# Patient Record
Sex: Female | Born: 1990 | Race: Black or African American | Hispanic: No | Marital: Married | Smoking: Never smoker
Health system: Southern US, Community
[De-identification: ages and names within clinical notes are randomized; demographics above are authoritative.]

## PROBLEM LIST (undated history)

## (undated) DIAGNOSIS — R51 Headache: Secondary | ICD-10-CM

## (undated) DIAGNOSIS — R519 Headache, unspecified: Secondary | ICD-10-CM

## (undated) DIAGNOSIS — M6208 Separation of muscle (nontraumatic), other site: Secondary | ICD-10-CM

## (undated) DIAGNOSIS — A64 Unspecified sexually transmitted disease: Secondary | ICD-10-CM

## (undated) HISTORY — DX: Separation of muscle (nontraumatic), other site: M62.08

## (undated) HISTORY — PX: NO PAST SURGERIES: SHX2092

## (undated) HISTORY — DX: Unspecified sexually transmitted disease: A64

---

## 2017-05-16 DIAGNOSIS — A64 Unspecified sexually transmitted disease: Secondary | ICD-10-CM | POA: Insufficient documentation

## 2017-05-16 HISTORY — DX: Unspecified sexually transmitted disease: A64

## 2017-05-16 LAB — OB RESULTS CONSOLE HEPATITIS B SURFACE ANTIGEN: Hepatitis B Surface Ag: NEGATIVE

## 2017-05-16 LAB — OB RESULTS CONSOLE ABO/RH: RH TYPE: POSITIVE

## 2017-05-16 LAB — OB RESULTS CONSOLE GC/CHLAMYDIA
Chlamydia: POSITIVE
Gonorrhea: NEGATIVE

## 2017-05-16 LAB — OB RESULTS CONSOLE RPR: RPR: NONREACTIVE

## 2017-05-16 LAB — OB RESULTS CONSOLE HIV ANTIBODY (ROUTINE TESTING): HIV: NONREACTIVE

## 2017-05-16 LAB — OB RESULTS CONSOLE ANTIBODY SCREEN: Antibody Screen: NEGATIVE

## 2017-05-16 LAB — OB RESULTS CONSOLE RUBELLA ANTIBODY, IGM: RUBELLA: IMMUNE

## 2017-07-14 ENCOUNTER — Ambulatory Visit (INDEPENDENT_AMBULATORY_CARE_PROVIDER_SITE_OTHER): Payer: BLUE CROSS/BLUE SHIELD | Admitting: Certified Nurse Midwife

## 2017-07-14 ENCOUNTER — Encounter: Payer: Self-pay | Admitting: Certified Nurse Midwife

## 2017-07-14 VITALS — BP 108/72 | HR 61 | Ht 66.0 in | Wt 157.7 lb

## 2017-07-14 DIAGNOSIS — Z8619 Personal history of other infectious and parasitic diseases: Secondary | ICD-10-CM

## 2017-07-14 DIAGNOSIS — Z3402 Encounter for supervision of normal first pregnancy, second trimester: Secondary | ICD-10-CM

## 2017-07-14 LAB — POCT URINALYSIS DIPSTICK
BILIRUBIN UA: NEGATIVE
Blood, UA: NEGATIVE
GLUCOSE UA: NEGATIVE
KETONES UA: NEGATIVE
Leukocytes, UA: NEGATIVE
Nitrite, UA: NEGATIVE
Protein, UA: NEGATIVE
Urobilinogen, UA: 0.2 E.U./dL
pH, UA: 7.5 (ref 5.0–8.0)

## 2017-07-14 NOTE — Progress Notes (Signed)
TRANSFER IN OB HISTORY AND PHYSICAL  SUBJECTIVE:       Tara Baldwin is a 26 y.o. No obstetric history on file. female, Patient's last menstrual period was 02/06/2017., Estimated Date of Delivery: None noted., Unknown, presents today for Transition of Prenatal Care. EPIC data migration from outside records is not accomplished today. Pt informed to sign release of medical records.  Complaints today include: none      Gynecologic History Patient's last menstrual period was 02/06/2017. Normal , pt has history of irregular cycles q 6 wks.  Contraception: none Last Pap: 2017. Results were: normal, per pt. No records on file   Obstetric History OB History  Gravida Para Term Preterm AB Living  1         0  SAB TAB Ectopic Multiple Live Births          0    # Outcome Date GA Lbr Len/2nd Weight Sex Delivery Anes PTL Lv  1 Current               Past Medical History:  Diagnosis Date  . STD (female) 05/16/2017   chlmydia   She denies any significant medical history   History reviewed. No pertinent surgical history.  No current outpatient prescriptions on file prior to visit.   No current facility-administered medications on file prior to visit.     No Known Allergies  Social History   Social History  . Marital status: Single    Spouse name: N/A  . Number of children: N/A  . Years of education: N/A   Occupational History  . Not on file.   Social History Main Topics  . Smoking status: Never Smoker  . Smokeless tobacco: Never Used  . Alcohol use No  . Drug use: No  . Sexual activity: Yes   Other Topics Concern  . Not on file   Social History Narrative  . No narrative on file    Family History  Problem Relation Age of Onset  . Cancer Paternal Grandmother        Breast  . Diabetes Neg Hx     The following portions of the patient's history were reviewed and updated as appropriate: allergies, current medications, past OB history, past medical history, past  surgical history, past family history, past social history, and problem list.    OBJECTIVE: Initial Physical Exam (New OB)  GENERAL APPEARANCE: alert, well appearing, in no apparent distress, oriented to person, place and time HEAD: normocephalic, atraumatic MOUTH: mucous membranes moist, pharynx normal without lesions THYROID: no thyromegaly or masses present BREASTS: no masses noted, no significant tenderness, no palpable axillary nodes, no skin changes LUNGS: clear to auscultation, no wheezes, rales or rhonchi, symmetric air entry HEART: regular rate and rhythm, no murmurs ABDOMEN: soft, nontender, nondistended, no abnormal masses, no epigastric pain and fundus soft, nontender just below umbilicus EXTREMITIES: no redness or tenderness in the calves or thighs, no edema, no limitation in range of motion, intact peripheral pulses SKIN: normal coloration and turgor, no rashes LYMPH NODES: no adenopathy palpable NEUROLOGIC: alert, oriented, normal speech, no focal findings or movement disorder noted  PELVIC EXAM EXTERNAL GENITALIA: normal appearing vulva with no masses, tenderness or lesions VAGINA: No speculum exam  CERVIX: cervical motion tenderness UTERUS: gravid ADNEXA: no masses palpable and nontender OB EXAM PELVIMETRY: appears adequate RECTUM: exam not indicated  ASSESSMENT: Normal pregnancy  PLAN: Pt states at one ultrasound done in September she was told that she was further along and  that by that estimate she was 19 wks. Ultrasound for dating ordered. Requesting medical records. She is feeling fetal movement. Fundus just below umbilicus. She will follow up for U/s this week . ROB in 4 wks.   Doreene Burke, CNM

## 2017-07-14 NOTE — Progress Notes (Signed)
New OB transfer from [place], declines flu vaccine, last pap 2017 wnl, positive for chlamydia in August of 2018 and treated in August 2018, [facility] Negative Sept 2018

## 2017-07-14 NOTE — Patient Instructions (Addendum)
Prenatal Care WHAT IS PRENATAL CARE? Prenatal care is the process of caring for a pregnant woman before she gives birth. Prenatal care makes sure that she and her baby remain as healthy as possible throughout pregnancy. Prenatal care may be provided by a midwife, family practice health care provider, or a childbirth and pregnancy specialist (obstetrician). Prenatal care may include physical examinations, testing, treatments, and education on nutrition, lifestyle, and social support services. WHY IS PRENATAL CARE SO IMPORTANT? Early and consistent prenatal care increases the chance that you and your baby will remain healthy throughout your pregnancy. This type of care also decreases a baby's risk of being born too early (prematurely), or being born smaller than expected (small for gestational age). Any underlying medical conditions you may have that could pose a risk during your pregnancy are discussed during prenatal care visits. You will also be monitored regularly for any new conditions that may arise during your pregnancy so they can be treated quickly and effectively. WHAT HAPPENS DURING PRENATAL CARE VISITS? Prenatal care visits may include the following: Discussion Tell your health care provider about any new signs or symptoms you have experienced since your last visit. These might include:  Nausea or vomiting.  Increased or decreased level of energy.  Difficulty sleeping.  Back or leg pain.  Weight changes.  Frequent urination.  Shortness of breath with physical activity.  Changes in your skin, such as the development of a rash or itchiness.  Vaginal discharge or bleeding.  Feelings of excitement or nervousness.  Changes in your baby's movements.  You may want to write down any questions or topics you want to discuss with your health care provider and bring them with you to your appointment. Examination During your first prenatal care visit, you will likely have a complete  physical exam. Your health care provider will often examine your vagina, cervix, and the position of your uterus, as well as check your heart, lungs, and other body systems. As your pregnancy progresses, your health care provider will measure the size of your uterus and your baby's position inside your uterus. He or she may also examine you for early signs of labor. Your prenatal visits may also include checking your blood pressure and, after about 10-12 weeks of pregnancy, listening to your baby's heartbeat. Testing Regular testing often includes:  Urinalysis. This checks your urine for glucose, protein, or signs of infection.  Blood count. This checks the levels of white and red blood cells in your body.  Tests for sexually transmitted infections (STIs). Testing for STIs at the beginning of pregnancy is routinely done and is required in many states.  Antibody testing. You will be checked to see if you are immune to certain illnesses, such as rubella, that can affect a developing fetus.  Glucose screen. Around 24-28 weeks of pregnancy, your blood glucose level will be checked for signs of gestational diabetes. Follow-up tests may be recommended.  Group B strep. This is a bacteria that is commonly found inside a woman's vagina. This test will inform your health care provider if you need an antibiotic to reduce the amount of this bacteria in your body prior to labor and childbirth.  Ultrasound. Many pregnant women undergo an ultrasound screening around 18-20 weeks of pregnancy to evaluate the health of the fetus and check for any developmental abnormalities.  HIV (human immunodeficiency virus) testing. Early in your pregnancy, you will be screened for HIV. If you are at high risk for HIV, this test may   be repeated during your third trimester of pregnancy.  You may be offered other testing based on your age, personal or family medical history, or other factors. HOW OFTEN SHOULD I PLAN TO SEE MY  HEALTH CARE PROVIDER FOR PRENATAL CARE? Your prenatal care check-up schedule depends on any medical conditions you have before, or develop during, your pregnancy. If you do not have any underlying medical conditions, you will likely be seen for checkups:  Monthly, during the first 6 months of pregnancy.  Twice a month during months 7 and 8 of pregnancy.  Weekly starting in the 9th month of pregnancy and until delivery.  If you develop signs of early labor or other concerning signs or symptoms, you may need to see your health care provider more often. Ask your health care provider what prenatal care schedule is best for you. WHAT CAN I DO TO KEEP MYSELF AND MY BABY AS HEALTHY AS POSSIBLE DURING MY PREGNANCY?  Take a prenatal vitamin containing 400 micrograms (0.4 mg) of folic acid every day. Your health care provider may also ask you to take additional vitamins such as iodine, vitamin D, iron, copper, and zinc.  Take 1500-2000 mg of calcium daily starting at your 20th week of pregnancy until you deliver your baby.  Make sure you are up to date on your vaccinations. Unless directed otherwise by your health care provider: ? You should receive a tetanus, diphtheria, and pertussis (Tdap) vaccination between the 27th and 36th week of your pregnancy, regardless of when your last Tdap immunization occurred. This helps protect your baby from whooping cough (pertussis) after he or she is born. ? You should receive an annual inactivated influenza vaccine (IIV) to help protect you and your baby from influenza. This can be done at any point during your pregnancy.  Eat a well-rounded diet that includes: ? Fresh fruits and vegetables. ? Lean proteins. ? Calcium-rich foods such as milk, yogurt, hard cheeses, and dark, leafy greens. ? Whole grain breads.  Do noteat seafood high in mercury, including: ? Swordfish. ? Tilefish. ? Shark. ? King mackerel. ? More than 6 oz tuna per week.  Do not  eat: ? Raw or undercooked meats or eggs. ? Unpasteurized foods, such as soft cheeses (brie, blue, or feta), juices, and milks. ? Lunch meats. ? Hot dogs that have not been heated until they are steaming.  Drink enough water to keep your urine clear or pale yellow. For many women, this may be 10 or more 8 oz glasses of water each day. Keeping yourself hydrated helps deliver nutrients to your baby and may prevent the start of pre-term uterine contractions.  Do not use any tobacco products including cigarettes, chewing tobacco, or electronic cigarettes. If you need help quitting, ask your health care provider.  Do not drink beverages containing alcohol. No safe level of alcohol consumption during pregnancy has been determined.  Do not use any illegal drugs. These can harm your developing baby or cause a miscarriage.  Ask your health care provider or pharmacist before taking any prescription or over-the-counter medicines, herbs, or supplements.  Limit your caffeine intake to no more than 200 mg per day.  Exercise. Unless told otherwise by your health care provider, try to get 30 minutes of moderate exercise most days of the week. Do not  do high-impact activities, contact sports, or activities with a high risk of falling, such as horseback riding or downhill skiing.  Get plenty of rest.  Avoid anything that raises your  body temperature, such as hot tubs and saunas.  If you own a cat, do not empty its litter box. Bacteria contained in cat feces can cause an infection called toxoplasmosis. This can result in serious harm to the fetus.  Stay away from chemicals such as insecticides, lead, mercury, and cleaning or paint products that contain solvents.  Do not have any X-rays taken unless medically necessary.  Take a childbirth and breastfeeding preparation class. Ask your health care provider if you need a referral or recommendation.  This information is not intended to replace advice given  to you by your health care provider. Make sure you discuss any questions you have with your health care provider. Document Released: 09/26/2003 Document Revised: 02/26/2016 Document Reviewed: 12/08/2013 Elsevier Interactive Patient Education  2017 [employer] Common Medications Safe in Pregnancy  Acne:      Constipation:  Benzoyl Peroxide     Colace  Clindamycin      Dulcolax Suppository  Topica Erythromycin     Fibercon  Salicylic Acid      Metamucil         Miralax AVOID:        Senakot   Accutane    Cough:  Retin-A       Cough Drops  Tetracycline      Phenergan w/ Codeine if Rx  Minocycline      Robitussin (Plain & DM)  Antibiotics:     Crabs/Lice:  Ceclor       RID  Cephalosporins    AVOID:  E-Mycins      Kwell  Keflex  Macrobid/Macrodantin   Diarrhea:  Penicillin      Kao-Pectate  Zithromax      Imodium AD         PUSH FLUIDS AVOID:       Cipro     Fever:  Tetracycline      Tylenol (Regular or Extra  Minocycline       Strength)  Levaquin      Extra Strength-Do not          Exceed 8 tabs/24 hrs Caffeine:        <200mg/day (equiv. To 1 cup of coffee or  approx. 3 12 oz sodas)         Gas: Cold/Hayfever:       Gas-X  Benadryl      Mylicon  Claritin       Phazyme  **Claritin-D        Chlor-Trimeton    Headaches:  Dimetapp      ASA-Free Excedrin  Drixoral-Non-Drowsy     Cold Compress  Mucinex (Guaifenasin)     Tylenol (Regular or Extra  Sudafed/Sudafed-12 Hour     Strength)  **Sudafed PE Pseudoephedrine   Tylenol Cold & Sinus     Vicks Vapor Rub  Zyrtec  **AVOID if Problems With Blood Pressure         Heartburn: Avoid lying down for at least 1 hour after meals  Aciphex      Maalox     Rash:  Milk of Magnesia     Benadryl    Mylanta       1% Hydrocortisone Cream  Pepcid  Pepcid Complete   Sleep Aids:  Prevacid      Ambien   Prilosec       Benadryl  Rolaids       Chamomile Tea  Tums (Limit 4/day)     Unisom  Zantac         Tylenol PM         Warm  milk-add vanilla or  Hemorrhoids:       Sugar for taste  Anusol/Anusol H.C.  (RX: Analapram 2.5%)  Sugar Substitutes:  Hydrocortisone OTC     Ok in moderation  Preparation H      Tucks        Vaseline lotion applied to tissue with wiping    Herpes:     Throat:  Acyclovir      Oragel  Famvir  Valtrex     Vaccines:         Flu Shot Leg Cramps:       *Gardasil  Benadryl      Hepatitis A         Hepatitis B Nasal Spray:       Pneumovax  Saline Nasal Spray     Polio Booster         Tetanus Nausea:       Tuberculosis test or PPD  Vitamin B6 25 mg TID   AVOID:    Dramamine      *Gardasil  Emetrol       Live Poliovirus  Ginger Root 250 mg QID    MMR (measles, mumps &  High Complex Carbs @ Bedtime    rebella)  Sea Bands-Accupressure    Varicella (Chickenpox)  Unisom 1/2 tab TID     *No known complications           If received before Pain:         Known pregnancy;   Darvocet       Resume series after  Lortab        Delivery  Percocet    Yeast:   Tramadol      Femstat  Tylenol 3      Gyne-lotrimin  Ultram       Monistat  Vicodin           MISC:         All Sunscreens           Hair Coloring/highlights          Insect Repellant's          (Including DEET)         [facility] wWaterbirth Class  March 19, 2017  Wednesday 7:00p - 9:00p  [place], [place]  April 23, 2017  Wednesday 7:00p - 9:00p [place], [place]    May 28, 2017   Wednesday [place], [place]  June 25, 2017  Wednesday  7:00p - 9:00p [place], [place]  July 23, 2017 Wednesday [place], [place]  Interested in a waterbirth?  This informational class will help you discover whether waterbirth is the right fit for you.  Education about waterbirth itself, supplies you would need and how to assemble your support team is what  you can expect from this class.  Some obstetrical practices require this class in order to pursue a waterbirth.  (Not all obstetrical practices offer waterbirth check with your healthcare provider)  Register only the expectant mom, but you are encouraged to bring your partner to class!  Fees & Payment No fee  Register Online [email or web address] Search Doren Custard

## 2017-07-15 ENCOUNTER — Ambulatory Visit (INDEPENDENT_AMBULATORY_CARE_PROVIDER_SITE_OTHER): Payer: BLUE CROSS/BLUE SHIELD

## 2017-07-15 DIAGNOSIS — Z3402 Encounter for supervision of normal first pregnancy, second trimester: Secondary | ICD-10-CM | POA: Diagnosis not present

## 2017-07-16 LAB — GC/CHLAMYDIA PROBE AMP
Chlamydia trachomatis, NAA: NEGATIVE
NEISSERIA GONORRHOEAE BY PCR: NEGATIVE

## 2017-07-30 ENCOUNTER — Encounter: Payer: Self-pay | Admitting: Certified Nurse Midwife

## 2017-07-30 ENCOUNTER — Ambulatory Visit (INDEPENDENT_AMBULATORY_CARE_PROVIDER_SITE_OTHER): Payer: BLUE CROSS/BLUE SHIELD | Admitting: Certified Nurse Midwife

## 2017-07-30 VITALS — BP 106/63 | HR 69 | Wt 163.4 lb

## 2017-07-30 DIAGNOSIS — Z3402 Encounter for supervision of normal first pregnancy, second trimester: Secondary | ICD-10-CM

## 2017-07-30 LAB — POCT URINALYSIS DIPSTICK
Bilirubin, UA: NEGATIVE
GLUCOSE UA: NEGATIVE
Ketones, UA: NEGATIVE
LEUKOCYTES UA: NEGATIVE
Nitrite, UA: NEGATIVE
PROTEIN UA: NEGATIVE
SPEC GRAV UA: 1.02 (ref 1.010–1.025)
UROBILINOGEN UA: 0.2 U/dL
pH, UA: 6 (ref 5.0–8.0)

## 2017-07-30 NOTE — Progress Notes (Signed)
Pt presents today for problem visit. She states that she started to experience abdominal pain yesterday afternoon around 2 pm. Initially the pain was at the top of her stomach, she states that it felt like she was having a had time food settling and slow to go down but this morning she was having sharp right lower quadrant pain that radiated to her back. She denies Nausea, vomiting, fever, epigastric pain. She denies having pain after eating, her abdomen palpates soft, negative for CVA tenderness and she denies contractions and endorses good fetal movement.  We reviewed indigestion and round ligament pain. Discussed self help measures and tylenol. She verbalizes understanding and agrees to plan. Red flag symptoms reviewed. Follow up as scheduled Nov. 6rh.   Doreene BurkeAnnie Kagan Hietpas, CNM

## 2017-07-30 NOTE — Progress Notes (Signed)
ROB- Pt here today c/o sharp pain on her upper abdominal area, states it would come and go, sx started yesterday, she states it subsided last night but she woke up this am abdominal pain is now on her lower right side extending to back, Denies vaginal bleeding, pelvic pain or pressure

## 2017-07-30 NOTE — Patient Instructions (Signed)

## 2017-08-12 ENCOUNTER — Ambulatory Visit (INDEPENDENT_AMBULATORY_CARE_PROVIDER_SITE_OTHER): Payer: BLUE CROSS/BLUE SHIELD | Admitting: Obstetrics and Gynecology

## 2017-08-12 VITALS — BP 110/56 | HR 61 | Wt 167.5 lb

## 2017-08-12 DIAGNOSIS — Z3492 Encounter for supervision of normal pregnancy, unspecified, second trimester: Secondary | ICD-10-CM

## 2017-08-12 NOTE — Progress Notes (Signed)
ROB- doing well. Flu vaccine given.

## 2017-08-12 NOTE — Progress Notes (Signed)
ROB- pt is doing well 

## 2017-08-19 NOTE — Progress Notes (Signed)
Medical records received from [facility] , [place] . 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   To be scanned into chart.  Doreene BurkeAnnie Suzetta Baldwin, CNM

## 2017-09-11 ENCOUNTER — Ambulatory Visit (INDEPENDENT_AMBULATORY_CARE_PROVIDER_SITE_OTHER): Payer: BLUE CROSS/BLUE SHIELD | Admitting: Certified Nurse Midwife

## 2017-09-11 ENCOUNTER — Encounter: Payer: Self-pay | Admitting: Certified Nurse Midwife

## 2017-09-11 ENCOUNTER — Other Ambulatory Visit: Payer: Self-pay

## 2017-09-11 VITALS — BP 106/59 | HR 55 | Wt 175.3 lb

## 2017-09-11 DIAGNOSIS — Z130 Encounter for screening for diseases of the blood and blood-forming organs and certain disorders involving the immune mechanism: Secondary | ICD-10-CM

## 2017-09-11 DIAGNOSIS — Z113 Encounter for screening for infections with a predominantly sexual mode of transmission: Secondary | ICD-10-CM

## 2017-09-11 DIAGNOSIS — Z131 Encounter for screening for diabetes mellitus: Secondary | ICD-10-CM

## 2017-09-11 DIAGNOSIS — Z3492 Encounter for supervision of normal pregnancy, unspecified, second trimester: Secondary | ICD-10-CM

## 2017-09-11 NOTE — Patient Instructions (Signed)
Cord Blood Banking Information Cord blood banking is the process of collecting and storing the blood that is in the umbilical cord and placenta at the time of delivery. This blood contains stem cells, which can be used to treat many blood diseases, immune system disorders, and childhood cancers. Stem cells can also be used to research certain diseases and treatments. Many people who choose cord blood banking donate the blood. Donated blood can be used in lifesaving treatments or for research. Other people choose to store the blood privately. Blood that is stored privately can only be used with the person's permission. This option is often chosen if:  A family member needs a stem cell transplant.  The child is part of an ethnic minority.  The child was conceived through in vitro fertilization.  What should I look for in a blood bank? A blood bank is the organization that coordinates cord blood banking. Make sure the cord blood bank that you use:  Is accredited.  Is financially stable.  Handles a large volume of cord blood samples.  Has a procedure in place for transport and storage.  Allows you the option of transferring your cord blood sample.  Has a procedure in place if the bank goes out of business.  Clearly states all costs and limits to future costs.  People who choose to donate cord blood should not need to pay for blood banking. People who keep the blood for private use will need to pay for the first (initial) storage and pay a fee each year (annual fee). Other fees may also apply. What are the risks of cord blood banking? There are no health risks associated with cord blood banking. It is considered safe. How should I prepare? You must schedule this process at least 4-6 weeks before you will be giving birth. How is the blood collected? The blood is collected as soon as the baby has been delivered. Within 15 minutes of delivery, a health care provider will take these actions  to collect the blood:  Clamp the umbilical cord at the top and bottom. This traps the blood in the umbilical cord.  Use a syringe or bag to collect the blood.  Insert needles into the placenta to collect (draw out) more blood.  What happens after the blood is collected? After the blood has been collected:  The blood will be sent to a blood bank.  The blood will be tested for genetic problems and infectious diseases. If the blood tests positive for a genetic problem or a disease, someone will contact you and let you know.  The blood will be frozen.  If your child develops a genetic condition, immune system disorder, or cancer, you will be responsible for contacting the blood bank and letting them know. This information is not intended to replace advice given to you by your health care provider. Make sure you discuss any questions you have with your health care provider. Document Released: 03/13/2010 Document Revised: 02/29/2016 Document Reviewed: 03/13/2015 Elsevier Interactive Patient Education  2018 [employer]. Pain Relief During Labor and Delivery Many things can cause pain during labor and delivery, including:  Pressure on bones and ligaments due to the baby moving through the pelvis.  Stretching of tissues due to the baby moving through the birth canal.  Muscle tension due to anxiety or nervousness.  The uterus tightening (contracting) and relaxing to help move the baby.  There are many ways to deal with the pain of labor and delivery. They  include:  Taking prenatal classes. Taking these classes helps you know what to expect during your baby's birth. What you learn will increase your confidence and decrease your anxiety.  Practicing relaxation techniques or doing relaxing activities, such as: ? Focused breathing. ? Meditation. ? Visualization. ? Aroma therapy. ? Listening to your favorite music. ? Hypnosis.  Taking a warm shower or bath (hydrotherapy). This  may: ? Provide comfort and relaxation. ? Lessen your perception of pain. ? Decrease the amount of pain medicine needed. ? Decrease the length of labor.  Getting a massage or counterpressure on your back.  Applying warm packs or ice packs.  Changing positions often, moving around, or using a birthing ball.  Getting: ? Pain medicine through an IV or injection into a muscle. ? Pain medicine inserted into your spinal column. ? Injections of sterile water just under the skin on your lower back (intradermal injections). ? Laughing gas (nitrous oxide).  Discuss your pain control options with your health care provider during your prenatal visits. Explore the options offered by your hospital or birth center. What kinds of medicine are available? There are two kinds of medicines that can be used to relieve pain during labor and delivery:  Analgesics. These medicines decrease pain without causing you to lose feeling or the ability to move your muscles.  Anesthetics. These medicines block feeling in the body and can decrease your ability to move freely.  Both of these kinds of medicine can cause minor side effects, such as nausea, trouble concentrating, and sleepiness. They can also decrease the baby's heart rate before birth and affect the baby's breathing rate after birth. For this reason, health care providers are careful about when and how much medicine is given. What are specific medicines and procedures that provide pain relief? Local Anesthetics Local anesthetics are used to numb a small area of the body. They may be used along with another kind of anesthetic or used to numb the nerves of the vagina, cervix, and perineum during the second stage of labor. General Anesthetics General anesthetics cause you to lose consciousness so you do not feel pain. They are usually only used for an emergency cesarean delivery. General anesthetics are given through an IV tube and a mask. Pudendal Block A  pudendal block is a form of local anesthetic. It may be used to relieve the pain associated with pushing or stretching of the perineum at the time of delivery or to further numb the perineum. A pudendal block is done by injecting numbing medicine through the vaginal wall into a nerve in the pelvis. Epidural Analgesia Epidural analgesia is given through a flexible IV catheter that is inserted into the lower back. Numbing medicine is delivered continuously to the area near your spinal column nerves (epidural space). After having this type of analgesia, you may be able to move your legs but you most likely will not be able to walk. Depending on the amount of medicine given, you may lose all feeling in the lower half of your body, or you may retain some level of sensation, including the urge to push. Epidural analgesia can be used to provide pain relief for a vaginal birth. Spinal Block A spinal block is similar to epidural analgesia, but the medicine is injected into the spinal fluid instead of the epidural space. A spinal block is only given once. It starts to relieve pain quickly, but the pain relief lasts only 1-6 hours. Spinal blocks can be used for cesarean deliveries. Combined  Spinal-Epidural (CSE) Block A CSE block combines the effects of a spinal block and epidural analgesia. The spinal block works quickly to block all pain. The epidural analgesia provides continuous pain relief, even after the effects of the spinal block have worn off. This information is not intended to replace advice given to you by your health care provider. Make sure you discuss any questions you have with your health care provider. Document Released: 01/09/2009 Document Revised: 03/01/2016 Document Reviewed: 02/14/2016 Elsevier Interactive Patient Education  2018 [employer]. Breastfeeding Deciding to breastfeed is one of the best choices you can make for you and your baby. A change in hormones during pregnancy causes your  breast tissue to grow and increases the number and size of your milk ducts. These hormones also allow proteins, sugars, and fats from your blood supply to make breast milk in your milk-producing glands. Hormones prevent breast milk from being released before your baby is born as well as prompt milk flow after birth. Once breastfeeding has begun, thoughts of your baby, as well as his or her sucking or crying, can stimulate the release of milk from your milk-producing glands. Benefits of breastfeeding For Your Baby  Your first milk (colostrum) helps your baby's digestive system function better.  There are antibodies in your milk that help your baby fight off infections.  Your baby has a lower incidence of asthma, allergies, and sudden infant death syndrome.  The nutrients in breast milk are better for your baby than infant formulas and are designed uniquely for your baby's needs.  Breast milk improves your baby's brain development.  Your baby is less likely to develop other conditions, such as childhood obesity, asthma, or type 2 diabetes mellitus.  For You  Breastfeeding helps to create a very special bond between you and your baby.  Breastfeeding is convenient. Breast milk is always available at the correct temperature and costs nothing.  Breastfeeding helps to burn calories and helps you lose the weight gained during pregnancy.  Breastfeeding makes your uterus contract to its prepregnancy size faster and slows bleeding (lochia) after you give birth.  Breastfeeding helps to lower your risk of developing type 2 diabetes mellitus, osteoporosis, and breast or ovarian cancer later in life.  Signs that your baby is hungry Early Signs of Hunger  Increased alertness or activity.  Stretching.  Movement of the head from side to side.  Movement of the head and opening of the mouth when the corner of the mouth or cheek is stroked (rooting).  Increased sucking sounds, smacking lips,  cooing, sighing, or squeaking.  Hand-to-mouth movements.  Increased sucking of fingers or hands.  Late Signs of Hunger  Fussing.  Intermittent crying.  Extreme Signs of Hunger Signs of extreme hunger will require calming and consoling before your baby will be able to breastfeed successfully. Do not wait for the following signs of extreme hunger to occur before you initiate breastfeeding:  Restlessness.  A loud, strong cry.  Screaming.  Breastfeeding basics Breastfeeding Initiation  Find a comfortable place to sit or lie down, with your neck and back well supported.  Place a pillow or rolled up blanket under your baby to bring him or her to the level of your breast (if you are seated). Nursing pillows are specially designed to help support your arms and your baby while you breastfeed.  Make sure that your baby's abdomen is facing your abdomen.  Gently massage your breast. With your fingertips, massage from your chest wall toward your  nipple in a circular motion. This encourages milk flow. You may need to continue this action during the feeding if your milk flows slowly.  Support your breast with 4 fingers underneath and your thumb above your nipple. Make sure your fingers are well away from your nipple and your baby's mouth.  Stroke your baby's lips gently with your finger or nipple.  When your baby's mouth is open wide enough, quickly bring your baby to your breast, placing your entire nipple and as much of the colored area around your nipple (areola) as possible into your baby's mouth. ? More areola should be visible above your baby's upper lip than below the lower lip. ? Your baby's tongue should be between his or her lower gum and your breast.  Ensure that your baby's mouth is correctly positioned around your nipple (latched). Your baby's lips should create a seal on your breast and be turned out (everted).  It is common for your baby to suck about 2-3 minutes in order to  start the flow of breast milk.  Latching Teaching your baby how to latch on to your breast properly is very important. An improper latch can cause nipple pain and decreased milk supply for you and poor weight gain in your baby. Also, if your baby is not latched onto your nipple properly, he or she may swallow some air during feeding. This can make your baby fussy. Burping your baby when you switch breasts during the feeding can help to get rid of the air. However, teaching your baby to latch on properly is still the best way to prevent fussiness from swallowing air while breastfeeding. Signs that your baby has successfully latched on to your nipple:  Silent tugging or silent sucking, without causing you pain.  Swallowing heard between every 3-4 sucks.  Muscle movement above and in front of his or her ears while sucking.  Signs that your baby has not successfully latched on to nipple:  Sucking sounds or smacking sounds from your baby while breastfeeding.  Nipple pain.  If you think your baby has not latched on correctly, slip your finger into the corner of your baby's mouth to break the suction and place it between your baby's gums. Attempt breastfeeding initiation again. Signs of Successful Breastfeeding Signs from your baby:  A gradual decrease in the number of sucks or complete cessation of sucking.  Falling asleep.  Relaxation of his or her body.  Retention of a small amount of milk in his or her mouth.  Letting go of your breast by himself or herself.  Signs from you:  Breasts that have increased in firmness, weight, and size 1-3 hours after feeding.  Breasts that are softer immediately after breastfeeding.  Increased milk volume, as well as a change in milk consistency and color by the fifth day of breastfeeding.  Nipples that are not sore, cracked, or bleeding.  Signs That Your [employer] is Getting Enough Milk  Wetting at least 1-2 diapers during the first 24 hours after  birth.  Wetting at least 5-6 diapers every 24 hours for the first week after birth. The urine should be clear or pale yellow by 5 days after birth.  Wetting 6-8 diapers every 24 hours as your baby continues to grow and develop.  At least 3 stools in a 24-hour period by age 50 days. The stool should be soft and yellow.  At least 3 stools in a 24-hour period by age 17 days. The stool should be seedy  and yellow.  No loss of weight greater than 10% of birth weight during the first 20 days of age.  Average weight gain of 4-7 ounces (113-198 g) per week after age 72 days.  Consistent daily weight gain by age 65 days, without weight loss after the age of 2 weeks.  After a feeding, your baby may spit up a small amount. This is common. Breastfeeding frequency and duration Frequent feeding will help you make more milk and can prevent sore nipples and breast engorgement. Breastfeed when you feel the need to reduce the fullness of your breasts or when your baby shows signs of hunger. This is called "breastfeeding on demand." Avoid introducing a pacifier to your baby while you are working to establish breastfeeding (the first 4-6 weeks after your baby is born). After this time you may choose to use a pacifier. Research has shown that pacifier use during the first year of a baby's life decreases the risk of sudden infant death syndrome (SIDS). Allow your baby to feed on each breast as long as he or she wants. Breastfeed until your baby is finished feeding. When your baby unlatches or falls asleep while feeding from the first breast, offer the second breast. Because newborns are often sleepy in the first few weeks of life, you may need to awaken your baby to get him or her to feed. Breastfeeding times will vary from baby to baby. However, the following rules can serve as a guide to help you ensure that your baby is properly fed:  Newborns (babies 50 weeks of age or younger) may breastfeed every 1-3  hours.  Newborns should not go longer than 3 hours during the day or 5 hours during the night without breastfeeding.  You should breastfeed your baby a minimum of 8 times in a 24-hour period until you begin to introduce solid foods to your baby at around 75 months of age.  Breast milk pumping Pumping and storing breast milk allows you to ensure that your baby is exclusively fed your breast milk, even at times when you are unable to breastfeed. This is especially important if you are going back to work while you are still breastfeeding or when you are not able to be present during feedings. Your lactation consultant can give you guidelines on how long it is safe to store breast milk. A breast pump is a machine that allows you to pump milk from your breast into a sterile bottle. The pumped breast milk can then be stored in a refrigerator or freezer. Some breast pumps are operated by hand, while others use electricity. Ask your lactation consultant which type will work best for you. Breast pumps can be purchased, but some hospitals and breastfeeding support groups lease breast pumps on a monthly basis. A lactation consultant can teach you how to hand express breast milk, if you prefer not to use a pump. Caring for your breasts while you breastfeed Nipples can become dry, cracked, and sore while breastfeeding. The following recommendations can help keep your breasts moisturized and healthy:  Avoid using soap on your nipples.  Wear a supportive bra. Although not required, special nursing bras and tank tops are designed to allow access to your breasts for breastfeeding without taking off your entire bra or top. Avoid wearing underwire-style bras or extremely tight bras.  Air dry your nipples for 3-16mnutes after each feeding.  Use only cotton bra pads to absorb leaked breast milk. Leaking of breast milk between feedings is normal.  Use lanolin on your nipples after breastfeeding. Lanolin helps to  maintain your skin's normal moisture barrier. If you use pure lanolin, you do not need to wash it off before feeding your baby again. Pure lanolin is not toxic to your baby. You may also hand express a few drops of breast milk and gently massage that milk into your nipples and allow the milk to air dry.  In the first few weeks after giving birth, some women experience extremely full breasts (engorgement). Engorgement can make your breasts feel heavy, warm, and tender to the touch. Engorgement peaks within 3-5 days after you give birth. The following recommendations can help ease engorgement:  Completely empty your breasts while breastfeeding or pumping. You may want to start by applying warm, moist heat (in the shower or with warm water-soaked hand towels) just before feeding or pumping. This increases circulation and helps the milk flow. If your baby does not completely empty your breasts while breastfeeding, pump any extra milk after he or she is finished.  Wear a snug bra (nursing or regular) or tank top for 1-2 days to signal your body to slightly decrease milk production.  Apply ice packs to your breasts, unless this is too uncomfortable for you.  Make sure that your baby is latched on and positioned properly while breastfeeding.  If engorgement persists after 48 hours of following these recommendations, contact your health care provider or a Science writer. Overall health care recommendations while breastfeeding  Eat healthy foods. Alternate between meals and snacks, eating 3 of each per day. Because what you eat affects your breast milk, some of the foods may make your baby more irritable than usual. Avoid eating these foods if you are sure that they are negatively affecting your baby.  Drink milk, fruit juice, and water to satisfy your thirst (about 10 glasses a day).  Rest often, relax, and continue to take your prenatal vitamins to prevent fatigue, stress, and anemia.  Continue  breast self-awareness checks.  Avoid chewing and smoking tobacco. Chemicals from cigarettes that pass into breast milk and exposure to secondhand smoke may harm your baby.  Avoid alcohol and drug use, including marijuana. Some medicines that may be harmful to your baby can pass through breast milk. It is important to ask your health care provider before taking any medicine, including all over-the-counter and prescription medicine as well as vitamin and herbal supplements. It is possible to become pregnant while breastfeeding. If birth control is desired, ask your health care provider about options that will be safe for your baby. Contact a health care provider if:  You feel like you want to stop breastfeeding or have become frustrated with breastfeeding.  You have painful breasts or nipples.  Your nipples are cracked or bleeding.  Your breasts are red, tender, or warm.  You have a swollen area on either breast.  You have a fever or chills.  You have nausea or vomiting.  You have drainage other than breast milk from your nipples.  Your breasts do not become full before feedings by the fifth day after you give birth.  You feel sad and depressed.  Your baby is too sleepy to eat well.  Your baby is having trouble sleeping.  Your baby is wetting less than 3 diapers in a 24-hour period.  Your baby has less than 3 stools in a 24-hour period.  Your baby's skin or the white part of his or her eyes becomes yellow.  Your baby is not  gaining weight by 33 days of age. Get help right away if:  Your baby is overly tired (lethargic) and does not want to wake up and feed.  Your baby develops an unexplained fever. This information is not intended to replace advice given to you by your health care provider. Make sure you discuss any questions you have with your health care provider. Document Released: 09/23/2005 Document Revised: 03/06/2016 Document Reviewed: 03/17/2013 Elsevier  Interactive Patient Education  2017 [place]. Common Medications Safe in Pregnancy  Acne:      Constipation:  Benzoyl Peroxide     Colace  Clindamycin      Dulcolax Suppository  Topica Erythromycin     Fibercon  Salicylic Acid      Metamucil         Miralax AVOID:        Senakot   Accutane    Cough:  Retin-A       Cough Drops  Tetracycline      Phenergan w/ Codeine if Rx  Minocycline      Robitussin (Plain & DM)  Antibiotics:     Crabs/Lice:  Ceclor       RID  Cephalosporins    AVOID:  E-Mycins      Kwell  Keflex  Macrobid/Macrodantin   Diarrhea:  Penicillin      Kao-Pectate  Zithromax      Imodium AD         PUSH FLUIDS AVOID:       Cipro     Fever:  Tetracycline      Tylenol (Regular or Extra  Minocycline       Strength)  Levaquin      Extra Strength-Do not          Exceed 8 tabs/24 hrs Caffeine:        <287m/day (equiv. To 1 cup of coffee or  approx. 3 12 oz sodas)         Gas: Cold/Hayfever:       Gas-X  Benadryl      Mylicon  Claritin       Phazyme  **Claritin-D        Chlor-Trimeton    Headaches:  Dimetapp      ASA-Free Excedrin  Drixoral-Non-Drowsy     Cold Compress  Mucinex (Guaifenasin)     Tylenol (Regular or Extra  Sudafed/Sudafed-12 Hour     Strength)  **Sudafed PE Pseudoephedrine   Tylenol Cold & Sinus     Vicks Vapor Rub  Zyrtec  **AVOID if Problems With Blood Pressure         Heartburn: Avoid lying down for at least 1 hour after meals  Aciphex      Maalox     Rash:  Milk of Magnesia     Benadryl    Mylanta       1% Hydrocortisone Cream  Pepcid  Pepcid Complete   Sleep Aids:  Prevacid      Ambien   Prilosec       Benadryl  Rolaids       Chamomile Tea  Tums (Limit 4/day)     Unisom  Zantac       Tylenol PM         Warm milk-add vanilla or  Hemorrhoids:       Sugar for taste  Anusol/Anusol H.C.  (RX: Analapram 2.5%)  Sugar Substitutes:  Hydrocortisone OTC     Ok in moderation  Preparation H  Tucks        Vaseline lotion  applied to tissue with wiping    Herpes:     Throat:  Acyclovir      Oragel  Famvir  Valtrex     Vaccines:         Flu Shot Leg Cramps:       *Gardasil  Benadryl      Hepatitis A         Hepatitis B Nasal Spray:       Pneumovax  Saline Nasal Spray     Polio Booster         Tetanus Nausea:       Tuberculosis test or PPD  Vitamin B6 25 mg TID   AVOID:    Dramamine      *Gardasil  Emetrol       Live Poliovirus  Ginger Root 250 mg QID    MMR (measles, mumps &  High Complex Carbs @ Bedtime    rebella)  Sea Bands-Accupressure    Varicella (Chickenpox)  Unisom 1/2 tab TID     *No known complications           If received before Pain:         Known pregnancy;   Darvocet       Resume series after  Lortab        Delivery  Percocet    Yeast:   Tramadol      Femstat  Tylenol 3      Gyne-lotrimin  Ultram       Monistat  Vicodin           MISC:         All Sunscreens           Hair Coloring/highlights          Insect Repellant's          (Including DEET)         Mystic Tans Third Trimester of Pregnancy The third trimester is from week 29 through week 42, months 7 through 9. This trimester is when your unborn baby (fetus) is growing very fast. At the end of the ninth month, the unborn baby is about 20 inches in length. It weighs about 6-10 pounds. Follow these instructions at home:  Avoid all smoking, herbs, and alcohol. Avoid drugs not approved by your doctor.  Do not use any tobacco products, including cigarettes, chewing tobacco, and electronic cigarettes. If you need help quitting, ask your doctor. You may get counseling or other support to help you quit.  Only take medicine as told by your doctor. Some medicines are safe and some are not during pregnancy.  Exercise only as told by your doctor. Stop exercising if you start having cramps.  Eat regular, healthy meals.  Wear a good support bra if your breasts are tender.  Do not use hot tubs, steam rooms, or saunas.  Wear your  seat belt when driving.  Avoid raw meat, uncooked cheese, and liter boxes and soil used by cats.  Take your prenatal vitamins.  Take 1500-2000 milligrams of calcium daily starting at the 20th week of pregnancy until you deliver your baby.  Try taking medicine that helps you poop (stool softener) as needed, and if your doctor approves. Eat more fiber by eating fresh fruit, vegetables, and whole grains. Drink enough fluids to keep your pee (urine) clear or pale yellow.  Take warm water baths (sitz baths) to soothe pain or discomfort caused by  hemorrhoids. Use hemorrhoid cream if your doctor approves.  If you have puffy, bulging veins (varicose veins), wear support hose. Raise (elevate) your feet for 15 minutes, 3-4 times a day. Limit salt in your diet.  Avoid heavy lifting, wear low heels, and sit up straight.  Rest with your legs raised if you have leg cramps or low back pain.  Visit your dentist if you have not gone during your pregnancy. Use a soft toothbrush to brush your teeth. Be gentle when you floss.  You can have sex (intercourse) unless your doctor tells you not to.  Do not travel far distances unless you must. Only do so with your doctor's approval.  Take prenatal classes.  Practice driving to the hospital.  Pack your hospital bag.  Prepare the baby's room.  Go to your doctor visits. Get help if:  You are not sure if you are in labor or if your water has broken.  You are dizzy.  You have mild cramps or pressure in your lower belly (abdominal).  You have a nagging pain in your belly area.  You continue to feel sick to your stomach (nauseous), throw up (vomit), or have watery poop (diarrhea).  You have bad smelling fluid coming from your vagina.  You have pain with peeing (urination). Get help right away if:  You have a fever.  You are leaking fluid from your vagina.  You are spotting or bleeding from your vagina.  You have severe belly cramping or  pain.  You lose or gain weight rapidly.  You have trouble catching your breath and have chest pain.  You notice sudden or extreme puffiness (swelling) of your face, hands, ankles, feet, or legs.  You have not felt the baby move in over an hour.  You have severe headaches that do not go away with medicine.  You have vision changes. This information is not intended to replace advice given to you by your health care provider. Make sure you discuss any questions you have with your health care provider. Document Released: 12/18/2009 Document Revised: 02/29/2016 Document Reviewed: 11/24/2012 Elsevier Interactive Patient Education  2017 [employer].

## 2017-09-11 NOTE — Progress Notes (Signed)
ROB-Reports lower back pain and intermittent pelvic pressure. Discussed home treatment measures including use of abdominal support, handout given. Blood transfusion consent reviewed and signed. Education: pediatrician selection, intrapartum pain management options, and postpartum contraception techniques; pamphlets and booklets given. Anticipatory guidance regarding course of prenatal care including testing, vaccines, and visit schedule. Reviewed red flag symptoms and when to call. RTC x 2 weeks for 28 week labs, TDaP, and ROB with Pattricia Bossnnie or sooner if needed.

## 2017-09-24 ENCOUNTER — Encounter: Payer: Self-pay | Admitting: Certified Nurse Midwife

## 2017-09-24 ENCOUNTER — Ambulatory Visit (INDEPENDENT_AMBULATORY_CARE_PROVIDER_SITE_OTHER): Payer: BLUE CROSS/BLUE SHIELD | Admitting: Certified Nurse Midwife

## 2017-09-24 ENCOUNTER — Other Ambulatory Visit: Payer: BLUE CROSS/BLUE SHIELD

## 2017-09-24 VITALS — BP 112/71 | HR 72 | Wt 178.9 lb

## 2017-09-24 DIAGNOSIS — Z23 Encounter for immunization: Secondary | ICD-10-CM

## 2017-09-24 DIAGNOSIS — Z113 Encounter for screening for infections with a predominantly sexual mode of transmission: Secondary | ICD-10-CM

## 2017-09-24 DIAGNOSIS — Z3403 Encounter for supervision of normal first pregnancy, third trimester: Secondary | ICD-10-CM

## 2017-09-24 LAB — POCT URINALYSIS DIPSTICK
Bilirubin, UA: NEGATIVE
Blood, UA: NEGATIVE
Glucose, UA: NEGATIVE
Ketones, UA: NEGATIVE
LEUKOCYTES UA: NEGATIVE
NITRITE UA: NEGATIVE
PH UA: 6.5 (ref 5.0–8.0)
PROTEIN UA: NEGATIVE
Spec Grav, UA: 1.025 (ref 1.010–1.025)
UROBILINOGEN UA: 0.2 U/dL

## 2017-09-24 NOTE — Progress Notes (Signed)
ROB, doing well. TDAP/CBC/RPR/1 hr GTT today. Discussed cord blood donation ( public & private)  And birth control after baby discussed. Pamphlet given. Will follow up with result. ROB in 2 wks.   Doreene BurkeAnnie Demitrious Mccannon, CNM

## 2017-09-24 NOTE — Progress Notes (Signed)
ROB- Pt states she is doing well, discuss position of baby

## 2017-09-24 NOTE — Patient Instructions (Signed)
Cord Blood Banking Information Cord blood banking is the process of collecting and storing the blood that is in the umbilical cord and placenta at the time of delivery. This blood contains stem cells, which can be used to treat many blood diseases, immune system disorders, and childhood cancers. Stem cells can also be used to research certain diseases and treatments. Many people who choose cord blood banking donate the blood. Donated blood can be used in lifesaving treatments or for research. Other people choose to store the blood privately. Blood that is stored privately can only be used with the person's permission. This option is often chosen if:  A family member needs a stem cell transplant.  The child is part of an ethnic minority.  The child was conceived through in vitro fertilization.  What should I look for in a blood bank? A blood bank is the organization that coordinates cord blood banking. Make sure the cord blood bank that you use:  Is accredited.  Is financially stable.  Handles a large volume of cord blood samples.  Has a procedure in place for transport and storage.  Allows you the option of transferring your cord blood sample.  Has a procedure in place if the bank goes out of business.  Clearly states all costs and limits to future costs.  People who choose to donate cord blood should not need to pay for blood banking. People who keep the blood for private use will need to pay for the first (initial) storage and pay a fee each year (annual fee). Other fees may also apply. What are the risks of cord blood banking? There are no health risks associated with cord blood banking. It is considered safe. How should I prepare? You must schedule this process at least 4-6 weeks before you will be giving birth. How is the blood collected? The blood is collected as soon as the baby has been delivered. Within 15 minutes of delivery, a health care provider will take these actions  to collect the blood:  Clamp the umbilical cord at the top and bottom. This traps the blood in the umbilical cord.  Use a syringe or bag to collect the blood.  Insert needles into the placenta to collect (draw out) more blood.  What happens after the blood is collected? After the blood has been collected:  The blood will be sent to a blood bank.  The blood will be tested for genetic problems and infectious diseases. If the blood tests positive for a genetic problem or a disease, someone will contact you and let you know.  The blood will be frozen.  If your child develops a genetic condition, immune system disorder, or cancer, you will be responsible for contacting the blood bank and letting them know. This information is not intended to replace advice given to you by your health care provider. Make sure you discuss any questions you have with your health care provider. Document Released: 03/13/2010 Document Revised: 02/29/2016 Document Reviewed: 03/13/2015 Elsevier Interactive Patient Education  2018 [employer] Glucose Tolerance Test During Pregnancy The glucose tolerance test (GTT) is a blood test used to determine if you have developed a type of diabetes during pregnancy (gestational diabetes). This is when your body does not properly process sugar (glucose) in the food you eat, resulting in high blood glucose levels. Typically, a GTT is done after you have had a 1-hour glucose test with results that indicate you possibly have gestational diabetes. It may also be done   if:  You have a history of giving birth to very large babies or have experienced repeated fetal loss (stillbirth).  You have signs and symptoms of diabetes, such as: ? Changes in your vision. ? Tingling or numbness in your hands or feet. ? Changes in hunger, thirst, and urination not otherwise explained by your pregnancy.  The GTT lasts about 3 hours. You will be given a sugar-water solution to drink at the  beginning of the test. You will have blood drawn before you drink the solution and then again 1, 2, and 3 hours after you drink it. You will not be allowed to eat or drink anything else during the test. You must remain at the testing location to make sure that your blood is drawn on time. You should also avoid exercising during the test, because exercise can alter test results. How do I prepare for this test? Eat normally for 3 days prior to the GTT test, including having plenty of carbohydrate-rich foods. Do not eat or drink anything except water during the final 12 hours before the test. In addition, your health care provider may ask you to stop taking certain medicines before the test. What do the results mean? It is your responsibility to obtain your test results. Ask the lab or department performing the test when and how you will get your results. Contact your health care provider to discuss any questions you have about your results. Range of Normal Values Ranges for normal values may vary among different labs and hospitals. You should always check with your health care provider after having lab work or other tests done to discuss whether your values are considered within normal limits. Normal levels of blood glucose are as follows:  Fasting: less than 105 mg/dL.  1 hour after drinking the solution: less than 190 mg/dL.  2 hours after drinking the solution: less than 165 mg/dL.  3 hours after drinking the solution: less than 145 mg/dL.  Some substances can interfere with GTT results. These may include:  Blood pressure and heart failure medicines, including beta blockers, furosemide, and thiazides.  Anti-inflammatory medicines, including aspirin.  Nicotine.  Some psychiatric medicines.  Meaning of Results Outside Normal Value Ranges GTT test results that are above normal values may indicate a number of health problems, such as:  Gestational diabetes.  Acute stress  response.  Cushing syndrome.  Tumors such as pheochromocytoma or glucagonoma.  Long-term kidney problems.  Pancreatitis.  Hyperthyroidism.  Current infection.  Discuss your test results with your health care provider. He or she will use the results to make a diagnosis and determine a treatment plan that is right for you. This information is not intended to replace advice given to you by your health care provider. Make sure you discuss any questions you have with your health care provider. Document Released: 03/24/2012 Document Revised: 02/29/2016 Document Reviewed: 01/28/2014 Elsevier Interactive Patient Education  2018 Elsevier Inc.  

## 2017-09-25 LAB — CBC
HEMATOCRIT: 35 % (ref 34.0–46.6)
Hemoglobin: 12.1 g/dL (ref 11.1–15.9)
MCH: 30.6 pg (ref 26.6–33.0)
MCHC: 34.6 g/dL (ref 31.5–35.7)
MCV: 89 fL (ref 79–97)
Platelets: 212 10*3/uL (ref 150–379)
RBC: 3.95 x10E6/uL (ref 3.77–5.28)
RDW: 13.3 % (ref 12.3–15.4)
WBC: 8.2 10*3/uL (ref 3.4–10.8)

## 2017-09-25 LAB — RPR: RPR: NONREACTIVE

## 2017-09-25 LAB — GLUCOSE TOLERANCE, 1 HOUR: Glucose, 1Hr PP: 84 mg/dL (ref 65–199)

## 2017-09-26 ENCOUNTER — Telehealth: Payer: Self-pay

## 2017-09-26 NOTE — Telephone Encounter (Signed)
Pt informed of negative/normal results.

## 2017-10-09 ENCOUNTER — Ambulatory Visit (INDEPENDENT_AMBULATORY_CARE_PROVIDER_SITE_OTHER): Payer: BLUE CROSS/BLUE SHIELD | Admitting: Certified Nurse Midwife

## 2017-10-09 VITALS — BP 99/63 | HR 61 | Wt 182.5 lb

## 2017-10-09 DIAGNOSIS — M549 Dorsalgia, unspecified: Secondary | ICD-10-CM

## 2017-10-09 DIAGNOSIS — O99891 Other specified diseases and conditions complicating pregnancy: Secondary | ICD-10-CM

## 2017-10-09 DIAGNOSIS — Z3403 Encounter for supervision of normal first pregnancy, third trimester: Secondary | ICD-10-CM

## 2017-10-09 DIAGNOSIS — N6311 Unspecified lump in the right breast, upper outer quadrant: Secondary | ICD-10-CM | POA: Insufficient documentation

## 2017-10-09 DIAGNOSIS — O9989 Other specified diseases and conditions complicating pregnancy, childbirth and the puerperium: Secondary | ICD-10-CM

## 2017-10-09 NOTE — Progress Notes (Signed)
ROB- Patient reports that she is having more vaginal discharge (clear) than usual. She also has a knot on the right side of her chest that just appeared about 1 week ago. Denies any injuries. It is mildly tender and has not changed in size.

## 2017-10-09 NOTE — Patient Instructions (Addendum)
Back Pain in Pregnancy Back pain during pregnancy is common. Back pain may be caused by several factors that are related to changes during your pregnancy. Follow these instructions at home: Managing pain, stiffness, and swelling  If directed, apply ice for sudden (acute) back pain. ? Put ice in a plastic bag. ? Place a towel between your skin and the bag. ? Leave the ice on for 20 minutes, 2-3 times per day.  If directed, apply heat to the affected area before you exercise: ? Place a towel between your skin and the heat pack or heating pad. ? Leave the heat on for 20-30 minutes. ? Remove the heat if your skin turns bright red. This is especially important if you are unable to feel pain, heat, or cold. You may have a greater risk of getting burned. Activity  Exercise as told by your health care provider. Exercising is the best way to prevent or manage back pain.  Listen to your body when lifting. If lifting hurts, ask for help or bend your knees. This uses your leg muscles instead of your back muscles.  Squat down when picking up something from the floor. Do not bend over.  Only use bed rest as told by your health care provider. Bed rest should only be used for the most severe episodes of back pain. Standing, Sitting, and Lying Down  Do not stand in one place for long periods of time.  Use good posture when sitting. Make sure your head rests over your shoulders and is not hanging forward. Use a pillow on your lower back if necessary.  Try sleeping on your side, preferably the left side, with a pillow or two between your legs. If you are sore after a night's rest, your bed may be too soft. A firm mattress may provide more support for your back during pregnancy. General instructions  Do not wear high heels.  Eat a healthy diet. Try to gain weight within your health care provider's recommendations.  Use a maternity girdle, elastic sling, or back brace as told by your health care  provider.  Take over-the-counter and prescription medicines only as told by your health care provider.  Keep all follow-up visits as told by your health care provider. This is important. This includes any visits with any specialists, such as a physical therapist. Contact a health care provider if:  Your back pain interferes with your daily activities.  You have increasing pain in other parts of your body. Get help right away if:  You develop numbness, tingling, weakness, or problems with the use of your arms or legs.  You develop severe back pain that is not controlled with medicine.  You have a sudden change in bowel or bladder control.  You develop shortness of breath, dizziness, or you faint.  You develop nausea, vomiting, or sweating.  You have back pain that is a rhythmic, cramping pain similar to labor pains. Labor pain is usually 1-2 minutes apart, lasts for about 1 minute, and involves a bearing down feeling or pressure in your pelvis.  You have back pain and your water breaks or you have vaginal bleeding.  You have back pain or numbness that travels down your leg.  Your back pain developed after you fell.  You develop pain on one side of your back.  You see blood in your urine.  You develop skin blisters in the area of your back pain. This information is not intended to replace advice given to you   by your health care provider. Make sure you discuss any questions you have with your health care provider. Document Released: 01/01/2006 Document Revised: 02/29/2016 Document Reviewed: 06/07/2015 Elsevier Interactive Patient Education  2018 [employer] Inc[employer]  Common Medications Safe in Pregnancy  Acne:      Constipation:  Benzoyl Peroxide     Colace  Clindamycin      Dulcolax Suppository  Topica Erythromycin     Fibercon  Salicylic Acid      Metamucil         Miralax AVOID:        Senakot   Accutane    Cough:  Retin-A       Cough Drops  Tetracycline      Phenergan w/  Codeine if Rx  Minocycline      Robitussin (Plain & DM)  Antibiotics:     Crabs/Lice:  Ceclor       RID  Cephalosporins    AVOID:  E-Mycins      Kwell  Keflex  Macrobid/Macrodantin   Diarrhea:  Penicillin      Kao-Pectate  Zithromax      Imodium AD         PUSH FLUIDS AVOID:       Cipro     Fever:  Tetracycline      Tylenol (Regular or Extra  Minocycline       Strength)  Levaquin      Extra Strength-Do not          Exceed 8 tabs/24 hrs Caffeine:        <200mg/day (equiv. To 1 cup of coffee or  approx. 3 12 oz sodas)         Gas: Cold/Hayfever:       Gas-X  Benadryl      Mylicon  Claritin       Phazyme  **Claritin-D        Chlor-Trimeton    Headaches:  Dimetapp      ASA-Free Excedrin  Drixoral-Non-Drowsy     Cold Compress  Mucinex (Guaifenasin)     Tylenol (Regular or Extra  Sudafed/Sudafed-12 Hour     Strength)  **Sudafed PE Pseudoephedrine   Tylenol Cold & Sinus     Vicks Vapor Rub  Zyrtec  **AVOID if Problems With Blood Pressure         Heartburn: Avoid lying down for at least 1 hour after meals  Aciphex      Maalox     Rash:  Milk of Magnesia     Benadryl    Mylanta       1% Hydrocortisone Cream  Pepcid  Pepcid Complete   Sleep Aids:  Prevacid      Ambien   Prilosec       Benadryl  Rolaids       Chamomile Tea  Tums (Limit 4/day)     Unisom  Zantac       Tylenol PM         Warm milk-add vanilla or  Hemorrhoids:       Sugar for taste  Anusol/Anusol H.C.  (RX: Analapram 2.5%)  Sugar Substitutes:  Hydrocortisone OTC     Ok in moderation  Preparation H      Tucks        Vaseline lotion applied to tissue with wiping    Herpes:     Throat:  Acyclovir      Oragel  Famvir  Valtrex     Vaccines:           Flu Shot Leg Cramps:       *Gardasil  Benadryl      Hepatitis A         Hepatitis B Nasal Spray:       Pneumovax  Saline Nasal Spray     Polio Booster         Tetanus Nausea:       Tuberculosis test or PPD  Vitamin B6 25 mg  TID   AVOID:    Dramamine      *Gardasil  Emetrol       Live Poliovirus  Ginger Root 250 mg QID    MMR (measles, mumps &  High Complex Carbs @ Bedtime    rebella)  Sea Bands-Accupressure    Varicella (Chickenpox)  Unisom 1/2 tab TID     *No known complications           If received before Pain:         Known pregnancy;   Darvocet       Resume series after  Lortab        Delivery  Percocet    Yeast:   Tramadol      Femstat  Tylenol 3      Gyne-lotrimin  Ultram       Monistat  Vicodin           MISC:         All Sunscreens           Hair Coloring/highlights          Insect Repellant's          (Including DEET)         Mystic Tans  

## 2017-10-10 NOTE — Progress Notes (Signed)
ROB-Doing well. Reports increased vaginal discharge, no itching or odor. Discussed leukorrhea in pregnancy and home treatment measures. Round, soft, mobile grape sized lump with symmetrical edges at 11 o'clock on right breast. Education regarding home treatment measures including increased water as well as decreased salt and caffeine. Will follow up next month and refer for US persists. Reviewed red flag symptoms and when to call. RTC x 2 weeks for ROB with Pattricia BossAnnie or sooner if needed.

## 2017-10-22 ENCOUNTER — Ambulatory Visit (INDEPENDENT_AMBULATORY_CARE_PROVIDER_SITE_OTHER): Payer: BLUE CROSS/BLUE SHIELD | Admitting: Certified Nurse Midwife

## 2017-10-22 ENCOUNTER — Encounter: Payer: Self-pay | Admitting: Certified Nurse Midwife

## 2017-10-22 VITALS — BP 108/58 | HR 59 | Wt 181.9 lb

## 2017-10-22 DIAGNOSIS — Z3493 Encounter for supervision of normal pregnancy, unspecified, third trimester: Secondary | ICD-10-CM

## 2017-10-22 LAB — POCT URINALYSIS DIPSTICK
Bilirubin, UA: NEGATIVE
Blood, UA: NEGATIVE
Glucose, UA: NEGATIVE
KETONES UA: NEGATIVE
Leukocytes, UA: NEGATIVE
NITRITE UA: NEGATIVE
PROTEIN UA: NEGATIVE
SPEC GRAV UA: 1.01 (ref 1.010–1.025)
UROBILINOGEN UA: 0.2 U/dL
pH, UA: 6 (ref 5.0–8.0)

## 2017-10-22 NOTE — Progress Notes (Signed)
ROB- pt is doing well 

## 2017-10-22 NOTE — Patient Instructions (Signed)

## 2017-10-22 NOTE — Progress Notes (Signed)
ROB, doing well. Feeling fetal movement denies contractions. anticipatory guidance for GBS culture an 36 wks. She verbalizes understanding. Follow up 2 wks.   Pattricia BossAnnie Rodderick Holtzer,CNM

## 2017-11-07 ENCOUNTER — Ambulatory Visit (INDEPENDENT_AMBULATORY_CARE_PROVIDER_SITE_OTHER): Payer: Managed Care, Other (non HMO) | Admitting: Certified Nurse Midwife

## 2017-11-07 VITALS — BP 118/65 | HR 60 | Wt 183.7 lb

## 2017-11-07 DIAGNOSIS — Z3493 Encounter for supervision of normal pregnancy, unspecified, third trimester: Secondary | ICD-10-CM

## 2017-11-07 NOTE — Patient Instructions (Signed)
Natural Childbirth Natural childbirth is going through labor and delivery without any drugs to relieve pain. Additionally, fetal monitors are not used, a delivery is not done, and a surgical cut to enlarge the vaginal opening (episiotomy) is not made. With the help of a birthing professional (midwife or health care provider), you direct your own labor and delivery. Many women choose natural childbirth because it makes them feel more in control and in touch with their labor and delivery. Some woman also choose natural childbirth because they are concerned about medicines affecting them and their babies. Pregnant women with a high-risk pregnancy should not attempt natural childbirth. It is better to deliver the infant in a hospital if an emergency situation arises. Sometimes, a health care provider has to get involved for the health and safety of the mother and infant. Techniques for natural childbirth  The Lamaze method-This method teaches parents that having a baby is normal, healthy, and natural. It also teaches the mother to take a neutral position regarding pain medicine and numbing medicines and to make an informed decision about using these medicines when the time comes.  The Bradley method (also called husband-coached birth)-This method teaches the father or partner to be the birth coach. It encourages the mother to exercise and eat a balanced, nutritious diet. It also involves relaxation and deep breathing exercises and preparing the parents for emergency situations. Methods of dealing with labor pain and delivery  Meditation.  Yoga.  Hypnosis.  Acupuncture.  Massage.  Changing positions (walking, rocking, showering, leaning on birth balls).  Lying in warm water or a whirlpool bath.  Finding an activity that keeps your mind off of the labor pain.  Listening to soft music.  Focusing on a particular object (visual imagery). Before going into labor  Be sure you and your spouse or  partner are in agreement about having a natural childbirth.  Decide if your health care provider or a midwife will deliver your baby.  Decide if you will have your baby in the hospital, at a birthing center, or at home.  If you have children, make plans to have someone take care of them when you go to the hospital or birthing center.  Know the distance and the time it takes to go to the hospital or birthing center. Practice going there and time it to be sure.  Have a bag packed with a nightgown, bathrobe, and toiletries. Be ready to take it with you when you go into labor.  Keep phone numbers of your family and friends handy if you need to call someone when you go into labor.  Your spouse or partner should go to all the natural childbirth technique classes.  Talk with your health care provider about the possibility of a medical emergency and what will happen if that occurs. Advantages of natural childbirth  You are in control of your labor and delivery.  You will not take medicines that could affect you and the baby.  There are no invasive procedures, such as an episiotomy.  You and your spouse or partner will work together, which can increase your bond with each other.  In most delivery centers, your family and friends can be involved in the labor and delivery process. Disadvantages of natural childbirth  You will experience pain during your labor and delivery.  The methods of helping relieve your labor pains may not work for you.  You may feel disappointed if you decide to change your mind during labor and not   have a natural childbirth. After the delivery  You will be very tired.  You will be uncomfortable because of your uterus contracting. You will feel soreness around the vagina.  You may feel cold and shaky. This is a natural reaction. This information is not intended to replace advice given to you by your health care provider. Make sure you discuss any questions you  have with your health care provider. Document Released: 09/05/2008 Document Revised: 02/29/2016 Document Reviewed: 05/31/2013 Elsevier Interactive Patient Education  2017 [employer] Inc[employer]  

## 2017-11-07 NOTE — Progress Notes (Signed)
ROB- pt is doing well 

## 2017-11-08 NOTE — Progress Notes (Signed)
ROB-Doing well, reports intermittent lower extremity swelling while working. Encouraged adequate hydration and use of compression stockings. Sample birth plan and doula information given. Reviewed red flag symptoms and when to call. RTC x 1 weeks for 36 week cultures and ROB or sooner if needed.

## 2017-11-13 ENCOUNTER — Ambulatory Visit (INDEPENDENT_AMBULATORY_CARE_PROVIDER_SITE_OTHER): Payer: Managed Care, Other (non HMO) | Admitting: Certified Nurse Midwife

## 2017-11-13 VITALS — BP 112/68 | HR 64 | Wt 188.2 lb

## 2017-11-13 DIAGNOSIS — Z3403 Encounter for supervision of normal first pregnancy, third trimester: Secondary | ICD-10-CM

## 2017-11-13 LAB — POCT URINALYSIS DIPSTICK
Bilirubin, UA: NEGATIVE
Glucose, UA: NEGATIVE
KETONES UA: NEGATIVE
LEUKOCYTES UA: NEGATIVE
Nitrite, UA: NEGATIVE
Protein, UA: NEGATIVE
RBC UA: NEGATIVE
SPEC GRAV UA: 1.01 (ref 1.010–1.025)
Urobilinogen, UA: 0.2 E.U./dL
pH, UA: 6.5 (ref 5.0–8.0)

## 2017-11-13 MED ORDER — VALACYCLOVIR HCL 500 MG PO TABS: 500.0000 mg | ORAL_TABLET | Freq: Two times a day (BID) | ORAL | 1 refills | Status: DC

## 2017-11-13 NOTE — Patient Instructions (Signed)
Natural Childbirth Natural childbirth is going through labor and delivery without any drugs to relieve pain. Additionally, fetal monitors are not used, a delivery is not done, and a surgical cut to enlarge the vaginal opening (episiotomy) is not made. With the help of a birthing professional (midwife or health care provider), you direct your own labor and delivery. Many women choose natural childbirth because it makes them feel more in control and in touch with their labor and delivery. Some woman also choose natural childbirth because they are concerned about medicines affecting them and their babies. Pregnant women with a high-risk pregnancy should not attempt natural childbirth. It is better to deliver the infant in a hospital if an emergency situation arises. Sometimes, a health care provider has to get involved for the health and safety of the mother and infant. Techniques for natural childbirth  The Lamaze method-This method teaches parents that having a baby is normal, healthy, and natural. It also teaches the mother to take a neutral position regarding pain medicine and numbing medicines and to make an informed decision about using these medicines when the time comes.  The Bradley method (also called husband-coached birth)-This method teaches the father or partner to be the birth coach. It encourages the mother to exercise and eat a balanced, nutritious diet. It also involves relaxation and deep breathing exercises and preparing the parents for emergency situations. Methods of dealing with labor pain and delivery  Meditation.  Yoga.  Hypnosis.  Acupuncture.  Massage.  Changing positions (walking, rocking, showering, leaning on birth balls).  Lying in warm water or a whirlpool bath.  Finding an activity that keeps your mind off of the labor pain.  Listening to soft music.  Focusing on a particular object (visual imagery). Before going into labor  Be sure you and your spouse or  partner are in agreement about having a natural childbirth.  Decide if your health care provider or a midwife will deliver your baby.  Decide if you will have your baby in the hospital, at a birthing center, or at home.  If you have children, make plans to have someone take care of them when you go to the hospital or birthing center.  Know the distance and the time it takes to go to the hospital or birthing center. Practice going there and time it to be sure.  Have a bag packed with a nightgown, bathrobe, and toiletries. Be ready to take it with you when you go into labor.  Keep phone numbers of your family and friends handy if you need to call someone when you go into labor.  Your spouse or partner should go to all the natural childbirth technique classes.  Talk with your health care provider about the possibility of a medical emergency and what will happen if that occurs. Advantages of natural childbirth  You are in control of your labor and delivery.  You will not take medicines that could affect you and the baby.  There are no invasive procedures, such as an episiotomy.  You and your spouse or partner will work together, which can increase your bond with each other.  In most delivery centers, your family and friends can be involved in the labor and delivery process. Disadvantages of natural childbirth  You will experience pain during your labor and delivery.  The methods of helping relieve your labor pains may not work for you.  You may feel disappointed if you decide to change your mind during labor and not   have a natural childbirth. After the delivery  You will be very tired.  You will be uncomfortable because of your uterus contracting. You will feel soreness around the vagina.  You may feel cold and shaky. This is a natural reaction. This information is not intended to replace advice given to you by your health care provider. Make sure you discuss any questions you  have with your health care provider. Document Released: 09/05/2008 Document Revised: 02/29/2016 Document Reviewed: 05/31/2013 Elsevier Interactive Patient Education  2017 [employer] Inc[employer]  

## 2017-11-13 NOTE — Progress Notes (Signed)
Stomach became tight and sharp  pain for about 30 minutes happen yesterday (11/12/17)

## 2017-11-15 LAB — GC/CHLAMYDIA PROBE AMP
Chlamydia trachomatis, NAA: NEGATIVE
Neisseria gonorrhoeae by PCR: NEGATIVE

## 2017-11-15 LAB — STREP GP B NAA: Strep Gp B NAA: NEGATIVE

## 2017-11-18 NOTE — Progress Notes (Signed)
ROB-Reports uterine irritability. Discussed home treatment measures. Scheduled for waterbirth class on 11/26/2017; consent given. Doula: Kara MeadEmma. Rx: acyclovir, see orders. 36 week cultures collected. Request SVE. Reviewed red flag symptoms and when to call.

## 2017-11-19 ENCOUNTER — Encounter: Payer: Self-pay | Admitting: Certified Nurse Midwife

## 2017-11-19 ENCOUNTER — Ambulatory Visit (INDEPENDENT_AMBULATORY_CARE_PROVIDER_SITE_OTHER): Payer: Managed Care, Other (non HMO) | Admitting: Certified Nurse Midwife

## 2017-11-19 VITALS — BP 117/68 | HR 70 | Wt 188.4 lb

## 2017-11-19 DIAGNOSIS — Z3403 Encounter for supervision of normal first pregnancy, third trimester: Secondary | ICD-10-CM | POA: Diagnosis not present

## 2017-11-19 LAB — POCT URINALYSIS DIPSTICK
BILIRUBIN UA: NEGATIVE
Glucose, UA: NEGATIVE
Ketones, UA: NEGATIVE
LEUKOCYTES UA: NEGATIVE
Nitrite, UA: NEGATIVE
PH UA: 6 (ref 5.0–8.0)
Protein, UA: NEGATIVE
RBC UA: NEGATIVE
Spec Grav, UA: 1.02 (ref 1.010–1.025)
UROBILINOGEN UA: 0.2 U/dL

## 2017-11-19 NOTE — Progress Notes (Signed)
Pt is here for an ROB visit. 

## 2017-11-19 NOTE — Patient Instructions (Signed)
Braxton Hicks Contractions °Contractions of the uterus can occur throughout pregnancy, but they are not always a sign that you are in labor. You may have practice contractions called Braxton Hicks contractions. These false labor contractions are sometimes confused with true labor. °What are Braxton Hicks contractions? °Braxton Hicks contractions are tightening movements that occur in the muscles of the uterus before labor. Unlike true labor contractions, these contractions do not result in opening (dilation) and thinning of the cervix. Toward the end of pregnancy (32-34 weeks), Braxton Hicks contractions can happen more often and may become stronger. These contractions are sometimes difficult to tell apart from true labor because they can be very uncomfortable. You should not feel embarrassed if you go to the hospital with false labor. °Sometimes, the only way to tell if you are in true labor is for your health care provider to look for changes in the cervix. The health care provider will do a physical exam and may monitor your contractions. If you are not in true labor, the exam should show that your cervix is not dilating and your water has not broken. °If there are other health problems associated with your pregnancy, it is completely safe for you to be sent home with false labor. You may continue to have Braxton Hicks contractions until you go into true labor. °How to tell the difference between true labor and false labor °True labor °· Contractions last 30-70 seconds. °· Contractions become very regular. °· Discomfort is usually felt in the top of the uterus, and it spreads to the lower abdomen and low back. °· Contractions do not go away with walking. °· Contractions usually become more intense and increase in frequency. °· The cervix dilates and gets thinner. °False labor °· Contractions are usually shorter and not as strong as true labor contractions. °· Contractions are usually irregular. °· Contractions  are often felt in the front of the lower abdomen and in the groin. °· Contractions may go away when you walk around or change positions while lying down. °· Contractions get weaker and are shorter-lasting as time goes on. °· The cervix usually does not dilate or become thin. °Follow these instructions at home: °· Take over-the-counter and prescription medicines only as told by your health care provider. °· Keep up with your usual exercises and follow other instructions from your health care provider. °· Eat and drink lightly if you think you are going into labor. °· If Braxton Hicks contractions are making you uncomfortable: °? Change your position from lying down or resting to walking, or change from walking to resting. °? Sit and rest in a tub of warm water. °? Drink enough fluid to keep your urine pale yellow. Dehydration may cause these contractions. °? Do slow and deep breathing several times an hour. °· Keep all follow-up prenatal visits as told by your health care provider. This is important. °Contact a health care provider if: °· You have a fever. °· You have continuous pain in your abdomen. °Get help right away if: °· Your contractions become stronger, more regular, and closer together. °· You have fluid leaking or gushing from your vagina. °· You pass blood-tinged mucus (bloody show). °· You have bleeding from your vagina. °· You have low back pain that you never had before. °· You feel your baby’s head pushing down and causing pelvic pressure. °· Your baby is not moving inside you as much as it used to. °Summary °· Contractions that occur before labor are called Braxton   Hicks contractions, false labor, or practice contractions. °· Braxton Hicks contractions are usually shorter, weaker, farther apart, and less regular than true labor contractions. True labor contractions usually become progressively stronger and regular and they become more frequent. °· Manage discomfort from Braxton Hicks contractions by  changing position, resting in a warm bath, drinking plenty of water, or practicing deep breathing. °This information is not intended to replace advice given to you by your health care provider. Make sure you discuss any questions you have with your health care provider. °Document Released: 02/06/2017 Document Revised: 02/06/2017 Document Reviewed: 02/06/2017 °Elsevier Interactive Patient Education © 2018 Elsevier Inc. ° °

## 2017-11-19 NOTE — Progress Notes (Signed)
ROB, doing well . No complaints. Feels good fetal movement. Has occasional contractions. SVE per pt request.Posterior - not able to get to cervix. Follow up 1 wk.   Doreene BurkeAnnie Rosena Bartle, CNM

## 2017-11-27 ENCOUNTER — Encounter: Payer: Self-pay | Admitting: Certified Nurse Midwife

## 2017-11-27 ENCOUNTER — Ambulatory Visit (INDEPENDENT_AMBULATORY_CARE_PROVIDER_SITE_OTHER): Payer: Managed Care, Other (non HMO) | Admitting: Certified Nurse Midwife

## 2017-11-27 VITALS — BP 103/56 | HR 61 | Wt 189.9 lb

## 2017-11-27 DIAGNOSIS — Z3403 Encounter for supervision of normal first pregnancy, third trimester: Secondary | ICD-10-CM

## 2017-11-27 LAB — POCT URINALYSIS DIPSTICK
Blood, UA: NEGATIVE
Glucose, UA: NEGATIVE
KETONES UA: NEGATIVE
Leukocytes, UA: NEGATIVE
Nitrite, UA: NEGATIVE
ODOR: NEGATIVE
Spec Grav, UA: 1.015 (ref 1.010–1.025)
Urobilinogen, UA: 0.2 E.U./dL
pH, UA: 6.5 (ref 5.0–8.0)

## 2017-11-27 NOTE — Patient Instructions (Signed)
Natural Childbirth Natural childbirth is going through labor and delivery without any drugs to relieve pain. Additionally, fetal monitors are not used, a delivery is not done, and a surgical cut to enlarge the vaginal opening (episiotomy) is not made. With the help of a birthing professional (midwife or health care provider), you direct your own labor and delivery. Many women choose natural childbirth because it makes them feel more in control and in touch with their labor and delivery. Some woman also choose natural childbirth because they are concerned about medicines affecting them and their babies. Pregnant women with a high-risk pregnancy should not attempt natural childbirth. It is better to deliver the infant in a hospital if an emergency situation arises. Sometimes, a health care provider has to get involved for the health and safety of the mother and infant. Techniques for natural childbirth  The Lamaze method-This method teaches parents that having a baby is normal, healthy, and natural. It also teaches the mother to take a neutral position regarding pain medicine and numbing medicines and to make an informed decision about using these medicines when the time comes.  The Bradley method (also called husband-coached birth)-This method teaches the father or partner to be the birth coach. It encourages the mother to exercise and eat a balanced, nutritious diet. It also involves relaxation and deep breathing exercises and preparing the parents for emergency situations. Methods of dealing with labor pain and delivery  Meditation.  Yoga.  Hypnosis.  Acupuncture.  Massage.  Changing positions (walking, rocking, showering, leaning on birth balls).  Lying in warm water or a whirlpool bath.  Finding an activity that keeps your mind off of the labor pain.  Listening to soft music.  Focusing on a particular object (visual imagery). Before going into labor  Be sure you and your spouse or  partner are in agreement about having a natural childbirth.  Decide if your health care provider or a midwife will deliver your baby.  Decide if you will have your baby in the hospital, at a birthing center, or at home.  If you have children, make plans to have someone take care of them when you go to the hospital or birthing center.  Know the distance and the time it takes to go to the hospital or birthing center. Practice going there and time it to be sure.  Have a bag packed with a nightgown, bathrobe, and toiletries. Be ready to take it with you when you go into labor.  Keep phone numbers of your family and friends handy if you need to call someone when you go into labor.  Your spouse or partner should go to all the natural childbirth technique classes.  Talk with your health care provider about the possibility of a medical emergency and what will happen if that occurs. Advantages of natural childbirth  You are in control of your labor and delivery.  You will not take medicines that could affect you and the baby.  There are no invasive procedures, such as an episiotomy.  You and your spouse or partner will work together, which can increase your bond with each other.  In most delivery centers, your family and friends can be involved in the labor and delivery process. Disadvantages of natural childbirth  You will experience pain during your labor and delivery.  The methods of helping relieve your labor pains may not work for you.  You may feel disappointed if you decide to change your mind during labor and not   have a natural childbirth. After the delivery  You will be very tired.  You will be uncomfortable because of your uterus contracting. You will feel soreness around the vagina.  You may feel cold and shaky. This is a natural reaction. This information is not intended to replace advice given to you by your health care provider. Make sure you discuss any questions you  have with your health care provider. Document Released: 09/05/2008 Document Revised: 02/29/2016 Document Reviewed: 05/31/2013 Elsevier Interactive Patient Education  2017 [employer] Inc[employer]  

## 2017-11-27 NOTE — Progress Notes (Addendum)
ROB- no complaints.  

## 2017-11-27 NOTE — Progress Notes (Signed)
ROB-Doing well. Didn't attend waterbirth class, still desires natural childbirth with low interventions. Discussed home labor preparation techniques. Reviewed red flag symptoms and when to call. RTC x 1 week for ROB or sooner if needed.

## 2017-12-01 ENCOUNTER — Telehealth: Payer: Self-pay | Admitting: Certified Nurse Midwife

## 2017-12-01 ENCOUNTER — Telehealth: Payer: Self-pay

## 2017-12-01 NOTE — Telephone Encounter (Signed)
mychart message sent

## 2017-12-01 NOTE — Telephone Encounter (Signed)
Patient called requesting a script for a breast pump. Thanks °

## 2017-12-02 ENCOUNTER — Ambulatory Visit (INDEPENDENT_AMBULATORY_CARE_PROVIDER_SITE_OTHER): Payer: Managed Care, Other (non HMO) | Admitting: Certified Nurse Midwife

## 2017-12-02 VITALS — BP 127/66 | HR 67 | Wt 191.6 lb

## 2017-12-02 DIAGNOSIS — Z3403 Encounter for supervision of normal first pregnancy, third trimester: Secondary | ICD-10-CM

## 2017-12-02 LAB — POCT URINALYSIS DIPSTICK
BILIRUBIN UA: NEGATIVE
Blood, UA: NEGATIVE
Glucose, UA: NEGATIVE
Ketones, UA: NEGATIVE
LEUKOCYTES UA: NEGATIVE
Nitrite, UA: NEGATIVE
PH UA: 5 (ref 5.0–8.0)
PROTEIN UA: NEGATIVE
Spec Grav, UA: 1.02 (ref 1.010–1.025)
UROBILINOGEN UA: 0.2 U/dL

## 2017-12-02 NOTE — Progress Notes (Signed)
ROB doing well. Complains of a gush of fluid this morning and again later in the morning that she had to put a pantie liner on. She feels fetal movement and denies regular or uncomfortable contractions. She states the fluid was clear in color with no odor. She admits to intercourse last night. Sterile speculum exam : no pooling , negative Nitrazine. Fern negative. Discussed return visit in one week. NST at 40 wks and scheduling induction around 41 wks. She verbalizes understanding and agrees to plan.   Tara BurkeAnnie Seniya Baldwin, CNM

## 2017-12-02 NOTE — Patient Instructions (Signed)
Braxton Hicks Contractions °Contractions of the uterus can occur throughout pregnancy, but they are not always a sign that you are in labor. You may have practice contractions called Braxton Hicks contractions. These false labor contractions are sometimes confused with true labor. °What are Braxton Hicks contractions? °Braxton Hicks contractions are tightening movements that occur in the muscles of the uterus before labor. Unlike true labor contractions, these contractions do not result in opening (dilation) and thinning of the cervix. Toward the end of pregnancy (32-34 weeks), Braxton Hicks contractions can happen more often and may become stronger. These contractions are sometimes difficult to tell apart from true labor because they can be very uncomfortable. You should not feel embarrassed if you go to the hospital with false labor. °Sometimes, the only way to tell if you are in true labor is for your health care provider to look for changes in the cervix. The health care provider will do a physical exam and may monitor your contractions. If you are not in true labor, the exam should show that your cervix is not dilating and your water has not broken. °If there are other health problems associated with your pregnancy, it is completely safe for you to be sent home with false labor. You may continue to have Braxton Hicks contractions until you go into true labor. °How to tell the difference between true labor and false labor °True labor °· Contractions last 30-70 seconds. °· Contractions become very regular. °· Discomfort is usually felt in the top of the uterus, and it spreads to the lower abdomen and low back. °· Contractions do not go away with walking. °· Contractions usually become more intense and increase in frequency. °· The cervix dilates and gets thinner. °False labor °· Contractions are usually shorter and not as strong as true labor contractions. °· Contractions are usually irregular. °· Contractions  are often felt in the front of the lower abdomen and in the groin. °· Contractions may go away when you walk around or change positions while lying down. °· Contractions get weaker and are shorter-lasting as time goes on. °· The cervix usually does not dilate or become thin. °Follow these instructions at home: °· Take over-the-counter and prescription medicines only as told by your health care provider. °· Keep up with your usual exercises and follow other instructions from your health care provider. °· Eat and drink lightly if you think you are going into labor. °· If Braxton Hicks contractions are making you uncomfortable: °? Change your position from lying down or resting to walking, or change from walking to resting. °? Sit and rest in a tub of warm water. °? Drink enough fluid to keep your urine pale yellow. Dehydration may cause these contractions. °? Do slow and deep breathing several times an hour. °· Keep all follow-up prenatal visits as told by your health care provider. This is important. °Contact a health care provider if: °· You have a fever. °· You have continuous pain in your abdomen. °Get help right away if: °· Your contractions become stronger, more regular, and closer together. °· You have fluid leaking or gushing from your vagina. °· You pass blood-tinged mucus (bloody show). °· You have bleeding from your vagina. °· You have low back pain that you never had before. °· You feel your baby’s head pushing down and causing pelvic pressure. °· Your baby is not moving inside you as much as it used to. °Summary °· Contractions that occur before labor are called Braxton   Hicks contractions, false labor, or practice contractions. °· Braxton Hicks contractions are usually shorter, weaker, farther apart, and less regular than true labor contractions. True labor contractions usually become progressively stronger and regular and they become more frequent. °· Manage discomfort from Braxton Hicks contractions by  changing position, resting in a warm bath, drinking plenty of water, or practicing deep breathing. °This information is not intended to replace advice given to you by your health care provider. Make sure you discuss any questions you have with your health care provider. °Document Released: 02/06/2017 Document Revised: 02/06/2017 Document Reviewed: 02/06/2017 °Elsevier Interactive Patient Education © 2018 Elsevier Inc. ° °

## 2017-12-02 NOTE — Progress Notes (Signed)
Pt has had several instances of gush of clear fluid. Had some back pain.

## 2017-12-04 ENCOUNTER — Encounter: Payer: Managed Care, Other (non HMO) | Admitting: Certified Nurse Midwife

## 2017-12-10 ENCOUNTER — Encounter: Payer: Managed Care, Other (non HMO) | Admitting: Certified Nurse Midwife

## 2017-12-11 ENCOUNTER — Other Ambulatory Visit: Payer: Self-pay | Admitting: Certified Nurse Midwife

## 2017-12-11 ENCOUNTER — Ambulatory Visit (INDEPENDENT_AMBULATORY_CARE_PROVIDER_SITE_OTHER): Payer: Managed Care, Other (non HMO) | Admitting: Certified Nurse Midwife

## 2017-12-11 ENCOUNTER — Other Ambulatory Visit (INDEPENDENT_AMBULATORY_CARE_PROVIDER_SITE_OTHER): Payer: Managed Care, Other (non HMO)

## 2017-12-11 VITALS — BP 115/68 | HR 69 | Wt 196.8 lb

## 2017-12-11 DIAGNOSIS — O480 Post-term pregnancy: Secondary | ICD-10-CM | POA: Diagnosis not present

## 2017-12-11 DIAGNOSIS — Z3493 Encounter for supervision of normal pregnancy, unspecified, third trimester: Secondary | ICD-10-CM

## 2017-12-11 LAB — POCT URINALYSIS DIPSTICK
Bilirubin, UA: NEGATIVE
GLUCOSE UA: NEGATIVE
KETONES UA: NEGATIVE
LEUKOCYTES UA: NEGATIVE
NITRITE UA: NEGATIVE
PROTEIN UA: NEGATIVE
Spec Grav, UA: 1.015 (ref 1.010–1.025)
Urobilinogen, UA: 0.2 E.U./dL
pH, UA: 7 (ref 5.0–8.0)

## 2017-12-11 NOTE — Progress Notes (Signed)
ROB-NST performed today was reviewed and was found to be reactive. Baseline 130with Moderate variability; No decels noted. BPP: 8/10, findings reviewed with Dr. Greggory KeeneFrancesco. Continue twice daily kick counts. Reviewed red flag symptoms and when to call. RTC x Monday for NST and ROB. IOL scheduled 12/18/2017 @ 0500.  ULTRASOUND REPORT  Location: [facility] Date of Service:  12/11/2017  Indications: BPP & Growth for Post-Dates Findings:   **Limited study due to fetal position and gestational age.**  Singleton intrauterine pregnancy is visualized with FHR at 135 BPM. Biometrics give an (U/S) Gestational age of 26 4/7 weeks and an (U/S) EDD of 12/28/17; this correlates with the clinically established EDD of 12/10/17.  Fetal presentation is vertex.  Spine posterior.  EFW: 3249 grams (7lb 3oz).  34th percentile.  BPD measures 39th, HC measures 6th, AC measures 14th, and FL measures 3rd percentile. Placenta: Fundal and grade 3. AFI: 10.6 cm.  BPP: 6/8 with good visualization of fetal movement, tone, and fluid.  Points were unable to be awarded for fetal breathing.  Fetal stomach, kidneys, and bladder appear WNL.  Impression: 1. 37 4/7 week Viable Singleton Intrauterine pregnancy by U/S. 2. (U/S) EDD is consistent with Clinically established (LMP) EDD of 12/10/17. 3. EFW: 3249 grams (7lb 3oz).  34th percentile.  BPD measures 39th, HC measures 6th, AC measures 14th, and FL measures 3rd percentile. 4. BPP: 6/8 - Fetal breathing was not visualized during course of exam. 5. Limited study due to fetal position and gestational age. 6. Notified Michelle.  Recommendations: 1.Clinical correlation with the patient's History and Physical Exam.

## 2017-12-11 NOTE — Progress Notes (Signed)
ROB- pt is having some pelvic pressure, otherwise she is doing well 

## 2017-12-11 NOTE — Patient Instructions (Addendum)
Fetal Movement Counts Patient Name: ________________________________________________ Patient Due Date: ____________________ What is a fetal movement count? A fetal movement count is the number of times that you feel your baby move during a certain amount of time. This may also be called a fetal kick count. A fetal movement count is recommended for every pregnant woman. You may be asked to start counting fetal movements as early as week 28 of your pregnancy. Pay attention to when your baby is most active. You may notice your baby's sleep and wake cycles. You may also notice things that make your baby move more. You should do a fetal movement count:  When your baby is normally most active.  At the same time each day.  A good time to count movements is while you are resting, after having something to eat and drink. How do I count fetal movements? 1. Find a quiet, comfortable area. Sit, or lie down on your side. 2. Write down the date, the start time and stop time, and the number of movements that you felt between those two times. Take this information with you to your health care visits. 3. For 2 hours, count kicks, flutters, swishes, rolls, and jabs. You should feel at least 10 movements during 2 hours. 4. You may stop counting after you have felt 10 movements. 5. If you do not feel 10 movements in 2 hours, have something to eat and drink. Then, keep resting and counting for 1 hour. If you feel at least 4 movements during that hour, you may stop counting. Contact a health care provider if:  You feel fewer than 4 movements in 2 hours.  Your baby is not moving like he or she usually does. Date: ____________ Start time: ____________ Stop time: ____________ Movements: ____________ Date: ____________ Start time: ____________ Stop time: ____________ Movements: ____________ Date: ____________ Start time: ____________ Stop time: ____________ Movements: ____________ Date: ____________ Start time:  ____________ Stop time: ____________ Movements: ____________ Date: ____________ Start time: ____________ Stop time: ____________ Movements: ____________ Date: ____________ Start time: ____________ Stop time: ____________ Movements: ____________ Date: ____________ Start time: ____________ Stop time: ____________ Movements: ____________ Date: ____________ Start time: ____________ Stop time: ____________ Movements: ____________ Date: ____________ Start time: ____________ Stop time: ____________ Movements: ____________ This information is not intended to replace advice given to you by your health care provider. Make sure you discuss any questions you have with your health care provider. Document Released: 10/23/2006 Document Revised: 05/22/2016 Document Reviewed: 11/02/2015 Elsevier Interactive Patient Education  2018 Elsevier Inc. Nonstress Test The nonstress test is a procedure that monitors the fetus's heartbeat. The test will monitor the heartbeat when the fetus is at rest and while the fetus is moving. In a healthy fetus, there will be an increase in fetal heart rate when the fetus moves or kicks. The heart rate will decrease at rest. This test helps determine if the fetus is healthy. Your health care provider will look at a number of patterns in the heart rate tracing to make sure your baby is thriving. If there is concern, your health care provider may order additional tests or may suggest another course of action. This test is often done in the third trimester and can help determine if an early delivery is needed and safe. Common reasons to have this test are:  You are past your due date.  You have a high-risk pregnancy.  You are feeling less movement than normal.  You have lost a pregnancy in the past.  Your health   care provider suspects fetal growth problems.  You have too much or too little amniotic fluid.  What happens before the procedure?  Eat a meal right before the test or as  directed by your health care provider. Food may help stimulate fetal movements.  Use the restroom right before the test. What happens during the procedure?  Two belts will be placed around your abdomen. These belts have monitors attached to them. One records the fetal heart rate and the other records uterine contractions.  You may be asked to lie down on your side or to stay sitting upright.  You may be given a button to press when you feel movement.  The fetal heartbeat is listened to and watched on a screen. The heartbeat is recorded on a sheet of paper.  If the fetus seems to be sleeping, you may be asked to drink some juice or soda, gently press your abdomen, or make some noise to wake the fetus. What happens after the procedure? Your health care provider will discuss the test results with you and make recommendations for the near future.  This information is not intended to replace advice given to you by your health care provider. Make sure you discuss any questions you have with your health care provider. This information is not intended to replace advice given to you by your health care provider. Make sure you discuss any questions you have with your health care provider. Document Released: 09/13/2002 Document Revised: 08/23/2016 Document Reviewed: 10/27/2012 Elsevier Interactive Patient Education  2018 [employer]  Labor Induction Labor induction is when steps are taken to cause a pregnant woman to begin the labor process. Most women go into labor on their own between 37 weeks and 42 weeks of the pregnancy. When this does not happen or when there is a medical need, methods may be used to induce labor. Labor induction causes a pregnant woman's uterus to contract. It also causes the cervix to soften (ripen), open (dilate), and thin out (efface). Usually, labor is not induced before 39 weeks of the pregnancy unless there is a problem with the baby or mother. Before inducing labor, your  health care provider will consider a number of factors, including the following:  The medical condition of you and the baby.  How many weeks along you are.  The status of the baby's lung maturity.  The condition of the cervix.  The position of the baby.  What are the reasons for labor induction? Labor may be induced for the following reasons:  The health of the baby or mother is at risk.  The pregnancy is overdue by 1 week or more.  The water breaks but labor does not start on its own.  The mother has a health condition or serious illness, such as high blood pressure, infection, placental abruption, or diabetes.  The amniotic fluid amounts are low around the baby.  The baby is distressed.  Convenience or wanting the baby to be born on a certain date is not a reason for inducing labor. What methods are used for labor induction? Several methods of labor induction may be used, such as:  Prostaglandin medicine. This medicine causes the cervix to dilate and ripen. The medicine will also start contractions. It can be taken by mouth or by inserting a suppository into the vagina.  Inserting a thin tube (catheter) with a balloon on the end into the vagina to dilate the cervix. Once inserted, the balloon is expanded with water, which causes  the cervix to open.  Stripping the membranes. Your health care provider separates amniotic sac tissue from the cervix, causing the cervix to be stretched and causing the release of a hormone called progesterone. This may cause the uterus to contract. It is often done during an office visit. You will be sent home to wait for the contractions to begin. You will then come in for an induction.  Breaking the water. Your health care provider makes a hole in the amniotic sac using a small instrument. Once the amniotic sac breaks, contractions should begin. This may still take hours to see an effect.  Medicine to trigger or strengthen contractions. This  medicine is given through an IV access tube inserted into a vein in your arm.  All of the methods of induction, besides stripping the membranes, will be done in the hospital. Induction is done in the hospital so that you and the baby can be carefully monitored. How long does it take for labor to be induced? Some inductions can take up to 2-3 days. Depending on the cervix, it usually takes less time. It takes longer when you are induced early in the pregnancy or if this is your first pregnancy. If a mother is still pregnant and the induction has been going on for 2-3 days, either the mother will be sent home or a cesarean delivery will be needed. What are the risks associated with labor induction? Some of the risks of induction include:  Changes in fetal heart rate, such as too high, too low, or erratic.  Fetal distress.  Chance of infection for the mother and baby.  Increased chance of having a cesarean delivery.  Breaking off (abruption) of the placenta from the uterus (rare).  Uterine rupture (very rare).  When induction is needed for medical reasons, the benefits of induction may outweigh the risks. What are some reasons for not inducing labor? Labor induction should not be done if:  It is shown that your baby does not tolerate labor.  You have had previous surgeries on your uterus, such as a myomectomy or the removal of fibroids.  Your placenta lies very low in the uterus and blocks the opening of the cervix (placenta previa).  Your baby is not in a head-down position.  The umbilical cord drops down into the birth canal in front of the baby. This could cut off the baby's blood and oxygen supply.  You have had a previous cesarean delivery.  There are unusual circumstances, such as the baby being extremely premature.  This information is not intended to replace advice given to you by your health care provider. Make sure you discuss any questions you have with your health care  provider. Document Released: 02/12/2007 Document Revised: 02/29/2016 Document Reviewed: 04/22/2013 Elsevier Interactive Patient Education  2017 [employer].

## 2017-12-14 ENCOUNTER — Encounter: Payer: Self-pay | Admitting: *Deleted

## 2017-12-14 ENCOUNTER — Other Ambulatory Visit: Payer: Self-pay

## 2017-12-14 ENCOUNTER — Observation Stay
Admission: EM | Admit: 2017-12-14 | Discharge: 2017-12-14 | Disposition: A | Discharge status: Home / Self Care | Payer: Managed Care, Other (non HMO) | Attending: Certified Nurse Midwife | Admitting: Certified Nurse Midwife

## 2017-12-14 DIAGNOSIS — O99353 Diseases of the nervous system complicating pregnancy, third trimester: Principal | ICD-10-CM | POA: Insufficient documentation

## 2017-12-14 DIAGNOSIS — G43909 Migraine, unspecified, not intractable, without status migrainosus: Secondary | ICD-10-CM | POA: Diagnosis not present

## 2017-12-14 DIAGNOSIS — Z79899 Other long term (current) drug therapy: Secondary | ICD-10-CM | POA: Diagnosis not present

## 2017-12-14 DIAGNOSIS — Z3490 Encounter for supervision of normal pregnancy, unspecified, unspecified trimester: Secondary | ICD-10-CM

## 2017-12-14 DIAGNOSIS — R109 Unspecified abdominal pain: Secondary | ICD-10-CM | POA: Diagnosis present

## 2017-12-14 DIAGNOSIS — Z3A40 40 weeks gestation of pregnancy: Secondary | ICD-10-CM | POA: Insufficient documentation

## 2017-12-14 HISTORY — DX: Headache: R51

## 2017-12-14 HISTORY — DX: Headache, unspecified: R51.9

## 2017-12-14 LAB — COMPREHENSIVE METABOLIC PANEL
ALBUMIN: 3.4 g/dL — AB (ref 3.5–5.0)
ALT: 15 U/L (ref 14–54)
ANION GAP: 8 (ref 5–15)
AST: 25 U/L (ref 15–41)
Alkaline Phosphatase: 140 U/L — ABNORMAL HIGH (ref 38–126)
BUN: 7 mg/dL (ref 6–20)
CO2: 21 mmol/L — AB (ref 22–32)
Calcium: 8.9 mg/dL (ref 8.9–10.3)
Chloride: 108 mmol/L (ref 101–111)
Creatinine, Ser: 0.78 mg/dL (ref 0.44–1.00)
GFR calc Af Amer: >60 mL/min (ref >60)
GFR calc non Af Amer: >60 mL/min (ref >60)
GLUCOSE: 111 mg/dL — AB (ref 65–99)
Potassium: 4 mmol/L (ref 3.5–5.1)
SODIUM: 137 mmol/L (ref 135–145)
Total Bilirubin: 0.6 mg/dL (ref 0.3–1.2)
Total Protein: 7.2 g/dL (ref 6.5–8.1)

## 2017-12-14 LAB — CBC
HCT: 37.9 % (ref 35.0–47.0)
Hemoglobin: 13.4 g/dL (ref 12.0–16.0)
MCH: 32.3 pg (ref 26.0–34.0)
MCHC: 35.4 g/dL (ref 32.0–36.0)
MCV: 91.1 fL (ref 80.0–100.0)
PLATELETS: 163 10*3/uL (ref 150–440)
RBC: 4.16 MIL/uL (ref 3.80–5.20)
RDW: 13.6 % (ref 11.5–14.5)
WBC: 8.3 10*3/uL (ref 3.6–11.0)

## 2017-12-14 LAB — PROTEIN / CREATININE RATIO, URINE
Creatinine, Urine: 30 mg/dL
Total Protein, Urine: <6 mg/dL

## 2017-12-14 MED ORDER — BUTALBITAL-APAP-CAFFEINE 50-325-40 MG PO TABS: 1.0000 | ORAL_TABLET | Freq: Once | ORAL | Status: DC

## 2017-12-14 NOTE — OB Triage Note (Signed)
L&D OB Triage Note  SUBJECTIVE Tara SouBreanna Baldwin is a 27 y.o. G1P0 female at 2666w4d, EDD Estimated Date of Delivery: 12/10/17 who presented to triage with complaints of swelling her her feet and ankles as well as a headache. She has take tylenol . She feels good fetal movement , denies contractions, and leaking of fluid.   Obstetric History   G1   P0   T0   P0   A0   L0    SAB0   TAB0   Ectopic0   Multiple0   Live Births0     # Outcome Date GA Lbr Len/2nd Weight Sex Delivery Anes PTL Lv  1 Current               Medications Prior to Admission  Medication Sig Dispense Refill Last Dose  . acetaminophen (TYLENOL) 500 MG tablet Take 500 mg by mouth every 6 (six) hours as needed for headache.   12/14/2017 at Unknown time  . Cholecalciferol (VITAMIN D3) 10000 units TABS Take by mouth.   Past Week at Unknown time  . Prenatal Multivit-Min-Fe-FA (PRENATAL VITAMINS PO) Take by mouth.   Past Week at Unknown time  . valACYclovir (VALTREX) 500 MG tablet Take 1 tablet (500 mg total) by mouth 2 (two) times daily. 60 tablet 1 Past Week at Unknown time     OBJECTIVE  Nursing Evaluation:   BP 105/68   Pulse (!) 59   Temp 99 F (37.2 C) (Oral)   Resp 14   Ht 5\' 6"  (1.676 m)   Wt 196 lb 12.8 oz (89.3 kg)   LMP 02/06/2017   BMI 31.76 kg/m    Findings:    NST was performed and has been reviewed by me.  NST INTERPRETATION: Category I  Mode: External Baseline Rate (A): 135 bpm Variability: Moderate Accelerations: 10 x 10, 15 x 15 Decelerations: Variable     Contraction Frequency (min): irritability w/occasional ctx   CMP Latest Ref Rng & Units 12/14/2017  Glucose 65 - 99 mg/dL 782(N111(H)  BUN 6 - 20 mg/dL 7  Creatinine 5.620.44 - 1.301.00 mg/dL 8.650.78  Sodium 784135 - 696145 mmol/L 137  Potassium 3.5 - 5.1 mmol/L 4.0  Chloride 101 - 111 mmol/L 108  CO2 22 - 32 mmol/L 21(L)  Calcium 8.9 - 10.3 mg/dL 8.9  Total Protein 6.5 - 8.1 g/dL 7.2  Total Bilirubin 0.3 - 1.2 mg/dL 0.6  Alkaline Phos 38 - 126 U/L  140(H)  AST 15 - 41 U/L 25  ALT 14 - 54 U/L 15   CBC    Component Value Date/Time   WBC 8.3 12/14/2017 1548   RBC 4.16 12/14/2017 1548   HGB 13.4 12/14/2017 1548   HGB 12.1 09/24/2017 1405   HCT 37.9 12/14/2017 1548   HCT 35.0 09/24/2017 1405   PLT 163 12/14/2017 1548   PLT 212 09/24/2017 1405   MCV 91.1 12/14/2017 1548   MCV 89 09/24/2017 1405   MCH 32.3 12/14/2017 1548   MCHC 35.4 12/14/2017 1548   RDW 13.6 12/14/2017 1548   RDW 13.3 09/24/2017 1405   Protein / creatinine ratio, urine  Order: [id number]  Status:  Final result Visible to patient:  No (Not Released) Next appt:  12/15/2017 at 01:30 PM in Obstetrics and Gynecology ([facility] NST ROOM)   Ref Range & Units 15:34  Creatinine, Urine mg/dL 30   Total Protein, Urine mg/dL <6   Comment: NO NORMAL RANGE ESTABLISHED FOR THIS TEST  Protein Creatinine Ratio 0.00 -  0.15 mg/mg         ASSESSMENT Impression:  1.  Pregnancy:  G1P0 at [redacted]w[redacted]d , EDD Estimated Date of Delivery: 12/10/17 2.  NST:  Category I  3. Migraine headache  PLAN 1. Reassurance given, pt declined Fioricet. Continue tylenol as needed. Not to exceed 4g in 24 hrs.   2. Discharge home with standard labor precautions given to return to L&D or call the office for problems. 3. Continue routine prenatal care.    Doreene Burke, CNM

## 2017-12-14 NOTE — Discharge Instructions (Signed)
Please keep your next scheduled appointment.  If you have any questions or concerns please call your on-call provider.  You may also call the nurse's desk at [facility] for questions [phone].  If you have urgent concerns please go to the closest emergency department for assessment.  You may take extra strength tylenol up to 1000mg  every 6 hours for headache pain.  Please do not exceed the daily maximum dosage of 4000mg  of tylenol.  If you decide to try something different for pain please call the office for an appointment to be seen.

## 2017-12-15 ENCOUNTER — Other Ambulatory Visit: Payer: Managed Care, Other (non HMO)

## 2017-12-15 ENCOUNTER — Observation Stay
Admission: EM | Admit: 2017-12-15 | Discharge: 2017-12-15 | Disposition: A | Discharge status: Home / Self Care | Payer: Managed Care, Other (non HMO) | Source: Home / Self Care | Admitting: Obstetrics and Gynecology

## 2017-12-15 ENCOUNTER — Ambulatory Visit (INDEPENDENT_AMBULATORY_CARE_PROVIDER_SITE_OTHER): Payer: Managed Care, Other (non HMO) | Admitting: Certified Nurse Midwife

## 2017-12-15 ENCOUNTER — Other Ambulatory Visit: Payer: Self-pay

## 2017-12-15 VITALS — BP 123/67 | HR 63 | Wt 201.5 lb

## 2017-12-15 DIAGNOSIS — O288 Other abnormal findings on antenatal screening of mother: Secondary | ICD-10-CM | POA: Diagnosis not present

## 2017-12-15 DIAGNOSIS — Z3403 Encounter for supervision of normal first pregnancy, third trimester: Secondary | ICD-10-CM | POA: Insufficient documentation

## 2017-12-15 DIAGNOSIS — Z3A40 40 weeks gestation of pregnancy: Secondary | ICD-10-CM

## 2017-12-15 DIAGNOSIS — O480 Post-term pregnancy: Secondary | ICD-10-CM

## 2017-12-15 LAB — POCT URINALYSIS DIPSTICK
Bilirubin, UA: NEGATIVE
Blood, UA: NEGATIVE
Glucose, UA: NEGATIVE
Ketones, UA: NEGATIVE
LEUKOCYTES UA: NEGATIVE
NITRITE UA: NEGATIVE
PH UA: 7 (ref 5.0–8.0)
Protein, UA: NEGATIVE
UROBILINOGEN UA: 0.2 U/dL

## 2017-12-15 NOTE — Patient Instructions (Signed)
Nonstress Test The nonstress test is a procedure that monitors the fetus's heartbeat. The test will monitor the heartbeat when the fetus is at rest and while the fetus is moving. In a healthy fetus, there will be an increase in fetal heart rate when the fetus moves or kicks. The heart rate will decrease at rest. This test helps determine if the fetus is healthy. Your health care provider will look at a number of patterns in the heart rate tracing to make sure your baby is thriving. If there is concern, your health care provider may order additional tests or may suggest another course of action. This test is often done in the third trimester and can help determine if an early delivery is needed and safe. Common reasons to have this test are:  You are past your due date.  You have a high-risk pregnancy.  You are feeling less movement than normal.  You have lost a pregnancy in the past.  Your health care provider suspects fetal growth problems.  You have too much or too little amniotic fluid.  What happens before the procedure?  Eat a meal right before the test or as directed by your health care provider. Food may help stimulate fetal movements.  Use the restroom right before the test. What happens during the procedure?  Two belts will be placed around your abdomen. These belts have monitors attached to them. One records the fetal heart rate and the other records uterine contractions.  You may be asked to lie down on your side or to stay sitting upright.  You may be given a button to press when you feel movement.  The fetal heartbeat is listened to and watched on a screen. The heartbeat is recorded on a sheet of paper.  If the fetus seems to be sleeping, you may be asked to drink some juice or soda, gently press your abdomen, or make some noise to wake the fetus. What happens after the procedure? Your health care provider will discuss the test results with you and make recommendations  for the near future.  This information is not intended to replace advice given to you by your health care provider. Make sure you discuss any questions you have with your health care provider. This information is not intended to replace advice given to you by your health care provider. Make sure you discuss any questions you have with your health care provider. Document Released: 09/13/2002 Document Revised: 08/23/2016 Document Reviewed: 10/27/2012 Elsevier Interactive Patient Education  2018 Elsevier Inc.  

## 2017-12-15 NOTE — Progress Notes (Signed)
Pt presents today for NST ,   NST : non reactive Baseline 145 Variability- moderate Accelerations: absent Declarations: absent  Contractions: irregular-mild  Pt sent to L&D for prolonged monitoring

## 2017-12-15 NOTE — OB Triage Note (Signed)
  L&D OB Triage Note  SUBJECTIVE Tara Baldwin is a 27 y.o. G1P0 female at 9457w5d, EDD Estimated Date of Delivery: 12/10/17 who was sent to L&D for monitoring due to non reactive NST in the office.    Obstetric History   G1   P0   T0   P0   A0   L0    SAB0   TAB0   Ectopic0   Multiple0   Live Births0     # Outcome Date GA Lbr Len/2nd Weight Sex Delivery Anes PTL Lv  1 Current               Medications Prior to Admission  Medication Sig Dispense Refill Last Dose  . acetaminophen (TYLENOL) 500 MG tablet Take 500 mg by mouth every 6 (six) hours as needed for headache.   12/14/2017 at Unknown time  . Cholecalciferol (VITAMIN D3) 10000 units TABS Take by mouth.   12/14/2017 at Unknown time  . Prenatal Multivit-Min-Fe-FA (PRENATAL VITAMINS PO) Take by mouth.   12/14/2017 at Unknown time  . valACYclovir (VALTREX) 500 MG tablet Take 1 tablet (500 mg total) by mouth 2 (two) times daily. 60 tablet 1 12/14/2017 at Unknown time     OBJECTIVE  Nursing Evaluation:   BP 129/71 (BP Location: Left Arm)   Pulse 62   Temp 98.6 F (37 C) (Oral)   Resp 16   Ht 5\' 6"  (1.676 m)   Wt 201 lb (91.2 kg)   LMP 02/06/2017   SpO2 100%   BMI 32.44 kg/m    Findings:    NST was performed and has been reviewed by me.  NST INTERPRETATION: Category I  Mode: External Baseline Rate (A): 125 bpm Variability: Moderate Accelerations: 15 x 15 Decelerations: None     Contraction Frequency (min): 5-6     ASSESSMENT Impression:  1.  Pregnancy:  G1P0 at 2957w5d , EDD Estimated Date of Delivery: 12/10/17 2.  NST:  Category I  PLAN 1. Reassurance given 2. Discharge home with standard labor precautions given to return to L&D or call the office for problems. Return to L&D for decreased fetal movement.  3. Follow up as scheduled for induction on Thursday.     Tara Baldwin, CNM

## 2017-12-15 NOTE — OB Triage Note (Signed)
Pt was sent from the office for prolonged monitoring for a non-reactive strip today. Denies any NVD, bleeding or LOF. Reports positive fetal movement. Vitals WNL, will continue to monitor.

## 2017-12-15 NOTE — Addendum Note (Signed)
Addended by: Silvano BilisHAMPTON, Britteny Fiebelkorn L on: 12/15/2017 03:22 PM   Modules accepted: Orders

## 2017-12-15 NOTE — Discharge Summary (Signed)
Reviewed discharge instructions with patient. Went over fetal kick counts and when to notify physician or come in to be seen. Patient was a  little discouraged that it wasn't time to have a baby. Went over signs of labor with patient, and reminded her that she was scheduled to be induced on Thursday. Gave patient opportunity for questions. All questions answered. Pt verbalized understanding, and discharged home.

## 2017-12-18 ENCOUNTER — Inpatient Hospital Stay
Admission: RE | Admit: 2017-12-18 | Discharge: 2017-12-22 | DRG: 787 | Disposition: A | Discharge status: Home / Self Care | Payer: Managed Care, Other (non HMO) | Source: Intra-hospital | Attending: Certified Nurse Midwife | Admitting: Certified Nurse Midwife

## 2017-12-18 ENCOUNTER — Other Ambulatory Visit: Payer: Self-pay

## 2017-12-18 DIAGNOSIS — O9832 Other infections with a predominantly sexual mode of transmission complicating childbirth: Secondary | ICD-10-CM | POA: Diagnosis present

## 2017-12-18 DIAGNOSIS — Z3A41 41 weeks gestation of pregnancy: Secondary | ICD-10-CM | POA: Diagnosis not present

## 2017-12-18 DIAGNOSIS — O480 Post-term pregnancy: Secondary | ICD-10-CM | POA: Diagnosis present

## 2017-12-18 DIAGNOSIS — O9081 Anemia of the puerperium: Secondary | ICD-10-CM | POA: Diagnosis not present

## 2017-12-18 DIAGNOSIS — D62 Acute posthemorrhagic anemia: Secondary | ICD-10-CM | POA: Diagnosis not present

## 2017-12-18 DIAGNOSIS — A6000 Herpesviral infection of urogenital system, unspecified: Secondary | ICD-10-CM | POA: Diagnosis present

## 2017-12-18 LAB — TYPE AND SCREEN
ABO/RH(D): B POS
Antibody Screen: NEGATIVE

## 2017-12-18 LAB — CBC
HEMATOCRIT: 37.4 % (ref 35.0–47.0)
HEMOGLOBIN: 12.7 g/dL (ref 12.0–16.0)
MCH: 31.2 pg (ref 26.0–34.0)
MCHC: 34 g/dL (ref 32.0–36.0)
MCV: 91.6 fL (ref 80.0–100.0)
Platelets: 156 10*3/uL (ref 150–440)
RBC: 4.08 MIL/uL (ref 3.80–5.20)
RDW: 13.7 % (ref 11.5–14.5)
WBC: 8.7 10*3/uL (ref 3.6–11.0)

## 2017-12-18 MED ORDER — ZOLPIDEM TARTRATE 5 MG PO TABS: 5.0000 mg | ORAL_TABLET | Freq: Every evening | ORAL | Status: DC | PRN

## 2017-12-18 MED ORDER — LIDOCAINE HCL (PF) 1 % IJ SOLN
30.0000 mL | INTRAMUSCULAR | Status: AC | PRN
  Administered 2017-12-19: 3 mL via SUBCUTANEOUS

## 2017-12-18 MED ORDER — LACTATED RINGERS IV SOLN
500.0000 mL | INTRAVENOUS | Status: DC | PRN
  Administered 2017-12-18: 250 mL via INTRAVENOUS
  Administered 2017-12-19: 500 mL via INTRAVENOUS

## 2017-12-18 MED ORDER — OXYTOCIN 10 UNIT/ML IJ SOLN: 10.0000 [IU] | Freq: Once | INTRAMUSCULAR | Status: DC

## 2017-12-18 MED ORDER — SOD CITRATE-CITRIC ACID 500-334 MG/5ML PO SOLN
30.0000 mL | ORAL | Status: DC | PRN
  Filled 2017-12-18: qty 15

## 2017-12-18 MED ORDER — TERBUTALINE SULFATE 1 MG/ML IJ SOLN: 0.2500 mg | Freq: Once | INTRAMUSCULAR | Status: DC | PRN

## 2017-12-18 MED ORDER — OXYTOCIN 40 UNITS IN LACTATED RINGERS INFUSION - SIMPLE MED
2.5000 [IU]/h | INTRAVENOUS | Status: DC
  Filled 2017-12-18 (×2): qty 1000

## 2017-12-18 MED ORDER — MISOPROSTOL 50MCG HALF TABLET
50.0000 ug | ORAL_TABLET | ORAL | Status: DC | PRN
  Administered 2017-12-18: 50 ug via VAGINAL
  Filled 2017-12-18: qty 1

## 2017-12-18 MED ORDER — LACTATED RINGERS IV SOLN
INTRAVENOUS | Status: DC
  Administered 2017-12-18 – 2017-12-19 (×3): via INTRAVENOUS

## 2017-12-18 MED ORDER — OXYTOCIN BOLUS FROM INFUSION: 500.0000 mL | Freq: Once | INTRAVENOUS | Status: DC

## 2017-12-18 MED ORDER — ONDANSETRON HCL 4 MG/2ML IJ SOLN: 4.0000 mg | Freq: Four times a day (QID) | INTRAMUSCULAR | Status: DC | PRN

## 2017-12-18 MED ORDER — ACETAMINOPHEN 325 MG PO TABS: 650.0000 mg | ORAL_TABLET | ORAL | Status: DC | PRN

## 2017-12-18 MED ORDER — BUTORPHANOL TARTRATE 1 MG/ML IJ SOLN: 1.0000 mg | INTRAMUSCULAR | Status: DC | PRN

## 2017-12-18 MED ORDER — OXYTOCIN 40 UNITS IN LACTATED RINGERS INFUSION - SIMPLE MED
1.0000 m[IU]/min | INTRAVENOUS | Status: DC
  Administered 2017-12-19: 2 m[IU]/min via INTRAVENOUS
  Filled 2017-12-18: qty 1000

## 2017-12-18 NOTE — Progress Notes (Signed)
Patient ID: Tara Baldwin, female   DOB: 1991/06/18, 27 y.o.   MRN: [id number]  Tara Baldwin is a 27 y.o. G1P0 at 1419w1d by ultrasound admitted for induction of labor due to Post dates. Due date 12/10/2017.  Subjective:  Standing at bedside, breathing through contractions. Family members and doula at bedside.   Denies difficulty breathing or respiratory distress, chest pain, vaginal bleeding, dysuria, and leg pain or swelling.   Objective:  Temp:  [98.4 F (36.9 C)-98.5 F (36.9 C)] 98.5 F (36.9 C) (03/14 1525) Pulse Rate:  [56-66] 58 (03/14 1525) Resp:  [16-18] 16 (03/14 1525) BP: (107-124)/(57-70) 120/66 (03/14 1525) Weight:  [201 lb (91.2 kg)] 201 lb (91.2 kg) (03/14 0527)  Fetal Wellbeing:  Category I  UC:   regular, every one (1) to four (4) minutes, soft resting tone  SVE:   Dilation: 1.5 Effacement (%): 50 Station: -2 Exam by:: Willodean RosenthalM. Luiscarlos Kaczmarczyk, CNM  Labs: Lab Results  Component Value Date   WBC 8.7 12/18/2017   HGB 12.7 12/18/2017   HCT 37.4 12/18/2017   MCV 91.6 12/18/2017   PLT 156 12/18/2017    Assessment:  Tara Dixonis a 27 y.o.G1P0 at 2619w1d admitted for postdates induction of labor, Rh positive, GBS negative  FHR Category I  Plan:  Foley bulb inserted without difficulty.   Reviewed red flag symptoms and when to call.   Continue orders as written. Reassess as needed.    Gunnar BullaJenkins Michelle Malyn Aytes, CNM [facility], [facility] 12/18/2017, 5:39 PM

## 2017-12-18 NOTE — Progress Notes (Signed)
Patient ID: Tara Baldwin, female   DOB: 1991/07/21, 27 y.o.   MRN: [id number]  Tara Baldwin is a 27 y.o. G1P0 at 5320w1d by ultrasound admitted for induction of labor due to Post dates. Due date 12/10/2017.  Subjective:  Patient sitting in bed, breathing through contractions. Family member and doula present at bedside.   Denies difficulty breathing or respiratory distress, chest pain, vaginal bleeding, dysuria, and leg pain or swelling.   Objective:  Temp:  [98.4 F (36.9 C)-98.5 F (36.9 C)] 98.5 F (36.9 C) (03/14 1525) Pulse Rate:  [56-66] 58 (03/14 1525) Resp:  [16-18] 16 (03/14 1525) BP: (107-124)/(57-70) 120/66 (03/14 1525) Weight:  [201 lb (91.2 kg)] 201 lb (91.2 kg) (03/14 0527)  Fetal Wellbeing:  Category I   UC:   regular, every two (2) to five (5) minutes, soft resting tone  SVE:   Dilation: 1 Effacement (%): 50 Station: -2 Exam by:: Willodean RosenthalM. Gerlean Cid  Labs:  Lab Results  Component Value Date   WBC 8.7 12/18/2017   HGB 12.7 12/18/2017   HCT 37.4 12/18/2017   MCV 91.6 12/18/2017   PLT 156 12/18/2017    Assessment:  Tara Baldwin is a 27 y.o. G1P0 at 9920w1d admitted for postdates induction of labor, Rh positive, GBS negative  FHR Category I  Plan:  Discussed options for continued induction of labor including cytotec, pitocin, and/or placement of foley bulb. Patient declines at this time.   Reviewed red flag symptoms and when to call.   Continue orders as written. Reassess as needed.    Gunnar BullaJenkins Michelle Gelsey Amyx, CNM [facility], [facility] 12/18/2017, 4:22 PM

## 2017-12-18 NOTE — Progress Notes (Signed)
Patient ID: Tara Baldwin, female   DOB: 1991-03-20, 27 y.o.   MRN: [name]  Tara SouBreanna Baldwin is a 27 y.o. G1P0 at 194w1d by ultrasound admitted for induction of labor due to Post dates. Due date 12/10/2017.  Subjective:  Sitting in rocking chair, breathing with contractions. Doula and FOB at bedside.   Denies difficulty breathing or respiratory distress, chest pain, vaginal bleeding, dysuria, and leg pain or swelling.   Objective:  Temp:  [98.4 F (36.9 C)-98.6 F (37 C)] 98.6 F (37 C) (03/14 1933) Pulse Rate:  [56-69] 69 (03/14 1933) Resp:  [16-18] 16 (03/14 1933) BP: (107-124)/(57-73) 113/73 (03/14 1933) Weight:  [201 lb (91.2 kg)] 201 lb (91.2 kg) (03/14 0527)  Fetal Wellbeing:  Category I   UC:   regular, every one (1) to four (4) minutes, soft resting tone  SVE:   Dilation: 4 Effacement (%): 60 Station: -2 Exam by:: Tara Baldwin, CNM  Labs:  Lab Results  Component Value Date   WBC 8.7 12/18/2017   HGB 12.7 12/18/2017   HCT 37.4 12/18/2017   MCV 91.6 12/18/2017   PLT 156 12/18/2017    Assessment:  Tara Dixonis a 26 y.o.G1P0 at 794w1d admitted for postdates induction of labor, Rh positive, GBS negative  FHR Category I  Plan:  Change to intermittent monitoring.   Reviewed red flag symptoms and when to call.   Continue orders as written. Reassess as needed.    Tara BullaJenkins Michelle Johniya Baldwin, CNM [facility], [facility] 12/18/2017, 10:09 PM

## 2017-12-18 NOTE — H&P (Signed)
Obstetric History and Physical  Tara Baldwin is a 27 y.o. G1P0 with IUP at [redacted]w[redacted]d, by first trimester [place], for postdates induction of labor. Patient states she has been having  irregular, every contractions, none vaginal bleeding, intact membranes, with active fetal movement.    Denies difficulty breathing or respiratory distress, chest pain, abdominal pain, vaginal bleeding, dysuria, and leg pain or swelling.   Prenatal Course  Source of Care: [facility] from [facility] in [place], [place]; total visits: 14  Pregnancy complications or risks: positive HSV  Prenatal labs and studies:  ABO, Rh: --/--/B POS (03/14 0716)  Antibody: NEG (03/14 0716)  Rubella: Immune (08/10 1045)  RPR: Non Reactive (12/19 1405)   HBsAg: Negative (08/10 1045)   HIV: Non-reactive (08/10 1045)   UEA:VWUJWJXB (02/07 1611)  1 hr Glucola: 84 (12/19 1405)  Genetic screening: Declined  Anatomy US normal  Past Medical History:  Diagnosis Date  . Headache   . STD (female) 05/16/2017   chlmydia    Past Surgical History:  Procedure Laterality Date  . NO PAST SURGERIES      OB History  Gravida Para Term Preterm AB Living  1         0  SAB TAB Ectopic Multiple Live Births          0    # Outcome Date GA Lbr Len/2nd Weight Sex Delivery Anes PTL Lv  1 Current               Social History   Socioeconomic History  . Marital status: Single    Spouse name: None  . Number of children: None  . Years of education: None  . Highest education level: None  Social Needs  . Financial resource strain: None  . Food insecurity - worry: None  . Food insecurity - inability: None  . Transportation needs - medical: None  . Transportation needs - non-medical: None  Occupational History  . None  Tobacco Use  . Smoking status: Never Smoker  . Smokeless tobacco: Never Used  Substance and Sexual Activity  . Alcohol use: No  . Drug use: No  . Sexual activity: Yes  Other Topics Concern  . None   Social History Narrative  . None    Family History  Problem Relation Age of Onset  . Cancer Paternal Grandmother        Breast  . Diabetes Neg Hx     Medications Prior to Admission  Medication Sig Dispense Refill Last Dose  . acetaminophen (TYLENOL) 500 MG tablet Take 500 mg by mouth every 6 (six) hours as needed for headache.   Past Month at Unknown time  . Prenatal Multivit-Min-Fe-FA (PRENATAL VITAMINS PO) Take by mouth.   12/17/2017 at Unknown time  . valACYclovir (VALTREX) 500 MG tablet Take 1 tablet (500 mg total) by mouth 2 (two) times daily. 60 tablet 1 12/17/2017 at Unknown time  . Cholecalciferol (VITAMIN D3) 10000 units TABS Take by mouth.   Not Taking at Unknown time    No Known Allergies  Review of Systems: Negative except for what is mentioned in HPI.  Physical Exam:  Temp:  [98.4 F (36.9 C)] 98.4 F (36.9 C) (03/14 0757) Pulse Rate:  [56-66] 56 (03/14 0757) Resp:  [16-18] 16 (03/14 0757) BP: (107-124)/(57-70) 107/57 (03/14 0757) Weight:  [201 lb (91.2 kg)] 201 lb (91.2 kg) (03/14 0527)  GENERAL: Well-developed, well-nourished female in no acute distress.   LUNGS: Clear to auscultation bilaterally.   HEART:  Regular rate and rhythm.  ABDOMEN: Soft, nontender, nondistended, gravid.  EXTREMITIES: Nontender, no edema, 2+ distal pulses. Cervical Exam: Dilation: 1 Effacement (%): 40 Cervical Position: Posterior Station: -3 Exam by:: A. White, RN  FHT:  Baseline rate 125 bpm   Variability moderate  Accelerations present   Decelerations none  Contractions: Every five (5) to eight (8) minutes, soft resting tone   Pertinent Labs/Studies:    Results for orders placed or performed during the hospital encounter of 12/18/17 (from the past 24 hour(s))  Type and screen [facility]     Status: None   Collection Time: 12/18/17  7:16 AM  Result Value Ref Range   ABO/RH(D) B POS    Antibody Screen NEG    Sample Expiration       12/21/2017 Performed at [facility], [address] [place], [place] [address]   CBC     Status: None   Collection Time: 12/18/17  7:16 AM  Result Value Ref Range   WBC 8.7 3.6 - 11.0 K/uL   RBC 4.08 3.80 - 5.20 MIL/uL   Hemoglobin 12.7 12.0 - 16.0 g/dL   HCT 60.437.4 54.035.0 - 98.147.0 %   MCV 91.6 80.0 - 100.0 fL   MCH 31.2 26.0 - 34.0 pg   MCHC 34.0 32.0 - 36.0 g/dL   RDW 19.113.7 47.811.5 - 29.514.5 %   Platelets 156 150 - 440 K/uL    Assessment :  Tara Baldwin is a 27 y.o. G1P0 at 6488w1d being admitted for postdates induction of labor, Rh positive, GBS negative  Plan:  Admit to birthing suites, see orders.   Induction/Augmentation as needed, per protocol. Nursing staff to place cytotec per order set.  Reviewed red flag symptoms and when to call.   Dr. Logan BoresEvans notified of admission and plan of care.    Tara Baldwin, CNM [facility], [facility]

## 2017-12-19 ENCOUNTER — Encounter
Admission: RE | Disposition: A | Discharge status: Home / Self Care | Payer: Self-pay | Attending: Certified Nurse Midwife

## 2017-12-19 ENCOUNTER — Inpatient Hospital Stay: Payer: Managed Care, Other (non HMO) | Admitting: Anesthesiology

## 2017-12-19 ENCOUNTER — Encounter: Payer: Self-pay | Admitting: Anesthesiology

## 2017-12-19 DIAGNOSIS — O480 Post-term pregnancy: Secondary | ICD-10-CM

## 2017-12-19 DIAGNOSIS — Z3A41 41 weeks gestation of pregnancy: Secondary | ICD-10-CM

## 2017-12-19 LAB — RPR: RPR: NONREACTIVE

## 2017-12-19 SURGERY — Surgical Case
Anesthesia: Epidural | Wound class: Clean Contaminated

## 2017-12-19 MED ORDER — LIDOCAINE 5 % EX PTCH
1.0000 | MEDICATED_PATCH | CUTANEOUS | Status: DC
  Administered 2017-12-20 – 2017-12-21 (×2): 1 via TRANSDERMAL
  Filled 2017-12-19 (×3): qty 1

## 2017-12-19 MED ORDER — ONDANSETRON HCL 4 MG/2ML IJ SOLN
INTRAMUSCULAR | Status: DC | PRN
  Administered 2017-12-19: 4 mg via INTRAVENOUS

## 2017-12-19 MED ORDER — EPHEDRINE 5 MG/ML INJ: 10.0000 mg | INTRAVENOUS | Status: DC | PRN

## 2017-12-19 MED ORDER — OXYTOCIN 40 UNITS IN LACTATED RINGERS INFUSION - SIMPLE MED
INTRAVENOUS | Status: DC | PRN
  Administered 2017-12-19: 1000 mL via INTRAVENOUS

## 2017-12-19 MED ORDER — LIDOCAINE 5 % EX PTCH
MEDICATED_PATCH | CUTANEOUS | Status: DC | PRN
  Administered 2017-12-19: 1 via TRANSDERMAL

## 2017-12-19 MED ORDER — PHENYLEPHRINE 40 MCG/ML (10ML) SYRINGE FOR IV PUSH (FOR BLOOD PRESSURE SUPPORT): 80.0000 ug | PREFILLED_SYRINGE | INTRAVENOUS | Status: DC | PRN

## 2017-12-19 MED ORDER — FENTANYL CITRATE (PF) 100 MCG/2ML IJ SOLN
INTRAMUSCULAR | Status: AC
  Administered 2017-12-19: 25 ug via INTRAVENOUS
  Filled 2017-12-19: qty 2

## 2017-12-19 MED ORDER — FENTANYL 2.5 MCG/ML W/ROPIVACAINE 0.15% IN NS 100 ML EPIDURAL (ARMC)
12.0000 mL/h | EPIDURAL | Status: DC
  Administered 2017-12-19: 12 mL/h via EPIDURAL
  Filled 2017-12-19 (×2): qty 100

## 2017-12-19 MED ORDER — BUPIVACAINE HCL (PF) 0.25 % IJ SOLN
INTRAMUSCULAR | Status: DC | PRN
  Administered 2017-12-19 (×2): 4 mL via EPIDURAL

## 2017-12-19 MED ORDER — CEFAZOLIN SODIUM-DEXTROSE 2-4 GM/100ML-% IV SOLN
2.0000 g | Freq: Once | INTRAVENOUS | Status: AC
  Administered 2017-12-19: 2 g via INTRAVENOUS
  Filled 2017-12-19: qty 100

## 2017-12-19 MED ORDER — FENTANYL CITRATE (PF) 100 MCG/2ML IJ SOLN
25.0000 ug | INTRAMUSCULAR | Status: DC | PRN
  Administered 2017-12-19 (×4): 25 ug via INTRAVENOUS

## 2017-12-19 MED ORDER — DIPHENHYDRAMINE HCL 50 MG/ML IJ SOLN: 12.5000 mg | INTRAMUSCULAR | Status: DC | PRN

## 2017-12-19 MED ORDER — CARBOPROST TROMETHAMINE 250 MCG/ML IM SOLN
INTRAMUSCULAR | Status: DC | PRN
  Administered 2017-12-19: 250 ug via INTRAMUSCULAR

## 2017-12-19 MED ORDER — IBUPROFEN 800 MG PO TABS
800.0000 mg | ORAL_TABLET | Freq: Three times a day (TID) | ORAL | Status: DC
  Administered 2017-12-20 (×2): 800 mg via ORAL
  Filled 2017-12-19 (×2): qty 1

## 2017-12-19 MED ORDER — METHYLERGONOVINE MALEATE 0.2 MG PO TABS
0.2000 mg | ORAL_TABLET | Freq: Four times a day (QID) | ORAL | Status: AC
  Administered 2017-12-20 (×4): 0.2 mg via ORAL
  Filled 2017-12-19 (×8): qty 1

## 2017-12-19 MED ORDER — LACTATED RINGERS IV SOLN: 500.0000 mL | Freq: Once | INTRAVENOUS | Status: AC

## 2017-12-19 MED ORDER — ACETAMINOPHEN 325 MG PO TABS: 650.0000 mg | ORAL_TABLET | ORAL | Status: DC | PRN

## 2017-12-19 MED ORDER — SODIUM CHLORIDE FLUSH 0.9 % IV SOLN
INTRAVENOUS | Status: AC
  Administered 2017-12-19: 10 mL
  Filled 2017-12-19: qty 10

## 2017-12-19 MED ORDER — METHYLERGONOVINE MALEATE 0.2 MG/ML IJ SOLN
INTRAMUSCULAR | Status: AC
  Filled 2017-12-19: qty 1

## 2017-12-19 MED ORDER — FENTANYL 2.5 MCG/ML W/ROPIVACAINE 0.15% IN NS 100 ML EPIDURAL (ARMC)
EPIDURAL | Status: AC
  Filled 2017-12-19: qty 100

## 2017-12-19 MED ORDER — FENTANYL 2.5 MCG/ML W/ROPIVACAINE 0.15% IN NS 100 ML EPIDURAL (ARMC)
EPIDURAL | Status: DC | PRN
  Administered 2017-12-19: 12 mL/h via EPIDURAL

## 2017-12-19 MED ORDER — OXYCODONE-ACETAMINOPHEN 5-325 MG PO TABS
1.0000 | ORAL_TABLET | ORAL | Status: DC | PRN
  Administered 2017-12-20 – 2017-12-22 (×2): 1 via ORAL
  Filled 2017-12-19 (×4): qty 1

## 2017-12-19 MED ORDER — CARBOPROST TROMETHAMINE 250 MCG/ML IM SOLN
INTRAMUSCULAR | Status: AC
  Filled 2017-12-19: qty 1

## 2017-12-19 MED ORDER — MORPHINE SULFATE (PF) 0.5 MG/ML IJ SOLN
INTRAMUSCULAR | Status: AC
  Filled 2017-12-19: qty 10

## 2017-12-19 MED ORDER — LIDOCAINE HCL (PF) 2 % IJ SOLN
INTRAMUSCULAR | Status: DC | PRN
  Administered 2017-12-19: 3 mL via EPIDURAL

## 2017-12-19 MED ORDER — ONDANSETRON HCL 4 MG/2ML IJ SOLN: 4.0000 mg | Freq: Once | INTRAMUSCULAR | Status: DC | PRN

## 2017-12-19 MED ORDER — MORPHINE SULFATE (PF) 0.5 MG/ML IJ SOLN
INTRAMUSCULAR | Status: DC | PRN
  Administered 2017-12-19 (×2): 1.5 mg via EPIDURAL

## 2017-12-19 MED ORDER — METHYLERGONOVINE MALEATE 0.2 MG/ML IJ SOLN
INTRAMUSCULAR | Status: DC | PRN
  Administered 2017-12-19: .2 mg via INTRAMUSCULAR

## 2017-12-19 SURGICAL SUPPLY — 23 items
CANISTER SUCT 3000ML PPV (MISCELLANEOUS) ×3 IMPLANT
CHLORAPREP W/TINT 26ML (MISCELLANEOUS) ×3 IMPLANT
DRSG TELFA 3X8 NADH (GAUZE/BANDAGES/DRESSINGS) ×3 IMPLANT
ELECT CAUTERY BLADE TIP 2.5 (TIP) ×3
ELECT REM PT RETURN 9FT ADLT (ELECTROSURGICAL) ×3
ELECTRODE CAUTERY BLDE TIP 2.5 (TIP) ×1 IMPLANT
ELECTRODE REM PT RTRN 9FT ADLT (ELECTROSURGICAL) ×1 IMPLANT
GAUZE SPONGE 4X4 12PLY STRL (GAUZE/BANDAGES/DRESSINGS) ×3 IMPLANT
GLOVE BIO SURGEON STRL SZ8 (GLOVE) ×3 IMPLANT
GOWN STRL REUS W/ TWL LRG LVL3 (GOWN DISPOSABLE) ×2 IMPLANT
GOWN STRL REUS W/ TWL XL LVL3 (GOWN DISPOSABLE) ×1 IMPLANT
GOWN STRL REUS W/TWL LRG LVL3 (GOWN DISPOSABLE) ×4
GOWN STRL REUS W/TWL XL LVL3 (GOWN DISPOSABLE) ×2
NS IRRIG 1000ML POUR BTL (IV SOLUTION) ×3 IMPLANT
PACK C SECTION AR (MISCELLANEOUS) ×3 IMPLANT
PAD OB MATERNITY 4.3X12.25 (PERSONAL CARE ITEMS) ×3 IMPLANT
PAD PREP 24X41 OB/GYN DISP (PERSONAL CARE ITEMS) ×3 IMPLANT
STRAP SAFETY 5IN WIDE (MISCELLANEOUS) ×3 IMPLANT
SUT CHROMIC 1-0 (SUTURE) ×9 IMPLANT
SUT MAXON ABS #0 GS21 30IN (SUTURE) ×6 IMPLANT
SUT PLAIN GUT 0 (SUTURE) ×6 IMPLANT
SUT VIC AB 2-0 CT1 27 (SUTURE) ×4
SUT VIC AB 2-0 CT1 TAPERPNT 27 (SUTURE) ×2 IMPLANT

## 2017-12-19 NOTE — Anesthesia Procedure Notes (Signed)
Epidural Patient location during procedure: OB  Staffing Performed: anesthesiologist   Preanesthetic Checklist Completed: patient identified, site marked, surgical consent, pre-op evaluation, timeout performed, IV checked, risks and benefits discussed and monitors and equipment checked  Epidural Patient position: sitting Prep: Betadine Patient monitoring: heart rate, continuous pulse ox and blood pressure Approach: midline Location: L4-L5 Injection technique: LOR saline  Needle:  Needle type: Tuohy  Needle gauge: 17 G Needle length: 9 cm and 9 Needle insertion depth: 6 cm Catheter type: closed end flexible Catheter size: 19 Gauge Catheter at skin depth: 12 cm Test dose: negative and 1.5% lidocaine with Epi 1:200 K  Assessment Sensory level: T10 Events: blood not aspirated, injection not painful, no injection resistance, negative IV test and no paresthesia  Additional Notes   Patient tolerated the insertion well without complications.-SATD -IVTD. No paresthesia. Refer to [facility] nursing for VS and dosingReason for block:procedure for pain     

## 2017-12-19 NOTE — Anesthesia Post-op Follow-up Note (Signed)
Anesthesia QCDR form completed.        

## 2017-12-19 NOTE — Progress Notes (Signed)
Preop note: Preop diagnosis 1.  41.2-week intrauterine pregnancy, undelivered 2.  Arrest of dilation 3.  Fetal intolerance to labor  Patient is currently undergoing induction of labor for postdates.  She has made slow progress, but now has developed recurrent late decelerations with less than adequate uterine contractions.  Because she is remote from vaginal delivery, she is recommended to undergo primary low cervical transverse cesarean section.  PRE OP C/S COUNSELING NOTE:  The patient has been counseled regarding indications for Cesarean Section delivery. (See intra partum notes for details). She is understanding of the planned procedure and is aware of and accepting of all surgical risks which include but are not limited to: Bleeding with possible need for blood product transfusions; Infection with possible need for additional antibiotic therapy; Blood Clot Disorders with possible need for blood thinners; Pelvic organ Injury with possible need for repair (Bowel/Bladder/Ureters/Ovaries/Tubes/etc); Anesthesia risks; Fetal Injury; and additional Post Op complications including incision dehiscence and possible future pregnancy placenta implantation issues. All questions have been answered. Informed consent is given.  Tara Arth A.Beatris Sie Francesco, MD, FACOG [facility]

## 2017-12-19 NOTE — Transfer of Care (Signed)
Immediate Anesthesia Transfer of Care Note  Patient: Tara Baldwin  Procedure(s) Performed: CESAREAN SECTION (N/A )  Patient Location: PACU  Anesthesia Type:General  Level of Consciousness: awake, alert  and oriented  Airway & Oxygen Therapy: Patient Spontanous Breathing  Post-op Assessment: Report given to RN and Post -op Vital signs reviewed and stable  Post vital signs: Reviewed and stable  Last Vitals:  Vitals:   12/19/17 1947 12/19/17 2212  BP: 127/74 137/89  Pulse: 63 68  Resp:  (!) 7  Temp:  37.4 C  SpO2:  99%    Last Pain:  Vitals:   12/19/17 1916  TempSrc: Axillary  PainSc: 4       Patients Stated Pain Goal: 2 (12/19/17 1916)  Complications: No apparent anesthesia complications

## 2017-12-19 NOTE — Progress Notes (Signed)
Patient ID: Tara SouBreanna Len, female   DOB: November 21, 1990, 27 y.o.   MRN: [id number]  Tara Baldwin is a 27 y.o. G1P0 at 2153w2d by ultrasound admitted for induction of labor due to Post dates. Due date 12/10/2017.  Subjective:  Doing well, no questions or concerns.   Denies difficulty breathing or respiratory distress, chest pain, abdominal pain, vaginal bleeding, dysuria, and leg pain or swelling.   Objective:  Temp:  [97.5 F (36.4 C)-99.1 F (37.3 C)] 97.5 F (36.4 C) (03/15 1916) Pulse Rate:  [58-76] 76 (03/15 1916) Resp:  [14-16] 16 (03/15 1916) BP: (103-129)/(55-92) 118/81 (03/15 1916) SpO2:  [96 %-100 %] 100 % (03/15 1916)  Fetal Wellbeing:  Category II  UC:   regular, every one (1) to three (3) minutes, soft resting tone, pitocin at 16 mu/min  SVE:   Dilation: 7 Effacement (%): 100 Station: 0 Exam by:: L. Elks RN  Labs: Lab Results  Component Value Date   WBC 8.7 12/18/2017   HGB 12.7 12/18/2017   HCT 37.4 12/18/2017   MCV 91.6 12/18/2017   PLT 156 12/18/2017    Assessment:  Janaysia Dixonis a 26 y.o.G1P0 at 8515w1d admitted for postdates induction of labor, Rh positive, GBS negative  FHR Category II  Plan:  Continue orders as written. Reassess as needed.   Gunnar BullaJenkins Michelle Berniece Abid, CNM [facility], [facility] 12/19/2017, 7:25 PM

## 2017-12-19 NOTE — Progress Notes (Signed)
Patient ID: Tara SouBreanna Reppond, female   DOB: May 13, 1991, 27 y.o.   MRN: [id number]  Tara Baldwin is a 27 y.o. G1P0 at 7260w2d by ultrasound admitted for induction of labor due to Post dates. Due date 12/10/2017.  Subjective:  Patient doing well, reports relief since epidural placement. Doula and FOB at bedside for continuous labor support.   Denies difficulty breathing or respiratory distress, chest pain, abdominal pain, vaginal bleeding, dysuria, and leg pain or swelling.   Objective:  Temp:  [98.2 F (36.8 C)-98.6 F (37 C)] 98.2 F (36.8 C) (03/15 0746) Pulse Rate:  [58-74] 58 (03/15 0630) Resp:  [14-16] 14 (03/15 0431) BP: (104-129)/(55-92) 104/55 (03/15 0630) SpO2:  [96 %-100 %] 98 % (03/15 0711)  Fetal Wellbeing:  Category I  UC:   regular, every one (1) to five (5) minutes, soft resting tone, pitocin infusing 1 mu/min  SVE:   Dilation: 5 Effacement (%): 70 Station: -1 Exam by:: [employer] ContainerJohnson RN  Labs: Lab Results  Component Value Date   WBC 8.7 12/18/2017   HGB 12.7 12/18/2017   HCT 37.4 12/18/2017   MCV 91.6 12/18/2017   PLT 156 12/18/2017    Assessment:   Tara Dixonis a 26 y.o.G1P0 at 1211w1d admitted for postdates induction of labor, Rh positive, GBS negative  FHR Category I  Plan:  AROM small amount of clear fluid, IUPC placed without difficulty.   Reviewed red flag symptoms and when to call.   Continue orders as written. Reassess as needed.    Gunnar BullaJenkins Michelle Hoyt Leanos, CNM [facility], [facility] 12/19/2017, 8:35 AM

## 2017-12-19 NOTE — Progress Notes (Signed)
Patient ID: Tara Baldwin, female   DOB: 10-Feb-1991, 27 y.o.   MRN: [id number]  Tara Baldwin is a 27 y.o. G1P0 at 2737w2d by ultrasound admitted for induction of labor due to Post dates. Due date 12/10/2017.  Subjective:  Doing well, no questions or concerns. Family members and doula present at bedside.   Denies difficulty breathing or respiratory distress, chest pain, abdominal pain, vaginal bleeding, dysuria, and leg pain or swelling.   Objective:  Temp:  [98.2 F (36.8 C)-99.1 F (37.3 C)] 99.1 F (37.3 C) (03/15 1351) Pulse Rate:  [58-74] 65 (03/15 1351) Resp:  [14-16] 14 (03/15 0431) BP: (103-129)/(55-92) 123/66 (03/15 1351) SpO2:  [96 %-100 %] 99 % (03/15 1351)  Fetal Wellbeing:  Category I   UC:   regular, every one (1) to three (3) minutes, soft resting tone, pitocin at 10 mu/min  SVE:   Dilation: 5.5 Effacement (%): 90, 100 Station: -1 Exam by:: [employer]Johnson RN  Labs: Lab Results  Component Value Date   WBC 8.7 12/18/2017   HGB 12.7 12/18/2017   HCT 37.4 12/18/2017   MCV 91.6 12/18/2017   PLT 156 12/18/2017    Assessment:  Tara Baldwin a 27 y.o.G1P0 at 577w1d admitted for postdates induction of labor, Rh positive, GBS negative  FHR Category I  Plan:  Encouraged rest and position change.   Reviewed red flag symptoms and when to call.    Tara Baldwin, CNM [facility], [facility] 12/19/2017, 2:14 PM

## 2017-12-19 NOTE — Anesthesia Preprocedure Evaluation (Signed)
Anesthesia Evaluation  Patient identified by MRN, date of birth, ID band Patient awake    Reviewed: Allergy & Precautions, H&P , NPO status , Patient's Chart, lab work & pertinent test results, reviewed documented beta blocker date and time   Airway Mallampati: II  TM Distance: >3 FB Neck ROM: full    Dental no notable dental hx. (+) Teeth Intact   Pulmonary neg pulmonary ROS, Current Smoker,    Pulmonary exam normal breath sounds clear to auscultation       Cardiovascular Exercise Tolerance: Good negative cardio ROS   Rhythm:regular Rate:Normal     Neuro/Psych  Headaches, negative neurological ROS  negative psych ROS   GI/Hepatic negative GI ROS, Neg liver ROS,   Endo/Other  negative endocrine ROSdiabetes  Renal/GU      Musculoskeletal   Abdominal   Peds  Hematology negative hematology ROS (+)   Anesthesia Other Findings   Reproductive/Obstetrics (+) Pregnancy                             Anesthesia Physical Anesthesia Plan  ASA: II  Anesthesia Plan: Epidural   Post-op Pain Management:    Induction:   PONV Risk Score and Plan:   Airway Management Planned:   Additional Equipment:   Intra-op Plan:   Post-operative Plan:   Informed Consent: I have reviewed the patients History and Physical, chart, labs and discussed the procedure including the risks, benefits and alternatives for the proposed anesthesia with the patient or authorized representative who has indicated his/her understanding and acceptance.       Plan Discussed with:   Anesthesia Plan Comments:         Anesthesia Quick Evaluation  

## 2017-12-19 NOTE — Care Management (Signed)
Epidural adjusted, 10 mg of lidocaine thru catheter. Another 10 mg 2% added. Pt comfortable.

## 2017-12-20 LAB — CBC
HEMATOCRIT: 30.6 % — AB (ref 35.0–47.0)
Hemoglobin: 10.4 g/dL — ABNORMAL LOW (ref 12.0–16.0)
MCH: 31.1 pg (ref 26.0–34.0)
MCHC: 33.9 g/dL (ref 32.0–36.0)
MCV: 91.9 fL (ref 80.0–100.0)
Platelets: 129 10*3/uL — ABNORMAL LOW (ref 150–440)
RBC: 3.33 MIL/uL — ABNORMAL LOW (ref 3.80–5.20)
RDW: 13.7 % (ref 11.5–14.5)
WBC: 15.6 10*3/uL — AB (ref 3.6–11.0)

## 2017-12-20 MED ORDER — KETOROLAC TROMETHAMINE 30 MG/ML IJ SOLN
30.0000 mg | Freq: Once | INTRAMUSCULAR | Status: AC
  Administered 2017-12-20: 30 mg via INTRAVENOUS

## 2017-12-20 MED ORDER — SIMETHICONE 80 MG PO CHEW
80.0000 mg | CHEWABLE_TABLET | Freq: Three times a day (TID) | ORAL | Status: DC
  Administered 2017-12-20 – 2017-12-22 (×8): 80 mg via ORAL
  Filled 2017-12-20 (×7): qty 1

## 2017-12-20 MED ORDER — LACTATED RINGERS IV SOLN: INTRAVENOUS | Status: DC

## 2017-12-20 MED ORDER — DIPHENHYDRAMINE HCL 25 MG PO CAPS
25.0000 mg | ORAL_CAPSULE | Freq: Four times a day (QID) | ORAL | Status: DC | PRN
  Filled 2017-12-20: qty 1

## 2017-12-20 MED ORDER — FERROUS SULFATE 325 (65 FE) MG PO TABS
325.0000 mg | ORAL_TABLET | Freq: Three times a day (TID) | ORAL | Status: DC
  Administered 2017-12-20 – 2017-12-22 (×7): 325 mg via ORAL
  Filled 2017-12-20 (×7): qty 1

## 2017-12-20 MED ORDER — MENTHOL 3 MG MT LOZG
1.0000 | LOZENGE | OROMUCOSAL | Status: DC | PRN
  Filled 2017-12-20: qty 9

## 2017-12-20 MED ORDER — NALBUPHINE HCL 10 MG/ML IJ SOLN
2.5000 mg | INTRAMUSCULAR | Status: DC | PRN
  Administered 2017-12-20 (×2): 2.5 mg via INTRAVENOUS
  Filled 2017-12-20 (×2): qty 1

## 2017-12-20 MED ORDER — PRENATAL MULTIVITAMIN CH
1.0000 | ORAL_TABLET | Freq: Every day | ORAL | Status: DC
  Administered 2017-12-20 – 2017-12-22 (×3): 1 via ORAL
  Filled 2017-12-20 (×3): qty 1

## 2017-12-20 MED ORDER — OXYTOCIN 40 UNITS IN LACTATED RINGERS INFUSION - SIMPLE MED
2.5000 [IU]/h | INTRAVENOUS | Status: DC
  Administered 2017-12-20: 2.5 [IU]/h via INTRAVENOUS
  Filled 2017-12-20 (×3): qty 1000

## 2017-12-20 MED ORDER — OXYCODONE-ACETAMINOPHEN 5-325 MG PO TABS
2.0000 | ORAL_TABLET | ORAL | Status: DC | PRN
  Administered 2017-12-20 – 2017-12-22 (×9): 2 via ORAL
  Filled 2017-12-20 (×9): qty 2

## 2017-12-20 MED ORDER — WITCH HAZEL-GLYCERIN EX PADS: 1.0000 "application " | MEDICATED_PAD | CUTANEOUS | Status: DC | PRN

## 2017-12-20 MED ORDER — SIMETHICONE 80 MG PO CHEW
80.0000 mg | CHEWABLE_TABLET | ORAL | Status: DC
  Filled 2017-12-20: qty 1

## 2017-12-20 MED ORDER — COCONUT OIL OIL
1.0000 "application " | TOPICAL_OIL | Status: DC | PRN
  Filled 2017-12-20: qty 120

## 2017-12-20 MED ORDER — SIMETHICONE 80 MG PO CHEW
80.0000 mg | CHEWABLE_TABLET | ORAL | Status: DC | PRN
  Administered 2017-12-22: 80 mg via ORAL
  Filled 2017-12-20: qty 1

## 2017-12-20 MED ORDER — FERROUS SULFATE 325 (65 FE) MG PO TABS
325.0000 mg | ORAL_TABLET | Freq: Two times a day (BID) | ORAL | Status: DC
  Administered 2017-12-20: 325 mg via ORAL
  Filled 2017-12-20: qty 1

## 2017-12-20 MED ORDER — MEASLES, MUMPS & RUBELLA VAC ~~LOC~~ INJ
0.5000 mL | INJECTION | Freq: Once | SUBCUTANEOUS | Status: DC
  Filled 2017-12-20: qty 0.5

## 2017-12-20 MED ORDER — IBUPROFEN 800 MG PO TABS
800.0000 mg | ORAL_TABLET | Freq: Three times a day (TID) | ORAL | Status: DC
  Administered 2017-12-21 – 2017-12-22 (×5): 800 mg via ORAL
  Filled 2017-12-20 (×5): qty 1

## 2017-12-20 MED ORDER — TETANUS-DIPHTH-ACELL PERTUSSIS 5-2.5-18.5 LF-MCG/0.5 IM SUSP: 0.5000 mL | Freq: Once | INTRAMUSCULAR | Status: DC

## 2017-12-20 MED ORDER — SENNOSIDES-DOCUSATE SODIUM 8.6-50 MG PO TABS
2.0000 | ORAL_TABLET | ORAL | Status: DC
  Administered 2017-12-20 – 2017-12-22 (×3): 2 via ORAL
  Filled 2017-12-20 (×3): qty 2

## 2017-12-20 MED ORDER — DIBUCAINE 1 % RE OINT: 1.0000 "application " | TOPICAL_OINTMENT | RECTAL | Status: DC | PRN

## 2017-12-20 MED ORDER — KETOROLAC TROMETHAMINE 30 MG/ML IJ SOLN
INTRAMUSCULAR | Status: AC
  Administered 2017-12-20: 30 mg via INTRAVENOUS
  Filled 2017-12-20: qty 1

## 2017-12-20 NOTE — Op Note (Signed)
[name] PROCEDURE DATE: 12/20/2017  PREOPERATIVE DIAGNOSES: Intrauterine pregnancy at 7571w2d weeks gestation; 41.2-week intrauterine pregnancy, undelivered; arrest of dilation; nonreassuring fetal heart rate tracing  POSTOPERATIVE DIAGNOSES: The same and viable female infant and uterine atony  PROCEDURE: Primary Low Transverse Cesarean Section  ANESTHESIA:: Epidural ANESTHESIOLOGIST: Dr. Maisie Fushomas  SURGEON:  Dr. Daphine DeutscherMartin A. Jamari Diana ASSISTANT:: Serafina RoyalsMichelle Lawhorn, certified nurse midwife    INDICATIONS: Tara Baldwin is a 27 y.o. G1P1001 at 2771w2d here for cesarean section delivery due to the indications listed under preoperative diagnoses; please see preoperative note for further details.  The risks of cesarean section were discussed with the patient including but not limited to: bleeding which may require transfusion or reoperation; infection which may require antibiotics; injury to bowel, bladder, ureters or other surrounding organs; injury to the fetus; need for additional procedures including hysterectomy in the event of a life-threatening hemorrhage; placental abnormalities wth subsequent pregnancies, incisional problems, thromboembolic phenomenon and other postoperative/anesthesia complications. The patient concurred with the proposed plan, giving informed written consent for the procedure. All questions were addressed.  FINDINGS:  Viable female infant in cephalic presentation.  Apgars 8 and 9.  Clear amniotic fluid.  Intact placenta, three vessel cord.  Normal uterus, fallopian tubes and ovaries bilaterally.  During expression of placenta, moderate adherence was noted and the uterus was temporarily inverted during placenta removal, and immediate eversion technique was employed with restoration of normal anatomy.  The uterus was noted to have significant boggy tone throughout the  operative procedure and uterotonic agents including additional Methergine and Hemabate were given; the intrinsic  lack of responsiveness to uterotonic agents helps to explain why Pitocin augmentation was not successful as adequate [place] units were never achieved during induction.  I/O's: Total I/O In: 240 [P.O.:240] Out: 2050 [Urine:2050] SPECIMENS:: None ANTIBIOTIC PROPHYLAXIS: Ancef 2 grams COUNTS:  YES COMPLICATIONS: None immediate  PROCEDURE IN DETAIL: Patient was brought to the operating room where she was placed in the sitting position.  Epidural anesthesia was optimized without difficulty. She was placed in the supine position with a right lateral hip roll in place. Foley catheter  was draining clear yellow urine. The abdomen was prepped with ChloraPrep solution and draped in a sterile manner. After timeout and checking for adequate level of anesthesia the procedure was started. Pfannenstiel incision was made into the abdomen. The fascia was incised transversely and extended bilaterally with Mayo scissors.  The midline raphae was identified and separated and the peritoneum was entered.  Transiently the bladder was noted to be distended due to incomplete bladder emptying, and the catheter was repositioned to effect adequate emptying;bladder flap was created over lower uterine segment with sharp dissection. A low transverse incision was made in the uterus and this was extended cephalad and caudad in standard fashion. Amniotic fluid was clear. The infant was delivered through vertex presentation and was noted to be active at birth. The umbilical cord was doubly clamped and cut and the infant was handed off to the resuscitating team. Cord blood sampling was obtained. Placenta was expressed from the uterus. Uterus was externalized onto the anterior abdominal wall and was cleared of all debris with laps. The uterine incision was closed in 2 layers using #1 chromic suture.The first layer was closed in a running locking manner. The second layer was an imbricating layer. Good hemostasis was noted. The uterus was  then placed back into the abdominal pelvic cavity. Gutters were cleared of all debris with laps. The incision was then closed in layers. 0  Maxon was used on the fascia in a simple running manner. The skin was closed with a subcuticular stitch of 4-0 Vicryl. Steri-Strips were placed. A Lidoderm patch was applied for analgesia. The patient was then mobilized and taken to the recovery room in satisfactory condition.      Skylin Kennerson A. Beatris Si, MD, FACOG [facility]

## 2017-12-20 NOTE — Progress Notes (Signed)
Patient ID: Tara Baldwin, female   DOB: 22-Oct-1990, 27 y.o.   MRN: [id number]  Post Partum Day # 1, s/p SVD  Subjective:  Doing well, passing gas. Pain managed with oral medications.   Denies difficulty breathing or respiratory distress, chest pain, abdominal pain, excessive vaginal bleeding, dysuria, and leg pain or swelling.   Objective: Temp:  [97.5 F (36.4 C)-99.3 F (37.4 C)] 98.6 F (37 C) (03/16 0643) Pulse Rate:  [59-109] 85 (03/16 0643) Resp:  [0-32] 16 (03/16 0643) BP: (114-156)/(54-95) 114/54 (03/16 0643) SpO2:  [95 %-100 %] 98 % (03/16 40980643)  Physical Exam:   General: alert and cooperative   Lungs: clear to auscultation bilaterally  Breasts: deferred, no complaints  Heart: regular rate and rhythm, S1, S2 normal, no murmur, click, rub or gallop  Abdomen: soft, non-tender; bowel sounds normal; no masses,  no organomegaly  Pelvis: Lochia: appropriate, Uterine  Fundus: firm  Extremities: DVT Evaluation: No evidence of DVT seen on physical exam. Negative Homan's sign.  Recent Labs    12/18/17 0716 12/20/17 0559  HGB 12.7 10.4*  HCT 37.4 30.6*    Assessment/Plan: Breastfeeding  Anemia due to blood loss Status post c-section Rh positive    LOS: 2 days   Gunnar BullaJenkins Michelle Jurnie Garritano, CNM [facility] 12/20/2017 9:09 AM

## 2017-12-20 NOTE — Anesthesia Post-op Follow-up Note (Signed)
  Anesthesia Pain Follow-up Note  Patient: Tara Baldwin  Day #: 1  Date of Follow-up: 12/20/2017 Time: 8:43 AM  Last Vitals:  Vitals:   12/20/17 0300 12/20/17 0643  BP: 122/74 (!) 114/54  Pulse: 70 85  Resp: 16 16  Temp: 36.7 C 37 C  SpO2: 100% 98%    Level of Consciousness: alert  Pain: mild   Side Effects:Pruritis  Catheter Site Exam:clean, dry     Plan: D/C from anesthesia care at surgeon's request  Nalbuphine ordered PRN for itching  Mercia Dowe

## 2017-12-20 NOTE — Anesthesia Postprocedure Evaluation (Signed)
Anesthesia Post Note  Patient: Placido SouBreanna Schamp  Procedure(s) Performed: CESAREAN SECTION (N/A )  Patient location during evaluation: Mother Baby Anesthesia Type: Epidural Level of consciousness: awake and alert and oriented Pain management: pain level controlled Vital Signs Assessment: post-procedure vital signs reviewed and stable Respiratory status: spontaneous breathing, nonlabored ventilation and respiratory function stable Cardiovascular status: stable Postop Assessment: no headache, no backache, epidural receding and no signs of nausea or vomiting (moderate pruritis) Anesthetic complications: no     Last Vitals:  Vitals:   12/20/17 0300 12/20/17 0643  BP: 122/74 (!) 114/54  Pulse: 70 85  Resp: 16 16  Temp: 36.7 C 37 C  SpO2: 100% 98%    Last Pain:  Vitals:   12/20/17 0745  TempSrc:   PainSc: 4                  Kenzly Rogoff

## 2017-12-21 ENCOUNTER — Encounter: Payer: Self-pay | Admitting: Obstetrics and Gynecology

## 2017-12-21 NOTE — Progress Notes (Signed)
Patient ID: Tara Baldwin, female   DOB: [birth year], 27 y.o.   MRN: [id number]  Post Partum Day # 2, s/p SVD  Subjective:  Doing well. Eating and drinking without difficulty. Pain managed on oral medications.   Denies difficulty breathing or respiratory distress, chest pain, severe abdominal pain, excessive vaginal bleeding, dysuria, and leg pain.   Objective:  Temp:  [98.1 F (36.7 C)-99.6 F (37.6 C)] 99.6 F (37.6 C) (03/17 0811) Pulse Rate:  [79-85] 79 (03/17 0811) Resp:  [16-20] 20 (03/17 0811) BP: (99-117)/(54-61) 117/61 (03/17 0811) SpO2:  [97 %-100 %] 100 % (03/17 0811)  Physical Exam:   General: alert and cooperative   Lungs: clear to auscultation bilaterally  Breasts: deferred, no complaints  Heart: regular rate and rhythm, S1, S2 normal, no murmur, click, rub or gallop  Abdomen: soft, non-tender; bowel sounds normal; no masses,  no organomegaly  Pelvis: Lochia: appropriate, Uterine  Fundus: firm  Extremities: DVT Evaluation: No evidence of DVT seen on physical exam.  Recent Labs    12/20/17 0559  HGB 10.4*  HCT 30.6*    Assessment/Plan: Plan for discharge tomorrow and Breastfeeding with formula supplementation   LOS: 3 days    Tara Baldwin, CNM [facility] 12/21/2017 1:55 PM

## 2017-12-21 NOTE — Lactation Note (Signed)
This note was copied from a baby's chart. Lactation Consultation Note  Patient Name: Boy Placido SouBreanna Frey ZOXWR'UToday's Date: 12/21/2017 Reason for consult: Follow-up assessment;Primapara Mom has hard areola with inverted nipples. Baby's first feeding was given via bottle by grandmother of baby Rush Barer(Gerber 25 ml).  Jacquenette ShoneJulian does not open mouth very wide and keeps tight jaw.  Assisted mom with breast feeding using #20 nipple shield with formula in tip of nipple shield to entice him to latch.  Started mom pumping with Symphony pump to help evert nipples and to stimulate milk flow. Shells also given to help with nipple eversion. Demonstrated hand expression.  No milk was expressed with hand expression or with pumping bilaterally.  Mom has been letting FOB feed Daron OfferGerber Goodstart via bottle with slow flow nipple larger amounts than demonstrated by lactation on colostrum and snappie bottles.  Mom has lots of visitors which is delaying her pumping when FOB gives bottle.  Mom is not aggressive with breast feeding or pumping.  Discussed supply and demand and need to stimulate breast by breast feeding or pumping every 2 to 3 hours.  Explained routine newborn feeding patterns and normal course of lactation.  Lactation name and number is written on white board and have encouraged mom to call for questions, concerns or assistance with breast feeding or pumping.    Maternal Data Formula Feeding for Exclusion: No Has patient been taught Hand Expression?: Yes Does the patient have breastfeeding experience prior to this delivery?: No  Feeding Feeding Type: Bottle Fed - Formula Nipple Type: Slow - flow  LATCH Score                   Interventions Interventions: Coconut oil;Shells;DEBP  Lactation Tools Discussed/Used Tools: Shells;Pump;Coconut oil;Comfort gels;Bottle Nipple shield size: 20 Shell Type: Inverted Breast pump type: Double-Electric Breast Pump WIC Program: No([employer]) Date initiated::  12/20/17   Consult Status Consult Status: PRN    Louis MeckelWilliams, Avyan Livesay Kay 12/21/2017, 5:18 PM

## 2017-12-22 ENCOUNTER — Ambulatory Visit: Payer: Self-pay

## 2017-12-22 MED ORDER — WHITE PETROLATUM EX OINT
TOPICAL_OINTMENT | CUTANEOUS | Status: AC
  Filled 2017-12-22: qty 28.35

## 2017-12-22 MED ORDER — SIMETHICONE 80 MG PO CHEW: 80.0000 mg | CHEWABLE_TABLET | Freq: Three times a day (TID) | ORAL | 0 refills | Status: DC

## 2017-12-22 MED ORDER — IBUPROFEN 800 MG PO TABS: 800.0000 mg | ORAL_TABLET | Freq: Three times a day (TID) | ORAL | 0 refills | Status: DC

## 2017-12-22 MED ORDER — FERROUS SULFATE 325 (65 FE) MG PO TABS: 325.0000 mg | ORAL_TABLET | Freq: Three times a day (TID) | ORAL | 3 refills | Status: DC

## 2017-12-22 MED ORDER — OXYCODONE-ACETAMINOPHEN 5-325 MG PO TABS: 1.0000 | ORAL_TABLET | ORAL | 0 refills | Status: DC | PRN

## 2017-12-22 NOTE — Progress Notes (Signed)
Patient discharged home with infant. Discharge instructions, prescriptions and follow up appointment given to and reviewed with patient. Patient verbalized understanding. Patient wheeled out with infant by auxiliary.  

## 2017-12-22 NOTE — Lactation Note (Signed)
This note was copied from a baby's chart. Lactation Consultation Note  Patient Name: Tara Baldwin WUJWJ'XToday's Date: 12/22/2017     Maternal Data  Mom offered breast without nipple shield over night she reports, baby did not latch, not consistently pumping breasts, states she is not obtaining drops or any amt of colostrum when she does pump  Feeding  Assisted with helping baby latch to breast with 20 mm nipple shield, baby latched in football hold but pinched in lips, tight jaw, nursed approx. 5 min, mom yelled with pain and wanted to stop feeding, noted cl. Colostrum in nipple shield, swallows heard, dad fed baby 28 cc Tara Baldwin per bottle.   LATCH Score                   Interventions  Mom encouraged to pump breasts every 3 hrs to increase milk production and prevent engorgement, shown how to use manual Medela breast pump  As her parents took her breast pump home unknowingly.,  Attempt feeding baby at breast with nipple shield first before offering formula if she continues to want to breastfeed, pump breasts after at least every 3 hrs.   Lactation Tools Discussed/Used     Consult Status  complete    Tara Baldwin 12/22/2017, 3:38 PM

## 2017-12-22 NOTE — Discharge Summary (Signed)
Obstetric Discharge Summary  Patient ID: Tara Baldwin MRN: [id number] DOB/AGE: 1991-08-21 26 y.o.   Date of Admission: 12/18/2017 [name], CNM Charlena Cross, MD)  Date of Discharge:  12/22/17 [name], CNM Charlena Cross, MD)   Admitting Diagnosis: Induction of labor at [redacted]w[redacted]d  Secondary Diagnosis: Postpartum anemia  Mode of Delivery: primary cesarean section due to fetal intolerance to labor, arrest of dilation      Discharge Diagnosis: Failed induction of labor   Intrapartum Procedures: Atificial rupture of membranes, epidural, pitocin augmentation and placement of intrauterine catheter   Post partum procedures: None  Complications: none   Brief Hospital Course    Tara Baldwin is a G1P1001 who underwent cesarean section on 12/19/2017.  Patient had an uncomplicated surgery; for further details of this surgery, please refer to the operative note.  Patient had an uncomplicated postpartum course.  By time of discharge on POD#3, her pain was controlled on oral pain medications; she had appropriate lochia and was ambulating, voiding without difficulty, tolerating regular diet and passing flatus.   She was deemed stable for discharge to home.    Labs:  CBC Latest Ref Rng & Units 12/20/2017 12/18/2017 12/14/2017  WBC 3.6 - 11.0 K/uL 15.6(H) 8.7 8.3  Hemoglobin 12.0 - 16.0 g/dL 10.4(L) 12.7 13.4  Hematocrit 35.0 - 47.0 % 30.6(L) 37.4 37.9  Platelets 150 - 440 K/uL 129(L) 156 163   B POS  Physical exam:   Temp:  [98 F (36.7 C)-99 F (37.2 C)] 98 F (36.7 C) (03/18 0720) Pulse Rate:  [68-73] 72 (03/18 0720) Resp:  [18] 18 (03/18 0720) BP: (103-118)/(59-65) 103/59 (03/18 0720) SpO2:  [100 %] 100 % (03/18 0720)  General: alert and no distress  Lochia: appropriate  Abdomen: soft, NT  Uterine Fundus: firm  Incision: healing well, no significant drainage, no dehiscence, no significant erythema  Extremities: No evidence of DVT seen on physical exam. No lower  extremity edema.  Discharge Instructions: Per After Visit Summary.  Activity: Advance as tolerated. Pelvic rest for 6 weeks.  Also refer to After Visit Summary  Diet: Regular  Medications:  Allergies as of 12/22/2017   No Known Allergies     Medication List    STOP taking these medications   acetaminophen 500 MG tablet Commonly known as:  TYLENOL   valACYclovir 500 MG tablet Commonly known as:  VALTREX     TAKE these medications   ferrous sulfate 325 (65 FE) MG tablet Take 1 tablet (325 mg total) by mouth 3 (three) times daily with meals.   ibuprofen 800 MG tablet Commonly known as:  ADVIL,MOTRIN Take 1 tablet (800 mg total) by mouth every 8 (eight) hours.   oxyCODONE-acetaminophen 5-325 MG tablet Commonly known as:  PERCOCET/ROXICET Take 1 tablet by mouth every 4 (four) hours as needed (pain scale 4-7).   PRENATAL VITAMINS PO Take by mouth.   simethicone 80 MG chewable tablet Commonly known as:  MYLICON Chew 1 tablet (80 mg total) by mouth 3 (three) times daily after meals.   Vitamin D3 10000 units Tabs Take by mouth.      Outpatient follow up:  Follow-up Information    Gunnar Bulla, CNM. Call.   Specialties:  Certified Nurse Midwife, Obstetrics and Gynecology, Radiology Why:  Please call to schedule incision check for Friday with JML.  Contact information: [address] [place] [address] [phone]          Postpartum contraception: abstinence  Discharged Condition: stable  Discharged to: home   Newborn Data:  Disposition:home with mother  Apgars: APGAR (1 MIN): 9   APGAR (5 MINS): 9    Baby Feeding: Bottle and Breast   Gunnar BullaJenkins Michelle Anis Cinelli, CNM [facility], [facility]

## 2017-12-26 ENCOUNTER — Ambulatory Visit (INDEPENDENT_AMBULATORY_CARE_PROVIDER_SITE_OTHER): Payer: Managed Care, Other (non HMO) | Admitting: Certified Nurse Midwife

## 2017-12-26 VITALS — BP 111/66 | HR 67 | Ht 66.0 in | Wt 183.1 lb

## 2017-12-26 DIAGNOSIS — Z5189 Encounter for other specified aftercare: Secondary | ICD-10-CM

## 2017-12-26 DIAGNOSIS — Z98891 History of uterine scar from previous surgery: Secondary | ICD-10-CM

## 2017-12-26 NOTE — Progress Notes (Signed)
Pt is here for an incision check. 

## 2017-12-26 NOTE — Patient Instructions (Signed)

## 2017-12-29 ENCOUNTER — Telehealth: Payer: Self-pay

## 2017-12-29 NOTE — Telephone Encounter (Signed)
Informed pt disability papers faxed and confirmation or receipt received

## 2017-12-30 DIAGNOSIS — Z98891 History of uterine scar from previous surgery: Secondary | ICD-10-CM | POA: Insufficient documentation

## 2017-12-30 NOTE — Progress Notes (Signed)
    OBSTETRICS/GYNECOLOGY POST-OPERATIVE CLINIC VISIT  Subjective:     Tara Baldwin is a [age] y.o. female who presents to the clinic 1 weeks status post primary c-section for fetal intolerance to labor. Eating a regular diet without difficulty. Bowel movements are normal. Pain is controlled with current analgesics. Medications being used: ibuprofen (OTC) and narcotic analgesics including hydrocodone/acetaminophen (Lorcet, Lortab, Norco, Vicodin).   Denies difficulty breathing or respiratory distress, chest pain, severe abdominal pain, excessive vaginal bleeding, dysuria, and leg pain or swelling.   The following portions of the patient's history were reviewed and updated as appropriate: allergies, current medications, past family history, past medical history, past social history, past surgical history and problem list.  Review of Systems Pertinent items are noted in HPI.    Objective:    BP 111/66   Pulse 67   Ht 5\' 6"  (1.676 m)   Wt 183 lb 2 oz (83.1 kg)   Breastfeeding? Yes   BMI 29.56 kg/m  General:  alert and no distress  Abdomen: soft, bowel sounds active, non-tender  Incision:   healing well, no drainage, no erythema, no hernia, no seroma, no swelling, no dehiscence, incision well approximated    Pathology:    Assessment:    Doing well postoperatively.   Plan:   1. Continue any current medications. 2. Wound care discussed. 3. Course of labor and decision for c-section reviewed again.  4. Activity restrictions: no lifting more than ten (10) pounds 5. Anticipated return to work: not applicable. 6. Follow up: 5 weeks for PPV with JML or sooner if needed.    Gunnar BullaJenkins Michelle Rosary Filosa, CNM [facility], [facility]

## 2018-01-30 ENCOUNTER — Encounter: Payer: Self-pay | Admitting: Certified Nurse Midwife

## 2018-01-30 ENCOUNTER — Ambulatory Visit (INDEPENDENT_AMBULATORY_CARE_PROVIDER_SITE_OTHER): Payer: Managed Care, Other (non HMO) | Admitting: Certified Nurse Midwife

## 2018-01-30 DIAGNOSIS — A64 Unspecified sexually transmitted disease: Secondary | ICD-10-CM

## 2018-01-30 MED ORDER — NORETHIN ACE-ETH ESTRAD-FE 1-20 MG-MCG PO TABS: 1.0000 | ORAL_TABLET | Freq: Every day | ORAL | 11 refills | Status: DC

## 2018-01-30 NOTE — Patient Instructions (Addendum)
Ethinyl Estradiol; Norethindrone Acetate; Ferrous fumarate tablets or capsules What is this medicine? ETHINYL ESTRADIOL; NORETHINDRONE ACETATE; FERROUS FUMARATE (ETH in il es tra DYE ole; nor eth IN drone AS e tate; FER us FUE ma rate) is an oral contraceptive. The products combine two types of female hormones, an estrogen and a progestin. They are used to prevent ovulation and pregnancy. Some products are also used to treat acne in females. This medicine may be used for other purposes; ask your health care provider or pharmacist if you have questions. COMMON BRAND NAME(S): Blisovi 24 Fe, Blisovi Fe, Estrostep Fe, Gildess 24 Fe, Gildess Fe 1.5/30, Gildess Fe 1/20, Junel Fe 1.5/30, Junel Fe 1/20, Junel Fe 24, Larin Fe, Lo Loestrin Fe, Loestrin 24 Fe, Loestrin FE 1.5/30, Loestrin FE 1/20, Lomedia 24 Fe, Microgestin 24 Fe, Microgestin Fe 1.5/30, Microgestin Fe 1/20, Tarina Fe 1/20, Taytulla, Tilia Fe, Tri-Legest Fe What should I tell my health care provider before I take this medicine? They need to know if you have any of these conditions: -abnormal vaginal bleeding -blood vessel disease -breast, cervical, endometrial, ovarian, liver, or uterine cancer -diabetes -gallbladder disease -heart disease or recent heart attack -high blood pressure -high cholesterol -history of blood clots -kidney disease -liver disease -migraine headaches -smoke tobacco -stroke -systemic lupus erythematosus (SLE) -an unusual or allergic reaction to estrogens, progestins, other medicines, foods, dyes, or preservatives -pregnant or trying to get pregnant -breast-feeding How should I use this medicine? Take this medicine by mouth. To reduce nausea, this medicine may be taken with food. Follow the directions on the prescription label. Take this medicine at the same time each day and in the order directed on the package. Do not take your medicine more often than directed. A patient package insert for the product will be  given with each prescription and refill. Read this sheet carefully each time. The sheet may change frequently. Contact your pediatrician regarding the use of this medicine in children. Special care may be needed. This medicine has been used in female children who have started having menstrual periods. Overdosage: If you think you have taken too much of this medicine contact a poison control center or emergency room at once. NOTE: This medicine is only for you. Do not share this medicine with others. What if I miss a dose? If you miss a dose, refer to the patient information sheet you received with your medicine for direction. If you miss more than one pill, this medicine may not be as effective and you may need to use another form of birth control. What may interact with this medicine? Do not take this medicine with the following medication: -dasabuvir; ombitasvir; paritaprevir; ritonavir -ombitasvir; paritaprevir; ritonavir This medicine may also interact with the following medications: -acetaminophen -antibiotics or medicines for infections, especially rifampin, rifabutin, rifapentine, and griseofulvin, and possibly penicillins or tetracyclines -aprepitant -ascorbic acid (vitamin C) -atorvastatin -barbiturate medicines, such as phenobarbital -bosentan -carbamazepine -caffeine -clofibrate -cyclosporine -dantrolene -doxercalciferol -felbamate -grapefruit juice -hydrocortisone -medicines for anxiety or sleeping problems, such as diazepam or temazepam -medicines for diabetes, including pioglitazone -mineral oil -modafinil -mycophenolate -nefazodone -oxcarbazepine -phenytoin -prednisolone -ritonavir or other medicines for HIV infection or AIDS -rosuvastatin -selegiline -soy isoflavones supplements -St. John's wort -tamoxifen or raloxifene -theophylline -thyroid hormones -topiramate -warfarin This list may not describe all possible interactions. Give your health care  provider a list of all the medicines, herbs, non-prescription drugs, or dietary supplements you use. Also tell them if you smoke, drink alcohol, or use illegal drugs. Some   items may interact with your medicine. What should I watch for while using this medicine? Visit your doctor or health care professional for regular checks on your progress. You will need a regular breast and pelvic exam and Pap smear while on this medicine. Use an additional method of contraception during the first cycle that you take these tablets. If you have any reason to think you are pregnant, stop taking this medicine right away and contact your doctor or health care professional. If you are taking this medicine for hormone related problems, it may take several cycles of use to see improvement in your condition. Smoking increases the risk of getting a blood clot or having a stroke while you are taking birth control pills, especially if you are more than 26 years old. You are strongly advised not to smoke. This medicine can make your body retain fluid, making your fingers, hands, or ankles swell. Your blood pressure can go up. Contact your doctor or health care professional if you feel you are retaining fluid. This medicine can make you more sensitive to the sun. Keep out of the sun. If you cannot avoid being in the sun, wear protective clothing and use sunscreen. Do not use sun lamps or tanning beds/booths. If you wear contact lenses and notice visual changes, or if the lenses begin to feel uncomfortable, consult your eye care specialist. In some women, tenderness, swelling, or minor bleeding of the gums may occur. Notify your dentist if this happens. Brushing and flossing your teeth regularly may help limit this. See your dentist regularly and inform your dentist of the medicines you are taking. If you are going to have elective surgery, you may need to stop taking this medicine before the surgery. Consult your health care  professional for advice. This medicine does not protect you against HIV infection (AIDS) or any other sexually transmitted diseases. What side effects may I notice from receiving this medicine? Side effects that you should report to your doctor or health care professional as soon as possible: -allergic reactions like skin rash, itching or hives, swelling of the face, lips, or tongue -breast tissue changes or discharge -changes in vaginal bleeding during your period or between your periods -changes in vision -chest pain -confusion -coughing up blood -dizziness -feeling faint or lightheaded -headaches or migraines -leg, arm or groin pain -loss of balance or coordination -severe or sudden headaches -stomach pain (severe) -sudden shortness of breath -sudden numbness or weakness of the face, arm or leg -symptoms of vaginal infection like itching, irritation or unusual discharge -tenderness in the upper abdomen -trouble speaking or understanding -vomiting -yellowing of the eyes or skin Side effects that usually do not require medical attention (report to your doctor or health care professional if they continue or are bothersome): -breakthrough bleeding and spotting that continues beyond the 3 initial cycles of pills -breast tenderness -mood changes, anxiety, depression, frustration, anger, or emotional outbursts -increased sensitivity to sun or ultraviolet light -nausea -skin rash, acne, or brown spots on the skin -weight gain (slight) This list may not describe all possible side effects. Call your doctor for medical advice about side effects. You may report side effects to FDA at 1-800-FDA-1088. Where should I keep my medicine? Keep out of the reach of children. Store at room temperature between 15 and 30 degrees C (59 and 86 degrees F). Throw away any unused medicine after the expiration date. NOTE: This sheet is a summary. It may not cover all possible information. If you   have  questions about this medicine, talk to your doctor, pharmacist, or health care provider.  2018 Elsevier/Gold Standard (2016-06-03 08:04:41) Preventive Care 18-39 Years, Female Preventive care refers to lifestyle choices and visits with your health care provider that can promote health and wellness. What does preventive care include?  A yearly physical exam. This is also called an annual well check.  Dental exams once or twice a year.  Routine eye exams. Ask your health care provider how often you should have your eyes checked.  Personal lifestyle choices, including: ? Daily care of your teeth and gums. ? Regular physical activity. ? Eating a healthy diet. ? Avoiding tobacco and drug use. ? Limiting alcohol use. ? Practicing safe sex. ? Taking vitamin and mineral supplements as recommended by your health care provider. What happens during an annual well check? The services and screenings done by your health care provider during your annual well check will depend on your age, overall health, lifestyle risk factors, and family history of disease. Counseling Your health care provider may ask you questions about your:  Alcohol use.  Tobacco use.  Drug use.  Emotional well-being.  Home and relationship well-being.  Sexual activity.  Eating habits.  Work and work environment.  Method of birth control.  Menstrual cycle.  Pregnancy history.  Screening You may have the following tests or measurements:  Height, weight, and BMI.  Diabetes screening. This is done by checking your blood sugar (glucose) after you have not eaten for a while (fasting).  Blood pressure.  Lipid and cholesterol levels. These may be checked every 5 years starting at age 20.  Skin check.  Hepatitis C blood test.  Hepatitis B blood test.  Sexually transmitted disease (STD) testing.  BRCA-related cancer screening. This may be done if you have a family history of breast, ovarian, tubal, or  peritoneal cancers.  Pelvic exam and Pap test. This may be done every 3 years starting at age 21. Starting at age 30, this may be done every 5 years if you have a Pap test in combination with an HPV test.  Discuss your test results, treatment options, and if necessary, the need for more tests with your health care provider. Vaccines Your health care provider may recommend certain vaccines, such as:  Influenza vaccine. This is recommended every year.  Tetanus, diphtheria, and acellular pertussis (Tdap, Td) vaccine. You may need a Td booster every 10 years.  Varicella vaccine. You may need this if you have not been vaccinated.  HPV vaccine. If you are 26 or younger, you may need three doses over 6 months.  Measles, mumps, and rubella (MMR) vaccine. You may need at least one dose of MMR. You may also need a second dose.  Pneumococcal 13-valent conjugate (PCV13) vaccine. You may need this if you have certain conditions and were not previously vaccinated.  Pneumococcal polysaccharide (PPSV23) vaccine. You may need one or two doses if you smoke cigarettes or if you have certain conditions.  Meningococcal vaccine. One dose is recommended if you are age 19-21 years and a first-year college student living in a residence hall, or if you have one of several medical conditions. You may also need additional booster doses.  Hepatitis A vaccine. You may need this if you have certain conditions or if you travel or work in places where you may be exposed to hepatitis A.  Hepatitis B vaccine. You may need this if you have certain conditions or if you travel or work in   places where you may be exposed to hepatitis B.  Haemophilus influenzae type b (Hib) vaccine. You may need this if you have certain risk factors.  Talk to your health care provider about which screenings and vaccines you need and how often you need them. This information is not intended to replace advice given to you by your health care  provider. Make sure you discuss any questions you have with your health care provider. Document Released: 11/19/2001 Document Revised: 06/12/2016 Document Reviewed: 07/25/2015 Elsevier Interactive Patient Education  2018 Elsevier Inc.  

## 2018-01-30 NOTE — Progress Notes (Signed)
Subjective:    Tara Baldwin is a 27 y.o. [id number] African American female who presents for a postpartum visit. She is 6 weeks postpartum following a primary cesarean section, low transverse incision at 41+2 gestational weeks. Anesthesia: epidural. I have fully reviewed the prenatal and intrapartum course.   Postpartum course has been uncomplicated. Baby's course has been uncomplicated. Baby is feeding by bottle. Bleeding no bleeding. Bowel function is normal. Bladder function is normal.   Patient is not sexually active. Contraception method is abstinence. Postpartum depression screening: negative. Score 1.  Last pap 2017 and was normal, history of abnormal and colposcopy in 2015.  Denies difficulty breathing or respiratory distress, chest pain, abdominal pain, excessive vaginal bleeding, dysuria, and leg pain or swelling.  The following portions of the patient's history were reviewed and updated as appropriate: allergies, current medications, past medical history, past surgical history and problem list.  Review of Systems  Pertinent items are noted in HPI.   Objective:   BP 125/62   Pulse 67   Ht 5\' 7"  (1.702 m)   Wt 174 lb 8 oz (79.2 kg)   Breastfeeding? No   BMI 27.33 kg/m   General:  alert, cooperative and no distress   Breasts:  deferred, no complaints  Lungs: clear to auscultation bilaterally  Heart:  regular rate and rhythm  Abdomen: soft, nontender   Vulva: normal       Office Visit from 01/30/2018 in [facility]  PHQ-9 Total Score  1       Assessment:   Postpartum exam Six (6) wks s/p primary c-section Formulafeeding Depression screening Contraception counseling   Plan:   Rx: Junel, see orders.   May resume exercise. May return to work without restriction.   Reviewed red flag symptoms and when to call.   Follow up in: 7 months for Annual Exam or earlier if needed.   Gunnar BullaJenkins Michelle Benjamyn Hestand, CNM [facility], [facility]

## 2018-01-30 NOTE — Progress Notes (Signed)
Pt is doing well no concerns.  

## 2018-02-11 ENCOUNTER — Telehealth: Payer: Self-pay

## 2018-02-11 ENCOUNTER — Encounter: Payer: Self-pay | Admitting: Certified Nurse Midwife

## 2018-04-20 ENCOUNTER — Encounter: Payer: Self-pay | Admitting: Certified Nurse Midwife

## 2018-04-21 ENCOUNTER — Other Ambulatory Visit: Payer: Self-pay | Admitting: Certified Nurse Midwife

## 2018-04-21 DIAGNOSIS — M6208 Separation of muscle (nontraumatic), other site: Secondary | ICD-10-CM

## 2018-05-01 ENCOUNTER — Ambulatory Visit: Payer: Managed Care, Other (non HMO) | Attending: Certified Nurse Midwife

## 2018-05-01 ENCOUNTER — Other Ambulatory Visit: Payer: Self-pay

## 2018-05-01 DIAGNOSIS — M6281 Muscle weakness (generalized): Secondary | ICD-10-CM | POA: Insufficient documentation

## 2018-05-01 DIAGNOSIS — M629 Disorder of muscle, unspecified: Secondary | ICD-10-CM | POA: Diagnosis present

## 2018-05-01 DIAGNOSIS — M6208 Separation of muscle (nontraumatic), other site: Secondary | ICD-10-CM | POA: Insufficient documentation

## 2018-05-01 DIAGNOSIS — M6289 Other specified disorders of muscle: Secondary | ICD-10-CM

## 2018-05-01 NOTE — Patient Instructions (Addendum)
[email or web address] Ab Wrap for diastasis support.   Getting In/out of bed     Lying on back, bend left knee and place left arm across chest. Roll all in one movement to the right. Reverse to roll to the left. Always move as one unit.     Once you are lying on you side, move legs to edge of bed. Pull in the pelvic floor and lower tummy and push down with both hands while moving legs off bed to reach sitting position.  *Reverse sequence to return to lying down.  Copyright  VHI. All rights reserved.     Copyright  VHI. All rights reserved.     Access Code: [email or web address]  URL: [email or web address]  Date: 05/01/2018  Prepared by: Flora LippsKeeli Georgina Krist   Exercises  Seated Flexion Stretch - 3 reps - 5 Breaths hold - 1x daily - 7x weekly  Half Kneeling Hip Flexor Stretch - 3 reps - 5 Breaths hold - 1x daily - 7x weekly  TL Sidebending Stretch - Single Arm Overhead - 3 reps - 5 Breaths hold - 1x daily - 7x weekly  Hooklying Transversus Abdominis Palpation - 10 reps - 3 sets - 1x daily - 7x weekly  Supine March with Posterior Pelvic Tilt - 10 reps - 3 sets - 1x daily - 7x weekly

## 2018-05-01 NOTE — Therapy (Signed)
[facility] MAIN [facility] SERVICES [address] [place], [place], [address] Phone: [phone]   Fax:  [phone]  Physical Therapy Evaluation  Patient Details  Name: [place] Cohick MRN: [id number] Date of Birth: Jul 13, 1991 Referring Provider: Gunnar Bulla   Encounter Date: 05/01/2018  PT End of Session - 05/04/18 0939    Visit Number  1    Number of Visits  12    Date for PT Re-Evaluation  07/27/18    Authorization Type  [employer]    Authorization - Visit Number  1    Authorization - Number of Visits  12    PT Start Time  0800    PT Stop Time  0900    PT Time Calculation (min)  60 min    Activity Tolerance  Patient tolerated treatment well    Behavior During Therapy  [facility] for tasks assessed/performed       Past Medical History:  Diagnosis Date  . Headache   . STD (female) 05/16/2017   chlmydia    Past Surgical History:  Procedure Laterality Date  . CESAREAN SECTION N/A 12/19/2017   Procedure: CESAREAN SECTION;  Surgeon: Herold Harms, MD;  Location: [facility] ORS;  Service: Obstetrics;  Laterality: N/A;  . NO PAST SURGERIES      There were no vitals filed for this visit.     Pelvic Floor Physical Therapy Evaluation and Assessment  SCREENING  Falls in last 6 mo: no    Patient's communication preference:   Red Flags:  Have you had any night sweats? no Unexplained weight loss? no Saddle anesthesia? no Unexplained changes in bowel or bladder habits? No  SUBJECTIVE  Patient reports: She has noticed doming in her abdomen following delivery of her son.   Precautions:  Diastasis Recti  Social/Family/Vocational History:   4 months postpartum  Recent Procedures/Tests/Findings:  none  Obstetrical History: G1P1 C-section  Gynecological History: Had chlamidia once, resolved  Urinary History: No leakage  Gastrointestinal History: Normal  Sexual activity/pain: Sometimes has pain with deep  penetration.  Location of pain: no pain  Patient Goals: Improve diastasis. Be able to get back to dancing without causing pain or damage.    OBJECTIVE  Posture/Observations:  Sitting: Doming visible without supportive binder. Standing: L knee slightly bent, r knee hyperextends  Palpation/Segmental Motion/Joint Play: Decreased mobility and fascial restriction through LB without tenderness.  Special tests:   SLR: Positive for instability  Range of Motion/Flexibilty:  Spine: decreased SB flexibility. Hips: -3 deg. B for hip EXT.   Strength/MMT: Deferred to future visit LE MMT  LE MMT Left Right  Hip flex:  (L2) /5 /5  Hip ext: /5 /5  Hip abd: /5 /5  Hip add: /5 /5  Hip IR /5 /5  Hip ER /5 /5     Abdominal:  Palpation: TTP through lower half of abdomen esp at Psoas B. Diastasis:4 fingers at rest, paradoxical contraction with cough/sneeze etc.  Pelvic Floor External Exam: Deferred Indefinitely  Introitus Appears:  Skin integrity:  Palpation: Cough: Prolapse visible?: Scar mobility:  Internal Vaginal Exam: Deferred Indefinitely  Strength (PERF):  Symmetry: Palpation: Prolapse:    Gait Analysis: not assessed.   Pelvic Floor Outcome Measures: PFDI and PFIQ: 0/300  INTERVENTIONS THIS SESSION: Self-care: Educated on the structure and function of the pelvic floor in relation to their symptoms as well as the POC, and initial HEP in order to set patient expectations and understanding from which we will build on  in the future sessions. Educated on soda-can theory and log-roll to decrease stress on anterior abdominal wall.  Therex: Educated on and practiced hip flexor, side, and low back stretch to decrease muscular imbalance and TA contractions in supine with mini-marches to increase activation of TA with functional motion for improved support.  Total time: 60 min.    [facility] PT Assessment - 05/04/18 0001      Assessment   Medical Diagnosis  Diastasis recti     Referring Provider  Gunnar Bulla    Onset Date/Surgical Date  12/19/17    Prior Therapy  none      Precautions   Precautions  None      Restrictions   Weight Bearing Restrictions  No      Balance Screen   Has the patient fallen in the past 6 months  No                Objective measurements completed on examination: See above findings.              PT Education - 05/04/18 1610    Education Details  See Pt. Instructions and INterventions this session    Person(s) Educated  Patient    Methods  Explanation;Demonstration;Tactile cues;Verbal cues;Handout    Comprehension  Verbalized understanding;Returned demonstration;Verbal cues required;Tactile cues required       PT Short Term Goals - 05/04/18 0957      PT SHORT TERM GOAL #1   Title  Patient will demonstrate functional recruitment of TA with breathing, sit-to-stand, squatting/lifting, and walking to allow for improved pelvic brace coordination, improved balance, and decreased intraabdominal pressure to allow for healing of diastasis recti.    Baseline  paradoxical contraction with laugh, cough, etc.    Time  6    Period  Weeks    Status  New    Target Date  06/12/18      PT SHORT TERM GOAL #2   Title  Patient will demonstrate HEP x1 in the clinic to demonstrate understanding and proper form to allow for further improvement.    Time  6    Period  Weeks    Status  New    Target Date  06/12/18      PT SHORT TERM GOAL #3   Title  Pt. will demonstrate ability to actively maintain foce generation across diastasis with SLR to demonstrate readiness to increase exercise intensity for long-term goal attainment.    Time  6    Period  Weeks    Status  New    Target Date  06/12/18        PT Long Term Goals - 05/04/18 1002      PT LONG TERM GOAL #1   Title  Pt. will demonstrate 2 finger or less separation of diastasis recti without activation, which narrows with active contraction to less than 1  finger to demonstrate functional force transmission to protect abdomen and spine during high-level activity.    Baseline  4 fingers at rest    Time  12    Period  Weeks    Status  New    Target Date  07/27/18      PT LONG TERM GOAL #2   Title  Pt. Will demonstrate ability to return to dance for 30 min. with only light support from tape without demonstrating doming of abdomen.    Baseline  Doming with standing still    Time  12    Period  Weeks    Status  New    Target Date  07/27/18             Plan - 05/04/18 0939    Clinical Impression Statement  Pt. is a 27 y/o female who presents today with cheif c/o decreased abdominal integrity and doming due to 4 finger diastasis recti following c-section deliver of her 804 month-old son. 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 Without proper instruction and training she is at high risk for developing pain and incontinence due to significant rectus abdominus. Clinical exam reveals Paradoxical contraction of deep-core and superficial abdominal muscles, decreased hip flexor extension ROM, tightness in side-bending ROM and decreased lumbar flexion ROM whaich are all contributing to Pt's decreased stability. She will benefit from skilled pelvic PT to address these muscular imablances and restore core stability to allow Pt. to safely return to sport without causing pain, incontinence, or further dysfunction.    Clinical Presentation  Evolving    Clinical Decision Making  Moderate    Rehab Potential  Excellent    Clinical Impairments Affecting Rehab Potential  Breast feeding, 4 months postpartum    PT Frequency  1x / week    PT Duration  12 weeks    PT Treatment/Interventions  ADLs/Self Care Home Management;Biofeedback;Electrical Stimulation;Functional mobility training;Therapeutic activities;Therapeutic exercise;Neuromuscular  re-education;Patient/family education;Passive range of motion;Scar mobilization;Manual techniques;Dry needling;Taping    PT Next Visit Plan  Assess scar more fully and teach mobility. MFR to low back and prone TA/Bird-dog.     PT Home Exercise Plan  [id number] , soda-can, log-roll.     Consulted and Agree with Plan of Care  Patient       Patient will benefit from skilled therapeutic intervention in order to improve the following deficits and impairments:  Increased fascial restricitons, Pain, Improper body mechanics, Decreased coordination, Decreased scar mobility, Increased muscle spasms, Decreased range of motion, Decreased strength, Hypomobility, Decreased balance, Impaired flexibility  Visit Diagnosis: Muscle weakness (generalized)  Diastasis recti  Muscular imbalance     Problem List Patient Active Problem List   Diagnosis Date Noted  . History of C-section 12/30/2017  . Left flank pain 12/14/2017  . Breast lump on right side at 11 o'clock position 10/09/2017   Cleophus MoltKeeli T. Gailes DPT, ATC Cleophus MoltKeeli T Gailes 05/04/2018, 10:20 AM  [place] [facility] MAIN [facility] [address] [place], [place], [address] Phone: [phone]   Fax:  [phone]  Name: Placido SouBreanna Bencivenga MRN: [id number] Date of Birth: 19-Feb-1991

## 2018-05-07 ENCOUNTER — Ambulatory Visit: Payer: Managed Care, Other (non HMO) | Attending: Certified Nurse Midwife

## 2018-05-07 DIAGNOSIS — M629 Disorder of muscle, unspecified: Secondary | ICD-10-CM | POA: Insufficient documentation

## 2018-05-07 DIAGNOSIS — M6289 Other specified disorders of muscle: Secondary | ICD-10-CM

## 2018-05-07 DIAGNOSIS — M6208 Separation of muscle (nontraumatic), other site: Secondary | ICD-10-CM | POA: Diagnosis present

## 2018-05-07 DIAGNOSIS — M6281 Muscle weakness (generalized): Secondary | ICD-10-CM | POA: Insufficient documentation

## 2018-05-07 NOTE — Therapy (Signed)
[facility] [facility] [address] [place], [place], [address] Phone: [phone]   Fax:  [phone]  Physical Therapy Treatment  Patient Details  Name: Tara SouBreanna Baldwin MRN: [id number] Date of Birth: [birth year] Referring Provider: Gunnar BullaLawhorn, Jenkins Michelle   Encounter Date: 05/07/2018    Past Medical History:  Diagnosis Date  . Headache   . STD (female) 05/16/2017   chlmydia    Past Surgical History:  Procedure Laterality Date  . CESAREAN SECTION N/A 12/19/2017   Procedure: CESAREAN SECTION;  Surgeon: Herold Harmsefrancesco, Martin A, MD;  Location: [facility] ORS;  Service: Obstetrics;  Laterality: N/A;  . NO PAST SURGERIES      There were no vitals filed for this visit.    Pelvic Floor Physical Therapy Treatment Note  SCREENING  Changes in medications, allergies, or medical history?: no    SUBJECTIVE  Patient reports: She really likes the stretches but the exercises are harder than she thought they would be, they require a lot of focus.  Precautions:  breastfeeding  Pain update: No pain  Patient Goals: Improve diastasis. Be able to get back to dancing without causing pain or damage.    OBJECTIVE  Changes in: Posture/Observations:  Continues to have mild anterior pelvic tilt and doming in standing pre-treatment. Improved following session but will require follow up.  Palpation: TTP through R>L QL and paraspinals and decreased cephalic ROM of fascia along the R>L c-section scar.  INTERVENTIONS THIS SESSION: Manual: performed STM and TP release to B QL and lumbar paraspinals as well as MFR at the c-section scar and through her R obliques to decrease spasm and improve fascial mobility for improved deep core recruitment and approximation of the diastasis recti. Therex: educated on and practiced TA/modified cat-cow in quadruped to strengthen TA and lengthen lumbar extensors to encourage diastasis approximation. Theract:  discussed baby bjorn wearing and how to decrease stress on low back with posterior pelvic tilt, discussed and practiced how to carry the car-seat to minimize stress on the diastasis and applied k-tape to improve proprioception of TA and encourage approximation of the diastasis.  Total time: 60 min.                         PT Education - 05/07/18 1407    Education Details  See Pt. Instructions and Interventions     Person(s) Educated  Patient    Methods  Explanation;Demonstration;Tactile cues;Verbal cues;Handout    Comprehension  Verbalized understanding;Returned demonstration;Verbal cues required;Tactile cues required       PT Short Term Goals - 05/04/18 0957      PT SHORT TERM GOAL #1   Title  Patient will demonstrate functional recruitment of TA with breathing, sit-to-stand, squatting/lifting, and walking to allow for improved pelvic brace coordination, improved balance, and decreased intraabdominal pressure to allow for healing of diastasis recti.    Baseline  paradoxical contraction with laugh, cough, etc.    Time  6    Period  Weeks    Status  New    Target Date  06/12/18      PT SHORT TERM GOAL #2   Title  Patient will demonstrate HEP x1 in the clinic to demonstrate understanding and proper form to allow for further improvement.    Time  6    Period  Weeks    Status  New    Target Date  06/12/18      PT SHORT TERM GOAL #3  Title  Pt. will demonstrate ability to actively maintain foce generation across diastasis with SLR to demonstrate readiness to increase exercise intensity for long-term goal attainment.    Time  6    Period  Weeks    Status  New    Target Date  06/12/18        PT Long Term Goals - 05/04/18 1002      PT LONG TERM GOAL #1   Title  Pt. will demonstrate 2 finger or less separation of diastasis recti without activation, which narrows with active contraction to less than 1 finger to demonstrate functional force transmission to  protect abdomen and spine during high-level activity.    Baseline  4 fingers at rest    Time  12    Period  Weeks    Status  New    Target Date  07/27/18      PT LONG TERM GOAL #2   Title  Pt. Will demonstrate ability to return to dance for 30 min. with only light support from tape without demonstrating doming of abdomen.    Baseline  Doming with standing still    Time  12    Period  Weeks    Status  New    Target Date  07/27/18            Plan - 05/07/18 1409    Clinical Impression Statement  Pt. responded well but slowly to all treatment today, demonstrating understanding of all education provided and decreased spasms following treatment. She will require further release to R lumbar/lateral abdomen due to slow response and disruption of treatment by fussy baby. Continue per POC.    Clinical Presentation  Evolving    Clinical Decision Making  Moderate    Rehab Potential  Excellent    Clinical Impairments Affecting Rehab Potential  Breast feeding, 4 months postpartum    PT Frequency  1x / week    PT Duration  12 weeks    PT Treatment/Interventions  ADLs/Self Care Home Management;Biofeedback;Electrical Stimulation;Functional mobility training;Therapeutic activities;Therapeutic exercise;Neuromuscular re-education;Patient/family education;Passive range of motion;Scar mobilization;Manual techniques;Dry needling;Taping    PT Next Visit Plan  review HEP, re-tape, DN vs TP release to L side/back and functional movement training with handout.    PT Home Exercise Plan  [id number] , soda-can, log-roll, TA in [employer] and Agree with Plan of Care  Patient       Patient will benefit from skilled therapeutic intervention in order to improve the following deficits and impairments:  Increased fascial restricitons, Pain, Improper body mechanics, Decreased coordination, Decreased scar mobility, Increased muscle spasms, Decreased range of motion, Decreased strength, Hypomobility, Decreased  balance, Impaired flexibility  Visit Diagnosis: Muscle weakness (generalized)  Diastasis recti  Muscular imbalance     Problem List Patient Active Problem List   Diagnosis Date Noted  . History of C-section 12/30/2017  . Left flank pain 12/14/2017  . Breast lump on right side at 11 o'clock position 10/09/2017   Cleophus Molt DPT, ATC Cleophus Molt 05/07/2018, 2:16 PM  [facility] MAIN [facility] SERVICES [address] [place], [place], [address] Phone: [phone]   Fax:  [phone]  Name: Tara Baldwin MRN: [id number] Date of Birth: [birth year]

## 2018-05-07 NOTE — Patient Instructions (Signed)
   Breathe in, let belly relax down toward the floor and then breathe out, pulling the lower belly in toward the backbone. FOCUS MORE ON EXHALE/ROUNDING UP   Repeat this _3x10__ times ___ times per day

## 2018-05-14 ENCOUNTER — Ambulatory Visit: Payer: Managed Care, Other (non HMO)

## 2018-05-14 DIAGNOSIS — M6281 Muscle weakness (generalized): Secondary | ICD-10-CM | POA: Diagnosis not present

## 2018-05-14 DIAGNOSIS — M6289 Other specified disorders of muscle: Secondary | ICD-10-CM

## 2018-05-14 DIAGNOSIS — M629 Disorder of muscle, unspecified: Secondary | ICD-10-CM

## 2018-05-14 DIAGNOSIS — M6208 Separation of muscle (nontraumatic), other site: Secondary | ICD-10-CM

## 2018-05-14 NOTE — Therapy (Signed)
[place] [facility] MAIN [facility] SERVICES [address] [place], [place], [address] Phone: [phone]   Fax:  [phone]  Physical Therapy Treatment  Patient Details  Name: Tara Baldwin MRN: [id number] Date of Birth: 1990/12/18 Referring Provider: Gunnar Bulla   Encounter Date: 05/14/2018  PT End of Session - 05/17/18 2039    Visit Number  3    Number of Visits  12    Date for PT Re-Evaluation  07/27/18    Authorization Type  [employer]    Authorization - Visit Number  2    Authorization - Number of Visits  12    PT Start Time  1100    PT Stop Time  1200    PT Time Calculation (min)  60 min    Activity Tolerance  Patient tolerated treatment well    Behavior During Therapy  [facility] for tasks assessed/performed       Past Medical History:  Diagnosis Date  . Headache   . STD (female) 05/16/2017   chlmydia    Past Surgical History:  Procedure Laterality Date  . CESAREAN SECTION N/A 12/19/2017   Procedure: CESAREAN SECTION;  Surgeon: Herold Harms, MD;  Location: [facility] ORS;  Service: Obstetrics;  Laterality: N/A;  . NO PAST SURGERIES      There were no vitals filed for this visit.   Pelvic Floor Physical Therapy Treatment Note  SCREENING  Changes in medications, allergies, or medical history?: no    SUBJECTIVE  Patient reports: She has continued to have some soreness and tightness in her R hip/low back but has been very diligent with her HEP and finds it really hard to engage her TA.   Precautions:  Breastfeeding  Patient Goals: Improve diastasis. Be able to get back to dancing without causing pain or damage.   OBJECTIVE  Changes in: Posture/Observations:  Anterior pelvic tilt  Palpation: TTP to B lumbar paraspinals, R QL, and R obliques   INTERVENTIONS THIS SESSION: Manual: Performed MFR, STM, and TP release to B lumbar paraspinals, R QL, and R obliques as well as PA grad 2-4 mobs to Sacrum B and ~L2-4  spinous processes for improved mobility and decreased tension on nerve rots to allow for improved deep core recruitment. Dry needling Performed standard approach TPDN to R lumbar paraspinals to decrease pain and spasm and improve deep core stabilizer recruitment. Therex: educated on and practiced pelvic tilts in sitting and standing and reviewed mini-marches in supine to incorporate newly improved ability to recruit for improved strength and stability of deep core.  Total time: 68 min.                          PT Education - 05/17/18 2038    Education Details  Educated on risks and benefits of dry needling and what to expect following treatment.    Person(s) Educated  Patient    Methods  Explanation    Comprehension  Verbalized understanding       PT Short Term Goals - 05/04/18 0957      PT SHORT TERM GOAL #1   Title  Patient will demonstrate functional recruitment of TA with breathing, sit-to-stand, squatting/lifting, and walking to allow for improved pelvic brace coordination, improved balance, and decreased intraabdominal pressure to allow for healing of diastasis recti.    Baseline  paradoxical contraction with laugh, cough, etc.    Time  6    Period  Weeks  Status  New    Target Date  06/12/18      PT SHORT TERM GOAL #2   Title  Patient will demonstrate HEP x1 in the clinic to demonstrate understanding and proper form to allow for further improvement.    Time  6    Period  Weeks    Status  New    Target Date  06/12/18      PT SHORT TERM GOAL #3   Title  Pt. will demonstrate ability to actively maintain foce generation across diastasis with SLR to demonstrate readiness to increase exercise intensity for long-term goal attainment.    Time  6    Period  Weeks    Status  New    Target Date  06/12/18        PT Long Term Goals - 05/04/18 1002      PT LONG TERM GOAL #1   Title  Pt. will demonstrate 2 finger or less separation of diastasis recti  without activation, which narrows with active contraction to less than 1 finger to demonstrate functional force transmission to protect abdomen and spine during high-level activity.    Baseline  4 fingers at rest    Time  12    Period  Weeks    Status  New    Target Date  07/27/18      PT LONG TERM GOAL #2   Title  Pt. Will demonstrate ability to return to dance for 30 min. with only light support from tape without demonstrating doming of abdomen.    Baseline  Doming with standing still    Time  12    Period  Weeks    Status  New    Target Date  07/27/18            Plan - 05/17/18 2041    Clinical Impression Statement  Pt. responded well though slowly to manual treatment and needlng today. Pt. attempted Dry needling but decided that she does not want to use it again due to discomfort. She demonstrated significantly improved ability to recruit TA for exercises and use improved posture following today's treatment.    Clinical Presentation  Evolving    Rehab Potential  Excellent    Clinical Impairments Affecting Rehab Potential  Breast feeding, 4 months postpartum    PT Frequency  1x / week    PT Duration  12 weeks    PT Treatment/Interventions  ADLs/Self Care Home Management;Biofeedback;Electrical Stimulation;Functional mobility training;Therapeutic activities;Therapeutic exercise;Neuromuscular re-education;Patient/family education;Passive range of motion;Scar mobilization;Manual techniques;Dry needling;Taping    PT Next Visit Plan  review HEP, re-tape, assess running mechanics, functional movement training with handout.    PT Home Exercise Plan  [id number] , soda-can, log-roll, TA in [employer]    Consulted and Agree with Plan of Care  Patient       Patient will benefit from skilled therapeutic intervention in order to improve the following deficits and impairments:  Increased fascial restricitons, Pain, Improper body mechanics, Decreased coordination, Decreased scar mobility, Increased  muscle spasms, Decreased range of motion, Decreased strength, Hypomobility, Decreased balance, Impaired flexibility  Visit Diagnosis: Muscle weakness (generalized)  Diastasis recti  Muscular imbalance     Problem List Patient Active Problem List   Diagnosis Date Noted  . History of C-section 12/30/2017  . Left flank pain 12/14/2017  . Breast lump on right side at 11 o'clock position 10/09/2017   Cleophus MoltKeeli T. Tzion Wedel DPT, ATC Cleophus MoltKeeli T Jackolyn Geron 05/17/2018, 8:45 PM  [place] [facility] MAIN [facility] SERVICES [address] [place], [place], [address] Phone: [phone]   Fax:  [phone]  Name: Tara Baldwin MRN: [id number] Date of Birth: Feb 01, 1991

## 2018-05-21 ENCOUNTER — Ambulatory Visit: Payer: Managed Care, Other (non HMO)

## 2018-05-21 DIAGNOSIS — M6208 Separation of muscle (nontraumatic), other site: Secondary | ICD-10-CM

## 2018-05-21 DIAGNOSIS — M6289 Other specified disorders of muscle: Secondary | ICD-10-CM

## 2018-05-21 DIAGNOSIS — M629 Disorder of muscle, unspecified: Secondary | ICD-10-CM

## 2018-05-21 DIAGNOSIS — M6281 Muscle weakness (generalized): Secondary | ICD-10-CM

## 2018-05-21 NOTE — Therapy (Signed)
[place] [facility] [facility] [address] [place], [place], [address] Phone: [phone]   Fax:  [phone]  Physical Therapy Treatment  Patient Details  Name: Placido SouBreanna Rudnick MRN: [id number] Date of Birth: April 05, 1991 Referring Provider: Gunnar BullaLawhorn, Jenkins Michelle   Encounter Date: 05/21/2018  PT End of Session - 05/22/18 2019    Visit Number  4    Number of Visits  12    Date for PT Re-Evaluation  07/27/18    Authorization - Visit Number  4    Authorization - Number of Visits  12    PT Start Time  1100    PT Stop Time  1200    PT Time Calculation (min)  60 min    Activity Tolerance  Patient tolerated treatment well    Behavior During Therapy  [facility]/performed       Past Medical History:  Diagnosis Date  . Headache   . STD (female) 05/16/2017   chlmydia    Past Surgical History:  Procedure Laterality Date  . CESAREAN SECTION N/A 12/19/2017   Procedure: CESAREAN SECTION;  Surgeon: Herold Harmsefrancesco, Martin A, MD;  Location: [facility] ORS;  Service: Obstetrics;  Laterality: N/A;  . NO PAST SURGERIES      There were no vitals filed for this visit.    Pelvic Floor Physical Therapy Treatment Note  SCREENING  Changes in medications, allergies, or medical history?: no   SUBJECTIVE  Patient reports: Tried doing zumba yesterday and it caused a sharp pain into her L low back at the SIJ. Thinks she wants to re-try needling today.   Precautions:  breastfeeding  Patient Goals: Improve diastasis. Be able to get back to dancing without causing pain or damage.   OBJECTIVE  Changes in:  Range of Motion/Flexibilty:  Decreased and painful mobility through upper lumbar vertebrae.  Palpation: Decreased fascial mobility along lumbar spine, TTP to B Paraspinals (L>R).  Gait Analysis: Anterior pelvic tilt, near heel-strike with long stride and "bobbing"  INTERVENTIONS THIS SESSION: Manual: Performed STM, TP release,  grade 3-4 PA mobs, and MFR to lumbar region to decrease spasms and improve fascial mobility to decrease pressure on nerve roots exiting the spine as well as to allow for improved fascial mobility so the Diastasis recti can re-approximate anteriorly to improve stability of the spine and decrease discomfort. Dry-needle: performed TPDN to L lumbar paraspinals with a .30x4350mm needle to decraese spasm and pain and improve nerual flow and recruitment of deep  Core musculature. There-act: educated on and practiced running mechanics to decrease loading on spine and improve deep-core recruitment to allow Pt. To return to prior level of activity without worsening Sx. Re-applied kinesiotape to continue to support diastasis and improve proprioception to decrease separation.  Total time: 60 min.                          PT Education - 05/22/18 2019    Education Details  See Interventions this session    Person(s) Educated  Patient    Methods  Explanation;Demonstration;Verbal cues    Comprehension  Verbalized understanding;Returned demonstration;Verbal cues required       PT Short Term Goals - 05/04/18 0957      PT SHORT TERM GOAL #1   Title  Patient will demonstrate functional recruitment of TA with breathing, sit-to-stand, squatting/lifting, and walking to allow for improved pelvic brace coordination, improved balance, and decreased intraabdominal pressure to allow for healing  of diastasis recti.    Baseline  paradoxical contraction with laugh, cough, etc.    Time  6    Period  Weeks    Status  New    Target Date  06/12/18      PT SHORT TERM GOAL #2   Title  Patient will demonstrate HEP x1 in the clinic to demonstrate understanding and proper form to allow for further improvement.    Time  6    Period  Weeks    Status  New    Target Date  06/12/18      PT SHORT TERM GOAL #3   Title  Pt. will demonstrate ability to actively maintain foce generation across diastasis with SLR  to demonstrate readiness to increase exercise intensity for long-term goal attainment.    Time  6    Period  Weeks    Status  New    Target Date  06/12/18        PT Long Term Goals - 05/04/18 1002      PT LONG TERM GOAL #1   Title  Pt. will demonstrate 2 finger or less separation of diastasis recti without activation, which narrows with active contraction to less than 1 finger to demonstrate functional force transmission to protect abdomen and spine during high-level activity.    Baseline  4 fingers at rest    Time  12    Period  Weeks    Status  New    Target Date  07/27/18      PT LONG TERM GOAL #2   Title  Pt. Will demonstrate ability to return to dance for 30 min. with only light support from tape without demonstrating doming of abdomen.    Baseline  Doming with standing still    Time  12    Period  Weeks    Status  New    Target Date  07/27/18            Plan - 05/22/18 2019    Clinical Impression Statement  Pt. responded well to all intervention today with decreased spasm, improved fascial and intervertebral mobility, and she demonstrated understanding of education on running mechanics. Continue per POC.     Clinical Presentation  Stable    Clinical Decision Making  Moderate    Clinical Impairments Affecting Rehab Potential  Breast feeding, 4 months postpartum    PT Frequency  1x / week    PT Duration  12 weeks    PT Treatment/Interventions  ADLs/Self Care Home Management;Biofeedback;Electrical Stimulation;Functional mobility training;Therapeutic activities;Therapeutic exercise;Neuromuscular re-education;Patient/family education;Passive range of motion;Scar mobilization;Manual techniques;Dry needling;Taping    PT Next Visit Plan  re-tape, core strengthening    PT Home Exercise Plan  [email or web address] , soda-can, log-roll, TA in [employer]    Consulted and Agree with Plan of Care  Patient       Patient will benefit from skilled therapeutic intervention in order to improve the  following deficits and impairments:  Increased fascial restricitons, Pain, Improper body mechanics, Decreased coordination, Decreased scar mobility, Increased muscle spasms, Decreased range of motion, Decreased strength, Hypomobility, Decreased balance, Impaired flexibility  Visit Diagnosis: Muscle weakness (generalized)  Diastasis recti  Muscular imbalance     Problem List Patient Active Problem List   Diagnosis Date Noted  . History of C-section 12/30/2017  . Left flank pain 12/14/2017  . Breast lump on right side at 11 o'clock position 10/09/2017   Cleophus MoltKeeli T. Gailes DPT, ATC Cleophus MoltKeeli T Gailes 05/22/2018, 8:22 PM  [place] [facility] MAIN [facility] SERVICES [address] [place], [place], [address] Phone: [phone]   Fax:  [phone]  Name: Shams Fill MRN: [id number] Date of Birth: [birth year]

## 2018-05-28 ENCOUNTER — Ambulatory Visit: Payer: Managed Care, Other (non HMO)

## 2018-05-28 DIAGNOSIS — M6281 Muscle weakness (generalized): Secondary | ICD-10-CM | POA: Diagnosis not present

## 2018-05-28 DIAGNOSIS — M629 Disorder of muscle, unspecified: Secondary | ICD-10-CM

## 2018-05-28 DIAGNOSIS — M6289 Other specified disorders of muscle: Secondary | ICD-10-CM

## 2018-05-28 DIAGNOSIS — M6208 Separation of muscle (nontraumatic), other site: Secondary | ICD-10-CM

## 2018-05-28 NOTE — Patient Instructions (Signed)
           Stand with equal weight on both feet engage the pelvic floor and lower abdomen and then lunge with the same leg 5 times in each direction, keeping foot forward and balancing for a second before placing the foot down between repetitions.  _2__ reps _1-2__ times per day.

## 2018-05-28 NOTE — Therapy (Signed)
[facility] MAIN [facility] SERVICES [address] [place], [place], [address] Phone: [phone]   Fax:  [phone]  Physical Therapy Treatment  Patient Details  Name: Tara Baldwin MRN: [id number] Date of Birth: [birth year] Referring Provider: Gunnar Bulla   Encounter Date: 05/28/2018  PT End of Session - 05/29/18 1226    Visit Number  5    Number of Visits  12    Date for PT Re-Evaluation  07/27/18    Authorization Type  [employer]    Authorization - Visit Number  5    Authorization - Number of Visits  12    PT Start Time  1100    PT Stop Time  1200    PT Time Calculation (min)  60 min    Activity Tolerance  Patient tolerated treatment well    Behavior During Therapy  [facility] for tasks assessed/performed       Past Medical History:  Diagnosis Date  . Headache   . STD (female) 05/16/2017   chlmydia    Past Surgical History:  Procedure Laterality Date  . CESAREAN SECTION N/A 12/19/2017   Procedure: CESAREAN SECTION;  Surgeon: Herold Harms, MD;  Location: [facility] ORS;  Service: Obstetrics;  Laterality: N/A;  . NO PAST SURGERIES      There were no vitals filed for this visit.    Pelvic Floor Physical Therapy Treatment Note  SCREENING  Changes in medications, allergies, or medical history?: no   SUBJECTIVE  Patient reports: Has not noticed any tightness in her back, has been slack on her hip flexor stretches, is really enjoying her cat stretch/exercise.   Precautions:  Breastfeeding  Pain update: no  Patient Goals: Improve diastasis. Be able to get back to dancing without causing pain or damage.   OBJECTIVE  Changes in: Posture/Observations:  Continues to demonstrate anterior pelvic tilt in standing and walking, though reduced.   Abdominal:  Mild skin irritation from k-tape, decreased separation of abdominal muscles in standing.  Palpation: TTP to R Psoas and Pectineus and R Psoas, Pectineus, and  Iliacus   INTERVENTIONS THIS SESSION: Manual: performed TP release to R Psoas and Pectineus and R Psoas, Pectineus, and Iliacus to decrease anterior pelvic tilt and allow for improved posture and deep core stability to decrease diastasis and prevent return of back pain. NM re-ed: Educated on and practiced multi-direction stepping to improve anticipatory response of PFM to movement in different directions and to allow for improved TA recruitment with movement to decrease pressure on low back and diastasis.  Total time: 60 min.                          PT Education - 05/29/18 1226    Education Details  See Pt. Instructions and Interventions this session.    Person(s) Educated  Patient    Methods  Explanation;Demonstration;Tactile cues;Verbal cues;Handout    Comprehension  Verbalized understanding;Returned demonstration;Verbal cues required;Tactile cues required       PT Short Term Goals - 05/04/18 0957      PT SHORT TERM GOAL #1   Title  Patient will demonstrate functional recruitment of TA with breathing, sit-to-stand, squatting/lifting, and walking to allow for improved pelvic brace coordination, improved balance, and decreased intraabdominal pressure to allow for healing of diastasis recti.    Baseline  paradoxical contraction with laugh, cough, etc.    Time  6    Period  Weeks    Status  New    Target Date  06/12/18      PT SHORT TERM GOAL #2   Title  Patient will demonstrate HEP x1 in the clinic to demonstrate understanding and proper form to allow for further improvement.    Time  6    Period  Weeks    Status  New    Target Date  06/12/18      PT SHORT TERM GOAL #3   Title  Pt. will demonstrate ability to actively maintain foce generation across diastasis with SLR to demonstrate readiness to increase exercise intensity for long-term goal attainment.    Time  6    Period  Weeks    Status  New    Target Date  06/12/18        PT Long Term Goals -  05/04/18 1002      PT LONG TERM GOAL #1   Title  Pt. will demonstrate 2 finger or less separation of diastasis recti without activation, which narrows with active contraction to less than 1 finger to demonstrate functional force transmission to protect abdomen and spine during high-level activity.    Baseline  4 fingers at rest    Time  12    Period  Weeks    Status  New    Target Date  07/27/18      PT LONG TERM GOAL #2   Title  Pt. Will demonstrate ability to return to dance for 30 min. with only light support from tape without demonstrating doming of abdomen.    Baseline  Doming with standing still    Time  12    Period  Weeks    Status  New    Target Date  07/27/18            Plan - 05/29/18 1226    Clinical Impression Statement  Pt. responded well to all interventions today, demonstrating decreased hip-flexor spasms and improved TA recruitment as well as understanding of all education provided. Continue per POC.    Clinical Presentation  Stable    Clinical Decision Making  Moderate    Rehab Potential  Excellent    Clinical Impairments Affecting Rehab Potential  Breast feeding, 5 months postpartum    PT Frequency  1x / week    PT Duration  12 weeks    PT Treatment/Interventions  ADLs/Self Care Home Management;Biofeedback;Electrical Stimulation;Functional mobility training;Therapeutic activities;Therapeutic exercise;Neuromuscular re-education;Patient/family education;Passive range of motion;Scar mobilization;Manual techniques;Dry needling;Taping    PT Next Visit Plan  integrate TA into more strengthening exercises, upgrade current HEP as appropriate.    PT Home Exercise Plan  [email or web address] , soda-can, log-roll, TA in Quad, hip-flexor stretch, multi-stepping.    Consulted and Agree with Plan of Care  Patient       Patient will benefit from skilled therapeutic intervention in order to improve the following deficits and impairments:  Increased fascial restricitons, Pain, Improper  body mechanics, Decreased coordination, Decreased scar mobility, Increased muscle spasms, Decreased range of motion, Decreased strength, Hypomobility, Decreased balance, Impaired flexibility  Visit Diagnosis: Muscle weakness (generalized)  Diastasis recti  Muscular imbalance     Problem List Patient Active Problem List   Diagnosis Date Noted  . History of C-section 12/30/2017  . Left flank pain 12/14/2017  . Breast lump on right side at 11 o'clock position 10/09/2017   Cleophus MoltKeeli T. Crystall Donaldson DPT, ATC Cleophus MoltKeeli T Julian Medina 05/29/2018, 12:30 PM  [facility] MAIN [facility] [address] [place],  [place], [address] Phone: [phone]   Fax:  [phone]  Name: Tara Baldwin MRN: [id number] Date of Birth: Oct 25, 1990

## 2018-06-04 ENCOUNTER — Ambulatory Visit: Payer: Managed Care, Other (non HMO)

## 2018-06-04 DIAGNOSIS — M6281 Muscle weakness (generalized): Secondary | ICD-10-CM

## 2018-06-04 DIAGNOSIS — M629 Disorder of muscle, unspecified: Secondary | ICD-10-CM

## 2018-06-04 DIAGNOSIS — M6208 Separation of muscle (nontraumatic), other site: Secondary | ICD-10-CM

## 2018-06-04 DIAGNOSIS — M6289 Other specified disorders of muscle: Secondary | ICD-10-CM

## 2018-06-04 NOTE — Therapy (Signed)
[place] [facility] [address] [place], [place], [address] Phone: [phone]   Fax:  [phone]  Physical Therapy Treatment  Patient Details  Name: Tara Baldwin MRN: [id number] Date of Birth: Mar 13, 1991 Referring Provider: Gunnar BullaLawhorn, Jenkins Michelle   Encounter Date: 06/04/2018    Past Medical History:  Diagnosis Date  . Headache   . STD (female) 05/16/2017   chlmydia    Past Surgical History:  Procedure Laterality Date  . CESAREAN SECTION N/A 12/19/2017   Procedure: CESAREAN SECTION;  Surgeon: Herold Harmsefrancesco, Martin A, MD;  Location: [facility] ORS;  Service: Obstetrics;  Laterality: N/A;  . NO PAST SURGERIES      There were no vitals filed for this visit.    Pelvic Floor Physical Therapy Treatment Note  SCREENING  Changes in medications, allergies, or medical history?: no    SUBJECTIVE  Patient reports: Struggling with stepping still. Had one day where she started to feel some tightness but was able to use stretches to manages. Has been walking more, trying to reach 10000 steps per day.   Precautions:  breastfeeding  Pain update: No pain  Patient Goals: Improve diastasis. Be able to get back to dancing without causing pain or damage.   OBJECTIVE  Changes in: Posture/Observations:    Range of Motion/Flexibilty:    Strength/MMT:  LE MMT:  Pelvic floor:  Abdominal:  Continues to have separation with single leg lift at diastasis.  Palpation:  Gait Analysis:  INTERVENTIONS THIS SESSION: NM re-ed: Reviewed mini-marches and multi-stepping with moderate verbal and tactile cueing to improve form and maximise benefit. Therex: Performed B calf stretch to inhibit over-dominance and educated on and practiced step-ups with focus on glute recruitment to allow pt to have better ability to integrate glute into stepping exercise.   Total time:                            PT Short  Term Goals - 06/04/18 1203      PT SHORT TERM GOAL #1   Title  Patient will demonstrate functional recruitment of TA with breathing, sit-to-stand, squatting/lifting, and walking to allow for improved pelvic brace coordination, improved balance, and decreased intraabdominal pressure to allow for healing of diastasis recti.      PT SHORT TERM GOAL #2   Title  Patient will demonstrate HEP x1 in the clinic to demonstrate understanding and proper form to allow for further improvement.      PT SHORT TERM GOAL #3   Title  Pt. will demonstrate ability to actively maintain foce generation across diastasis with SLR to demonstrate readiness to increase exercise intensity for long-term goal attainment.    Time  6    Period  Weeks    Status  On-going    Target Date  07/16/18        PT Long Term Goals - 05/04/18 1002      PT LONG TERM GOAL #1   Title  Pt. will demonstrate 2 finger or less separation of diastasis recti without activation, which narrows with active contraction to less than 1 finger to demonstrate functional force transmission to protect abdomen and spine during high-level activity.    Baseline  4 fingers at rest    Time  12    Period  Weeks    Status  New    Target Date  07/27/18      PT LONG TERM GOAL #2  Title  Pt. Will demonstrate ability to return to dance for 30 min. with only light support from tape without demonstrating doming of abdomen.    Baseline  Doming with standing still    Time  12    Period  Weeks    Status  New    Target Date  07/27/18              Patient will benefit from skilled therapeutic intervention in order to improve the following deficits and impairments:     Visit Diagnosis: Muscle weakness (generalized)  Diastasis recti  Muscular imbalance     Problem List Patient Active Problem List   Diagnosis Date Noted  . History of C-section 12/30/2017  . Left flank pain 12/14/2017  . Breast lump on right side at 11 o'clock position  10/09/2017   Tara Baldwin DPT, ATC Tara Baldwin 06/06/2018, 9:55 AM  [place] [facility] MAIN [facility] SERVICES [address] [place], [place], [address] Phone: [phone]   Fax:  [phone]  Name: Tara Baldwin MRN: [id number] Date of Birth: 1991/08/26

## 2018-06-06 NOTE — Patient Instructions (Signed)
Access Code: [email or web address]  URL: [email or web address]  Date: 06/04/2018  Prepared by: Flora LippsKeeli Lysander Calixte   Exercises  Seated Flexion Stretch - 3 reps - 5 Breaths hold - 1x daily - 7x weekly  Half Kneeling Hip Flexor Stretch - 3 reps - 5 Breaths hold - 1x daily - 7x weekly  TL Sidebending Stretch - Single Arm Overhead - 3 reps - 5 Breaths hold - 1x daily - 7x weekly  Supine March with Posterior Pelvic Tilt - 10 reps - 3 sets - 1x daily - 7x weekly  Bird Dog - 10 reps - 3 sets - 1x daily - 7x weekly  Step Up - 10 reps - 3 sets - 1x daily - 7x weekly

## 2018-06-11 ENCOUNTER — Ambulatory Visit: Payer: Managed Care, Other (non HMO) | Attending: Certified Nurse Midwife

## 2018-06-11 DIAGNOSIS — M6208 Separation of muscle (nontraumatic), other site: Secondary | ICD-10-CM | POA: Diagnosis present

## 2018-06-11 DIAGNOSIS — M6281 Muscle weakness (generalized): Secondary | ICD-10-CM

## 2018-06-11 DIAGNOSIS — M629 Disorder of muscle, unspecified: Secondary | ICD-10-CM | POA: Insufficient documentation

## 2018-06-11 DIAGNOSIS — M6289 Other specified disorders of muscle: Secondary | ICD-10-CM

## 2018-06-11 NOTE — Patient Instructions (Signed)
Seated lean back: Sit up tall and exhale as you gradually back until you feel like you are about to shake. Keep your tall spine as you come forward.

## 2018-06-11 NOTE — Therapy (Signed)
[facility] MAIN [facility] SERVICES [address] [place], [place], [address] Phone: [phone]   Fax:  [phone]  Physical Therapy Treatment  Patient Details  Name: Tara Baldwin MRN: [id number] Date of Birth: 10/18/1990 Referring Provider: Gunnar Bulla   Encounter Date: 06/11/2018  PT End of Session - 06/15/18 1239    Visit Number  6    Number of Visits  12    Date for PT Re-Evaluation  07/27/18    Authorization Type  [employer]    Authorization - Visit Number  6    Authorization - Number of Visits  12    PT Start Time  1100    PT Stop Time  1200    PT Time Calculation (min)  60 min    Activity Tolerance  Patient tolerated treatment well    Behavior During Therapy  [facility] for tasks assessed/performed       Past Medical History:  Diagnosis Date  . Headache   . STD (female) 05/16/2017   chlmydia    Past Surgical History:  Procedure Laterality Date  . CESAREAN SECTION N/A 12/19/2017   Procedure: CESAREAN SECTION;  Surgeon: Herold Harms, MD;  Location: [facility] ORS;  Service: Obstetrics;  Laterality: N/A;  . NO PAST SURGERIES      There were no vitals filed for this visit.   Pelvic Floor Physical Therapy Treatment Note  SCREENING  Changes in medications, allergies, or medical history?: no    SUBJECTIVE  Patient reports: She has been very tired this week, has been able to do her stepping 2 times this week and has done mini-marches but feels that they are getting easier and that straight-leg is too hard, she has also been doing step-ups and feels sore glutes.  Precautions:  breastfeeding  Pain update:  Location of pain: L arch of foot. Current pain:  1/10  Max pain:  9/10 Least pain:  0/10 Nature of pain: sharp  Patient Goals: Improve diastasis. Be able to get back to dancing without causing pain or damage.    OBJECTIVE  Changes in: Posture/Observations:  Continues to have significant doming with deep  inhale.  Abdominal:  Diastasis is 3 fingers at umbilicus. Able to improve approximation with manual over-pressure.   Palpation: TTP to L Pectineus  INTERVENTIONS THIS SESSION: Manual: performed TP release to L Pectineus to decrease spasm and improve balance of musculature acting on pelvis for improved posture and deep core stability. Therex: reviewed and practived mini-marches in supine and added half-extension of leg to increase challenge. Did 3x10 sitting hip flexion with deep core activation, 2x6 seated trunk lean-backs, and 3 chest-press with baby to improve strength and stability of deep core as base for other movement. Total time: 60 min                           PT Education - 06/15/18 1238    Education Details  See Pt. Instructions and Interventions this session.    Person(s) Educated  Patient    Methods  Explanation;Demonstration;Tactile cues;Verbal cues;Handout    Comprehension  Verbalized understanding;Returned demonstration;Verbal cues required;Tactile cues required       PT Short Term Goals - 06/04/18 1203      PT SHORT TERM GOAL #1   Title  Patient will demonstrate functional recruitment of TA with breathing, sit-to-stand, squatting/lifting, and walking to allow for improved pelvic brace coordination, improved balance, and decreased intraabdominal pressure to allow  for healing of diastasis recti.      PT SHORT TERM GOAL #2   Title  Patient will demonstrate HEP x1 in the clinic to demonstrate understanding and proper form to allow for further improvement.      PT SHORT TERM GOAL #3   Title  Pt. will demonstrate ability to actively maintain foce generation across diastasis with SLR to demonstrate readiness to increase exercise intensity for long-term goal attainment.    Time  6    Period  Weeks    Status  On-going    Target Date  07/16/18        PT Long Term Goals - 05/04/18 1002      PT LONG TERM GOAL #1   Title  Pt. will demonstrate 2  finger or less separation of diastasis recti without activation, which narrows with active contraction to less than 1 finger to demonstrate functional force transmission to protect abdomen and spine during high-level activity.    Baseline  4 fingers at rest    Time  12    Period  Weeks    Status  New    Target Date  07/27/18      PT LONG TERM GOAL #2   Title  Pt. Will demonstrate ability to return to dance for 30 min. with only light support from tape without demonstrating doming of abdomen.    Baseline  Doming with standing still    Time  12    Period  Weeks    Status  New    Target Date  07/27/18              Patient will benefit from skilled therapeutic intervention in order to improve the following deficits and impairments:     Visit Diagnosis: Muscle weakness (generalized)  Diastasis recti  Muscular imbalance     Problem List Patient Active Problem List   Diagnosis Date Noted  . History of C-section 12/30/2017  . Left flank pain 12/14/2017  . Breast lump on right side at 11 o'clock position 10/09/2017   Cleophus Molt DPT, ATC Cleophus Molt 06/15/2018, 12:40 PM  [place] [facility] MAIN [place] SERVICES [address] [place], [place], [address] Phone: [phone]   Fax:  [phone]  Name: Tara Baldwin MRN: [id number] Date of Birth: 04-02-1991

## 2018-06-18 ENCOUNTER — Ambulatory Visit: Payer: Managed Care, Other (non HMO)

## 2018-06-18 DIAGNOSIS — M6281 Muscle weakness (generalized): Secondary | ICD-10-CM | POA: Diagnosis not present

## 2018-06-18 DIAGNOSIS — M6208 Separation of muscle (nontraumatic), other site: Secondary | ICD-10-CM

## 2018-06-18 DIAGNOSIS — M629 Disorder of muscle, unspecified: Secondary | ICD-10-CM

## 2018-06-18 DIAGNOSIS — M6289 Other specified disorders of muscle: Secondary | ICD-10-CM

## 2018-06-18 NOTE — Therapy (Signed)
[facility] MAIN [facility] SERVICES [address] [place], [place], [address] Phone: [phone]   Fax:  [phone]  Physical Therapy Treatment  Patient Details  Name: Tara Baldwin MRN: [id number] Date of Birth: 1991-09-20 Referring Provider: Gunnar Bulla   Encounter Date: 06/18/2018  PT End of Session - 06/20/18 0940    Visit Number  7    Number of Visits  12    Date for PT Re-Evaluation  07/27/18    Authorization Type  [employer]    Authorization - Visit Number  7    Authorization - Number of Visits  12    PT Start Time  1100    PT Stop Time  1200    PT Time Calculation (min)  60 min    Activity Tolerance  Patient tolerated treatment well    Behavior During Therapy  [facility] for tasks assessed/performed       Past Medical History:  Diagnosis Date  . Headache   . STD (female) 05/16/2017   chlmydia    Past Surgical History:  Procedure Laterality Date  . CESAREAN SECTION N/A 12/19/2017   Procedure: CESAREAN SECTION;  Surgeon: Herold Harms, MD;  Location: [facility] ORS;  Service: Obstetrics;  Laterality: N/A;  . NO PAST SURGERIES      There were no vitals filed for this visit.    Pelvic Floor Physical Therapy Treatment Note  SCREENING  Changes in medications, allergies, or medical history?: no    SUBJECTIVE  Patient reports: She has been doing well, is having soreness near TA B with exercises and wants to try taping again because she knows that without it she does not think about using her muscles all the time the way she should.   Precautions:  Breastfeeding  Pain update: "sore" in lower abdomen  Patient Goals: Improve diastasis. Be able to get back to dancing without causing pain or damage.    OBJECTIVE  Changes in: Posture/Observations:  Overall posture is good, still has mild anterior pelvic tilt in standing.  Abdominal:  Having separation greatest with laughter.  Palpation: Decreased mobility of  fascia through lower abdomen and tenderness with cephalic mobility.   INTERVENTIONS THIS SESSION: Manual: performed MFR anteromedially to  B iliac crests for improved fascial mobility and decreased seaparation of rectus abdominus with TA contraction. NM re-ed: Educated on and practiced vocalizations with deep-core contraction to improve recruitment and timing for improved anterior wall stability. Worked up to quick "ha-ha" with slight pre-tuck with appropriate contraction/narrowing of diastasis. Re-applied k-tape for diastasis with use of a barrier swab first to minimize skin irritation so Pt. Can get more proprioceptive input on her activation level.  Total time: 60 min.                          PT Education - 06/20/18 0940    Education Details  See Interventions this session    Person(s) Educated  Patient    Methods  Explanation;Demonstration;Verbal cues;Tactile cues    Comprehension  Verbalized understanding;Returned demonstration;Verbal cues required;Tactile cues required       PT Short Term Goals - 06/18/18 1107      PT SHORT TERM GOAL #1   Title  Patient will demonstrate functional recruitment of TA with breathing, sit-to-stand, squatting/lifting, and walking to allow for improved pelvic brace coordination, improved balance, and decreased intraabdominal pressure to allow for healing of diastasis recti.    Baseline  paradoxical contraction with  laugh, cough, etc.    Time  6    Period  Weeks    Status  Achieved    Target Date  06/12/18      PT SHORT TERM GOAL #2   Title  Patient will demonstrate HEP x1 in the clinic to demonstrate understanding and proper form to allow for further improvement.    Time  6    Period  Weeks    Status  Achieved    Target Date  06/12/18      PT SHORT TERM GOAL #3   Title  Pt. will demonstrate ability to actively maintain foce generation across diastasis with SLR to demonstrate readiness to increase exercise intensity for  long-term goal attainment.    Time  6    Period  Weeks    Status  On-going    Target Date  07/16/18        PT Long Term Goals - 06/18/18 1108      PT LONG TERM GOAL #1   Title  Pt. will demonstrate 2 finger or less separation of diastasis recti without activation, which narrows with active contraction to less than 1 finger to demonstrate functional force transmission to protect abdomen and spine during high-level activity.    Baseline  4 fingers at rest    Time  12    Period  Weeks    Status  On-going    Target Date  07/27/18      PT LONG TERM GOAL #2   Title  Pt. Will demonstrate ability to return to dance for 30 min. with only light support from tape without demonstrating doming of abdomen.    Baseline  Doming with standing still    Time  12    Period  Weeks    Status  New    Target Date  07/27/18            Plan - 06/20/18 0941    Clinical Impression Statement  Pt. responded well to all treatment, demonstrating decreased pain with deep-core activation and improved approximation of abdominals with TA activation following treatment with slow and quick squeezes. Continue per POC.    Clinical Presentation  Stable    Clinical Decision Making  Moderate    Rehab Potential  Excellent    Clinical Impairments Affecting Rehab Potential  Breast feeding, 5 months postpartum    PT Frequency  1x / week    PT Duration  12 weeks    PT Treatment/Interventions  ADLs/Self Care Home Management;Biofeedback;Electrical Stimulation;Functional mobility training;Therapeutic activities;Therapeutic exercise;Neuromuscular re-education;Patient/family education;Passive range of motion;Scar mobilization;Manual techniques;Dry needling;Taping    PT Next Visit Plan  integrate TA into more strengthening exercises, upgrade current HEP as appropriate.    PT Home Exercise Plan  [email or web address] , soda-can, log-roll, TA in Quad, hip-flexor stretch, multi-stepping "ha-ha" exercise for timing with TA for cough, lugh.     Consulted and Agree with Plan of Care  Patient       Patient will benefit from skilled therapeutic intervention in order to improve the following deficits and impairments:  Increased fascial restricitons, Pain, Improper body mechanics, Decreased coordination, Decreased scar mobility, Increased muscle spasms, Decreased range of motion, Decreased strength, Hypomobility, Decreased balance, Impaired flexibility  Visit Diagnosis: Muscle weakness (generalized)  Diastasis recti  Muscular imbalance     Problem List Patient Active Problem List   Diagnosis Date Noted  . History of C-section 12/30/2017  . Left flank pain 12/14/2017  . Breast lump on right side  at 11 o'clock position 10/09/2017   Cleophus Molt DPT, ATC Cleophus Molt 06/20/2018, 9:45 AM  [facility] MAIN [facility] SERVICES [address] [place], [place], [address] Phone: [phone]   Fax:  [phone]  Name: Tara Baldwin MRN: [id number] Date of Birth: 1991-03-23

## 2018-06-25 ENCOUNTER — Ambulatory Visit: Payer: Managed Care, Other (non HMO)

## 2018-06-25 DIAGNOSIS — M6281 Muscle weakness (generalized): Secondary | ICD-10-CM | POA: Diagnosis not present

## 2018-06-25 DIAGNOSIS — M6208 Separation of muscle (nontraumatic), other site: Secondary | ICD-10-CM

## 2018-06-25 DIAGNOSIS — M6289 Other specified disorders of muscle: Secondary | ICD-10-CM

## 2018-06-25 DIAGNOSIS — M629 Disorder of muscle, unspecified: Secondary | ICD-10-CM

## 2018-06-25 NOTE — Therapy (Signed)
[facility] MAIN [facility] SERVICES [address] [place], [place], [address] Phone: [phone]   Fax:  [phone]  Physical Therapy Treatment  Patient Details  Name: Tara SouBreanna Baldwin MRN: [id number] Date of Birth: 02/02/1991 Referring Provider: Gunnar BullaLawhorn, Jenkins Michelle   Encounter Date: 06/25/2018  PT End of Session - 06/28/18 1750    Visit Number  8    Number of Visits  12    Date for PT Re-Evaluation  07/27/18    Authorization Type  [employer]    Authorization - Visit Number  8    Authorization - Number of Visits  12    PT Start Time  1100    PT Stop Time  1158    PT Time Calculation (min)  58 min       Past Medical History:  Diagnosis Date  . Headache   . STD (female) 05/16/2017   chlmydia    Past Surgical History:  Procedure Laterality Date  . CESAREAN SECTION N/A 12/19/2017   Procedure: CESAREAN SECTION;  Surgeon: Herold Harmsefrancesco, Martin A, MD;  Location: [facility] ORS;  Service: Obstetrics;  Laterality: N/A;  . NO PAST SURGERIES      There were no vitals filed for this visit.    Pelvic Floor Physical Therapy Treatment Note  SCREENING  Changes in medications, allergies, or medical history?: no    SUBJECTIVE  Patient reports: Has been struggling with the "ha-ha" exercise. Has been doing other exercises but has been a little more slack. Bought tape and has been trying to remember to tuck as she laughs at work.   Precautions:  Breastfeeding  Pain update:  Location of pain: Upper back  Current pain:  3/10  Max pain:  5-6/10 Least pain:  0/10 Nature of pain: tight  Patient Goals: Improve diastasis. Be able to get back to dancing without causing pain or damage.    OBJECTIVE  Changes in: Posture/Observations:  Is engaging as well as possible but still having some increased lordosis with carrying car-seat.  Abdominal:  Able to lean back ~ 30-35 degrees with control.  Palpation: TTP through upper thoracic paraspinals  and with PA mobs.  INTERVENTIONS THIS SESSION: Manual: Performed TP release to C7~T3 paraspinals and grade 3-4 mobs through thoracic spine to decrease spasm and stiffness and allow for improved posture for greater recruitment of deep core stability and decrease pain.  Therex: educated on how to perform thoracic extensions over towel roll to decrease stiffness and improve mobility through thoracic spine for decreased pain.   Total time: 58 min.                         PT Education - 06/28/18 1749    Education Details  See Pt. Instructions and interventons this session    Person(s) Educated  Patient    Methods  Explanation;Verbal cues;Handout    Comprehension  Verbalized understanding;Verbal cues required;Tactile cues required       PT Short Term Goals - 06/18/18 1107      PT SHORT TERM GOAL #1   Title  Patient will demonstrate functional recruitment of TA with breathing, sit-to-stand, squatting/lifting, and walking to allow for improved pelvic brace coordination, improved balance, and decreased intraabdominal pressure to allow for healing of diastasis recti.    Baseline  paradoxical contraction with laugh, cough, etc.    Time  6    Period  Weeks    Status  Achieved    Target Date  06/12/18      PT SHORT TERM GOAL #2   Title  Patient will demonstrate HEP x1 in the clinic to demonstrate understanding and proper form to allow for further improvement.    Time  6    Period  Weeks    Status  Achieved    Target Date  06/12/18      PT SHORT TERM GOAL #3   Title  Pt. will demonstrate ability to actively maintain foce generation across diastasis with SLR to demonstrate readiness to increase exercise intensity for long-term goal attainment.    Time  6    Period  Weeks    Status  On-going    Target Date  07/16/18        PT Long Term Goals - 06/18/18 1108      PT LONG TERM GOAL #1   Title  Pt. will demonstrate 2 finger or less separation of diastasis recti  without activation, which narrows with active contraction to less than 1 finger to demonstrate functional force transmission to protect abdomen and spine during high-level activity.    Baseline  4 fingers at rest    Time  12    Period  Weeks    Status  On-going    Target Date  07/27/18      PT LONG TERM GOAL #2   Title  Pt. Will demonstrate ability to return to dance for 30 min. with only light support from tape without demonstrating doming of abdomen.    Baseline  Doming with standing still    Time  12    Period  Weeks    Status  New    Target Date  07/27/18            Plan - 06/28/18 1750    Clinical Impression Statement  Pt. is responding well to therapy, she has not had any return of low back pain, lower abdominal pain, or spasm and is gaining strength in her deep core but is now having the load shift higher into her thoracic spine where stiffness and spasm remain. She responded well today to TP release and mobilization to decrease pain and stiffness and improve posture for lond-duration decrease in pain. She demonstrated understanding of all education provided. Continue per POC.    Clinical Presentation  Stable    Clinical Decision Making  Moderate    Rehab Potential  Excellent    Clinical Impairments Affecting Rehab Potential  Breast feeding, 5 months postpartum    PT Frequency  1x / week    PT Duration  12 weeks    PT Treatment/Interventions  ADLs/Self Care Home Management;Biofeedback;Electrical Stimulation;Functional mobility training;Therapeutic activities;Therapeutic exercise;Neuromuscular re-education;Patient/family education;Passive range of motion;Scar mobilization;Manual techniques;Dry needling;Taping    PT Next Visit Plan  Review "ha-ha" Manual to thoracic/ cervical junction PRN, integrate TA into more strengthening exercises, upgrade current HEP as appropriate. Consider integrating salsa/bachata with deep core stability    PT Home Exercise Plan  [email or web address] , soda-can,  log-roll, TA in Quad, hip-flexor stretch, multi-stepping "ha-ha" exercise for timing with TA for cough, lugh.    Consulted and Agree with Plan of Care  Patient       Patient will benefit from skilled therapeutic intervention in order to improve the following deficits and impairments:  Increased fascial restricitons, Pain, Improper body mechanics, Decreased coordination, Decreased scar mobility, Increased muscle spasms, Decreased range of motion, Decreased strength, Hypomobility, Decreased balance, Impaired flexibility  Visit Diagnosis: Muscle weakness (generalized)  Diastasis recti  Muscular imbalance     Problem List Patient Active Problem List   Diagnosis Date Noted  . History of C-section 12/30/2017  . Left flank pain 12/14/2017  . Breast lump on right side at 11 o'clock position 10/09/2017   Cleophus Molt DPT, ATC Cleophus Molt 06/28/2018, 5:58 PM  [facility] MAIN [facility] SERVICES [address] [place], [place], [address] Phone: [phone]   Fax:  [phone]  Name: Tara Baldwin MRN: [id number] Date of Birth: 12/31/1990

## 2018-06-25 NOTE — Patient Instructions (Signed)
   Place foam roller or towel under your upper back between your shoulder blades. Support your head with your hands, elbows forward, and gently rock back and forth and side to side to improve motion in your back.   Move the foam roller or towel up and down to a few spots in the upper back, repeating the process.    

## 2018-07-02 ENCOUNTER — Ambulatory Visit: Payer: Managed Care, Other (non HMO)

## 2018-07-02 DIAGNOSIS — M6281 Muscle weakness (generalized): Secondary | ICD-10-CM

## 2018-07-02 DIAGNOSIS — M6208 Separation of muscle (nontraumatic), other site: Secondary | ICD-10-CM

## 2018-07-02 DIAGNOSIS — M629 Disorder of muscle, unspecified: Secondary | ICD-10-CM

## 2018-07-02 DIAGNOSIS — M6289 Other specified disorders of muscle: Secondary | ICD-10-CM

## 2018-07-02 NOTE — Patient Instructions (Signed)
Access Code: [email or web address]  URL: [email or web address]  Date: 07/02/2018  Prepared by: Flora Lipps   Exercises  Seated Flexion Stretch - 3 reps - 5 Breaths hold - 1x daily - 7x weekly  Half Kneeling Hip Flexor Stretch - 3 reps - 5 Breaths hold - 1x daily - 7x weekly  TL Sidebending Stretch - Single Arm Overhead - 3 reps - 5 Breaths hold - 1x daily - 7x weekly  Supine March with Posterior Pelvic Tilt - 10 reps - 3 sets - 1x daily - 7x weekly  Bird Dog - 10 reps - 3 sets - 1x daily - 7x weekly  Step Up - 10 reps - 3 sets - 1x daily - 7x weekly  Half-Kneeling Trunk Rotation - 5 reps - 3 sets - 1x daily - 7x weekly

## 2018-07-02 NOTE — Therapy (Signed)
[facility] MAIN [facility] SERVICES [address] [place], [place], [address] Phone: [phone]   Fax:  [phone]  Physical Therapy Treatment  Patient Details  Name: Tara Baldwin MRN: [id number] Date of Birth: [birth year] Referring Provider (PT): Gunnar Bulla   Encounter Date: 07/02/2018  PT End of Session - 07/02/18 1659    Visit Number  9    Number of Visits  12    Date for PT Re-Evaluation  07/27/18    Authorization Type  [employer]    Authorization - Visit Number  9    Authorization - Number of Visits  12    PT Start Time  1100    PT Stop Time  1200    PT Time Calculation (min)  60 min    Activity Tolerance  Patient tolerated treatment well    Behavior During Therapy  [facility] for tasks assessed/performed       Past Medical History:  Diagnosis Date  . Headache   . STD (female) 05/16/2017   chlmydia    Past Surgical History:  Procedure Laterality Date  . CESAREAN SECTION N/A 12/19/2017   Procedure: CESAREAN SECTION;  Surgeon: Herold Harms, MD;  Location: [facility] ORS;  Service: Obstetrics;  Laterality: N/A;  . NO PAST SURGERIES      There were no vitals filed for this visit.    Pelvic Floor Physical Therapy Treatment Note  SCREENING  Changes in medications, allergies, or medical history?: no   SUBJECTIVE  Patient reports: Back was a little tight with drive to [place] yesterday but was able to stretch it out, her hip flexors got tight while walking to the beat yesterday. Is working on her marches and it is making her sweat.  Precautions:  Breastfeeding  Pain update: No pain  Patient Goals: Improve diastasis. Be able to get back to dancing without causing pain or damage.    OBJECTIVE  Changes in: Posture/Observations:  Able to lean back to ~ 45 deg. before she starts shaking while maintaining good approximation.  Abdominal:  With practice, Pt. Able to attain appropriate contraction of deep-core  with laughing, continues to demonstrate ~ 2.5 finger diastasis without active engagement, narrowing to about 1.5 with engagement.  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 Therex: educated on and practiced active psoas lengthening stretch and performed side and hip-flexor stretches following dance exercise to prevent tightening of lateral and anterior connective tissue. Reviewed lean-backs and helped Pt. Realize she can push herself further than she thought for further strengthening.  Ther-act: re-applied k-tape to improve proprioception and recruitment during NM re-ed and therex and with daily activity.  Total time: 60 min.                          PT Education - 07/02/18 1659    Education Details  See Pt. Instructions and Interventions today    Person(s) Educated  Patient    Methods  Explanation;Demonstration;Tactile cues;Verbal cues;Handout    Comprehension  Verbalized understanding;Returned demonstration;Verbal cues required;Tactile cues required  PT Short Term Goals - 06/18/18 1107      PT SHORT TERM GOAL #1   Title  Patient will demonstrate functional recruitment of TA with breathing, sit-to-stand, squatting/lifting, and walking to allow for improved pelvic brace coordination, improved balance, and decreased intraabdominal pressure to allow for healing of diastasis recti.    Baseline  paradoxical contraction with laugh, cough, etc.    Time  6    Period  Weeks    Status  Achieved    Target Date  06/12/18      PT  SHORT TERM GOAL #2   Title  Patient will demonstrate HEP x1 in the clinic to demonstrate understanding and proper form to allow for further improvement.    Time  6    Period  Weeks    Status  Achieved    Target Date  06/12/18      PT SHORT TERM GOAL #3   Title  Pt. will demonstrate ability to actively maintain foce generation across diastasis with SLR to demonstrate readiness to increase exercise intensity for long-term goal attainment.    Time  6    Period  Weeks    Status  On-going    Target Date  07/16/18        PT Long Term Goals - 06/18/18 1108      PT LONG TERM GOAL #1   Title  Pt. will demonstrate 2 finger or less separation of diastasis recti without activation, which narrows with active contraction to less than 1 finger to demonstrate functional force transmission to protect abdomen and spine during high-level activity.    Baseline  4 fingers at rest    Time  12    Period  Weeks    Status  On-going    Target Date  07/27/18      PT LONG TERM GOAL #2   Title  Pt. Will demonstrate ability to return to dance for 30 min. with only light support from tape without demonstrating doming of abdomen.    Baseline  Doming with standing still    Time  12    Period  Weeks    Status  New    Target Date  07/27/18            Plan - 07/02/18 1700    Clinical Impression Statement  Pt. responded well to allinterventions today, demonstrating improved ability to integrate TA into laughing and up-right latin dance motions necessary for her work with NM re-ed training. Continue per POC.     Clinical Presentation  Stable    Clinical Decision Making  Moderate    Rehab Potential  Excellent    Clinical Impairments Affecting Rehab Potential  Breast feeding, 5 months postpartum    PT Frequency  1x / week    PT Duration  12 weeks    PT Treatment/Interventions  ADLs/Self Care Home Management;Biofeedback;Electrical Stimulation;Functional mobility training;Therapeutic activities;Therapeutic  exercise;Neuromuscular re-education;Patient/family education;Passive range of motion;Scar mobilization;Manual techniques;Dry needling;Taping    PT Next Visit Plan  Begin knee-planks with TA and try low-boat pose. Add squat with overhead press, review HEP for "easy exercises"     PT Home Exercise Plan  [email or web address] , soda-can, log-roll, TA in Quad, hip-flexor stretch, multi-stepping "ha-ha" exercise for timing with TA for cough, laugh, active psoas stretch in kneeling.    Consulted and Agree with Plan of Care  Patient       Patient will benefit from skilled therapeutic intervention in order to improve the following  deficits and impairments:  Increased fascial restricitons, Pain, Improper body mechanics, Decreased coordination, Decreased scar mobility, Increased muscle spasms, Decreased range of motion, Decreased strength, Hypomobility, Decreased balance, Impaired flexibility  Visit Diagnosis: Muscle weakness (generalized)  Diastasis recti  Muscular imbalance     Problem List Patient Active Problem List   Diagnosis Date Noted  . History of C-section 12/30/2017  . Left flank pain 12/14/2017  . Breast lump on right side at 11 o'clock position 10/09/2017   Cleophus Molt DPT, ATC Cleophus Molt 07/02/2018, 5:17 PM  [facility] MAIN [facility] SERVICES [address] [place], [place], [address] Phone: [phone]   Fax:  [phone]  Name: Hadas Jessop MRN: [id number] Date of Birth: Feb 22, 1991

## 2018-07-09 ENCOUNTER — Ambulatory Visit: Payer: Managed Care, Other (non HMO) | Attending: Certified Nurse Midwife

## 2018-07-09 DIAGNOSIS — M6281 Muscle weakness (generalized): Secondary | ICD-10-CM

## 2018-07-09 DIAGNOSIS — M629 Disorder of muscle, unspecified: Secondary | ICD-10-CM | POA: Diagnosis present

## 2018-07-09 DIAGNOSIS — M6208 Separation of muscle (nontraumatic), other site: Secondary | ICD-10-CM

## 2018-07-09 DIAGNOSIS — M6289 Other specified disorders of muscle: Secondary | ICD-10-CM

## 2018-07-09 NOTE — Therapy (Signed)
[place] [facility] MAIN [facility] SERVICES [address] [place], [place], [address] Phone: [phone]   Fax:  [phone]  Physical Therapy Treatment  Patient Details  Name: Tara Baldwin MRN: [id number] Date of Birth: Feb 11, 1991 Referring Provider (PT): Gunnar Bulla   Encounter Date: 07/09/2018  PT End of Session - 07/12/18 2227    Visit Number  10    Number of Visits  12    Date for PT Re-Evaluation  07/27/18    Authorization Type  [employer]    Authorization - Visit Number  10    Authorization - Number of Visits  12    PT Start Time  1100    PT Stop Time  1200    PT Time Calculation (min)  60 min    Activity Tolerance  Patient tolerated treatment well    Behavior During Therapy  [facility] for tasks assessed/performed       Past Medical History:  Diagnosis Date  . Headache   . STD (female) 05/16/2017   chlmydia    Past Surgical History:  Procedure Laterality Date  . CESAREAN SECTION N/A 12/19/2017   Procedure: CESAREAN SECTION;  Surgeon: Herold Harms, MD;  Location: [facility] ORS;  Service: Obstetrics;  Laterality: N/A;  . NO PAST SURGERIES      There were no vitals filed for this visit.    Pelvic Floor Physical Therapy Treatment Note  SCREENING  Changes in medications, allergies, or medical history?: no    SUBJECTIVE  Patient reports: Her skin has damage from the tape again. She has been inconsistent with her exercises but but she has been having fun with her salsa.  Precautions:  Breastfeeding  Pain update: No pain  Patient Goals: Improve diastasis. Be able to get back to dancing without causing pain or damage.     OBJECTIVE  Changes in:  Abdominal:  Able to actively draw diastasis together for ~3-5 seconds in knee plank and modified low-boat with hands down.  Palpation: TTP to R>L Psoas and Pectineus.  INTERVENTIONS THIS SESSION: Therex: Educated on frog-stretch to maintain improved length in hip  flexor/adductors. Educated on and practiced modified low-boat and knee plank to continue to strengthen deep-core at a slightly greater challenge for greater deep-core support and to prevent worsening of diastasis. Manual: Performed TP release to B to Psoas and Pectineus to decrease spasm and allow for improved pelvic positioning for improved balance of musculature surrounding the pelvis and decreased tension/stredd on the diastasis.   Total time: 60 min.                         PT Education - 07/12/18 2226    Education Details  See Pt. Instructions and Interventions this session.    Person(s) Educated  Patient    Methods  Explanation;Demonstration;Tactile cues;Verbal cues;Handout    Comprehension  Verbalized understanding;Returned demonstration;Verbal cues required;Tactile cues required       PT Short Term Goals - 06/18/18 1107      PT SHORT TERM GOAL #1   Title  Patient will demonstrate functional recruitment of TA with breathing, sit-to-stand, squatting/lifting, and walking to allow for improved pelvic brace coordination, improved balance, and decreased intraabdominal pressure to allow for healing of diastasis recti.    Baseline  paradoxical contraction with laugh, cough, etc.    Time  6    Period  Weeks    Status  Achieved    Target Date  06/12/18  PT SHORT TERM GOAL #2   Title  Patient will demonstrate HEP x1 in the clinic to demonstrate understanding and proper form to allow for further improvement.    Time  6    Period  Weeks    Status  Achieved    Target Date  06/12/18      PT SHORT TERM GOAL #3   Title  Pt. will demonstrate ability to actively maintain foce generation across diastasis with SLR to demonstrate readiness to increase exercise intensity for long-term goal attainment.    Time  6    Period  Weeks    Status  On-going    Target Date  07/16/18        PT Long Term Goals - 06/18/18 1108      PT LONG TERM GOAL #1   Title  Pt. will  demonstrate 2 finger or less separation of diastasis recti without activation, which narrows with active contraction to less than 1 finger to demonstrate functional force transmission to protect abdomen and spine during high-level activity.    Baseline  4 fingers at rest    Time  12    Period  Weeks    Status  On-going    Target Date  07/27/18      PT LONG TERM GOAL #2   Title  Pt. Will demonstrate ability to return to dance for 30 min. with only light support from tape without demonstrating doming of abdomen.    Baseline  Doming with standing still    Time  12    Period  Weeks    Status  New    Target Date  07/27/18            Plan - 07/12/18 2228    Clinical Impression Statement  Pt. responded well to all interventions today, demonstrating decreased spasms and improved TA and deep-core recruitment and decreased diastasis with core activation. Continue per POC.    Clinical Presentation  Stable    Clinical Decision Making  Moderate    Rehab Potential  Excellent    Clinical Impairments Affecting Rehab Potential  Breast feeding, 5 months postpartum    PT Frequency  1x / week    PT Duration  12 weeks    PT Treatment/Interventions  ADLs/Self Care Home Management;Biofeedback;Electrical Stimulation;Functional mobility training;Therapeutic activities;Therapeutic exercise;Neuromuscular re-education;Patient/family education;Passive range of motion;Scar mobilization;Manual techniques;Dry needling;Taping    PT Next Visit Plan  Ask about new exercises,Add squat with overhead press, go through HEP and make training program for greates gain    PT Home Exercise Plan  [id number] , soda-can, log-roll, TA in Quad, hip-flexor stretch, multi-stepping "ha-ha" exercise for timing with TA for cough, laugh, active psoas stretch in kneeling.    Consulted and Agree with Plan of Care  Patient       Patient will benefit from skilled therapeutic intervention in order to improve the following deficits and  impairments:  Increased fascial restricitons, Pain, Improper body mechanics, Decreased coordination, Decreased scar mobility, Increased muscle spasms, Decreased range of motion, Decreased strength, Hypomobility, Decreased balance, Impaired flexibility  Visit Diagnosis: Muscle weakness (generalized)  Diastasis recti  Muscular imbalance     Problem List Patient Active Problem List   Diagnosis Date Noted  . History of C-section 12/30/2017  . Left flank pain 12/14/2017  . Breast lump on right side at 11 o'clock position 10/09/2017   Cleophus Molt DPT, ATC Cleophus Molt 07/12/2018, 10:33 PM  [facility] MAIN  [facility] SERVICES [address] [place], [place], [address] Phone: [phone]   Fax:  [phone]  Name: Tara Baldwin MRN: [id number] Date of Birth: 03-29-1991

## 2018-07-09 NOTE — Patient Instructions (Addendum)
   Keep hands down behind you for now. Hold for ~ 3-5 seconds or as long as you can before you feel like your posture is slipping. Repeat 3-5 times (this replaces your lean-backs)   Start in table top, activate your lower tummy, then shift forward until your hips are in line with your knees and shoulders. Hold for 3-5 seconds, repeat 3-5 times. (this replaces your  Cat-cow)    Hold for 5 deep breaths, repeat 2-3 times.

## 2018-07-13 ENCOUNTER — Ambulatory Visit: Payer: Managed Care, Other (non HMO)

## 2018-07-13 DIAGNOSIS — M6289 Other specified disorders of muscle: Secondary | ICD-10-CM

## 2018-07-13 DIAGNOSIS — M629 Disorder of muscle, unspecified: Secondary | ICD-10-CM

## 2018-07-13 DIAGNOSIS — M6281 Muscle weakness (generalized): Secondary | ICD-10-CM | POA: Diagnosis not present

## 2018-07-13 DIAGNOSIS — M6208 Separation of muscle (nontraumatic), other site: Secondary | ICD-10-CM

## 2018-07-13 NOTE — Patient Instructions (Signed)
Look up book "Baby Bod" to help you know what you can do for exercise if you do decide to have another child and as deep core strength for diastasis support moving forward.    Inhale as you lower down gradually releasing the glutes and the lower tummy muscles. As you exhale, gradually increase the tension in the glutes and lower tummy until you reach the top and give an "extra squeeze" at the top as you press the arms overhead. Repeat 5x3__, _1-2__ Times per day.    Place foam roller or towel under your upper back between your shoulder blades. Support your head with your hands, elbows forward, and gently rock back and forth and side to side to improve motion in your back.   Move the foam roller or towel up and down to a few spots in the upper back, repeating the process.            Stand with equal weight on both feet engage the pelvic floor and lower abdomen and then lunge with the same leg 5 times in each direction, keeping foot forward and balancing for a second before placing the foot down between repetitions.  _5__ reps __1-2_ times per day.
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
## 2018-07-24 ENCOUNTER — Ambulatory Visit: Payer: Managed Care, Other (non HMO)

## 2018-07-24 DIAGNOSIS — M6281 Muscle weakness (generalized): Secondary | ICD-10-CM | POA: Diagnosis not present

## 2018-07-24 DIAGNOSIS — M6208 Separation of muscle (nontraumatic), other site: Secondary | ICD-10-CM

## 2018-07-24 DIAGNOSIS — M6289 Other specified disorders of muscle: Secondary | ICD-10-CM

## 2018-07-24 DIAGNOSIS — M629 Disorder of muscle, unspecified: Secondary | ICD-10-CM

## 2018-07-24 NOTE — Patient Instructions (Signed)
    Sit up with a tall spine and cross one leg over the other knee. Hinge from the hip and lean until you can feel a stretch through your bottom hold for 5 deep breaths and then switch sides. Repeat 2-3 times on each side.   

## 2018-07-24 NOTE — Therapy (Signed)
[place] MAIN [facility] SERVICES [address] [place], [place], [address] Phone: [phone]   Fax:  [phone]  Physical Therapy Treatment  Patient Details  Name: Tara Baldwin MRN: [id number] Date of Birth: March 30, 1991 Referring Provider (PT): Diona Fanti   Encounter Date: 07/24/2018  PT End of Session - 07/28/18 0809    Visit Number  12    Number of Visits  12    Date for PT Re-Evaluation  10/06/18    Authorization Type  Cigna    Authorization - Visit Number  12    Authorization - Number of Visits  12    PT Start Time  6962    PT Stop Time  1130    PT Time Calculation (min)  55 min    Activity Tolerance  Patient tolerated treatment well    Behavior During Therapy  [facility] for tasks assessed/performed       Past Medical History:  Diagnosis Date  . Headache   . STD (female) 05/16/2017   chlmydia    Past Surgical History:  Procedure Laterality Date  . CESAREAN SECTION N/A 12/19/2017   Procedure: CESAREAN SECTION;  Surgeon: Brayton Mars, MD;  Location: [facility] ORS;  Service: Obstetrics;  Laterality: N/A;  . NO PAST SURGERIES      There were no vitals filed for this visit.    Pelvic Floor Physical Therapy Treatment Note  SCREENING  Changes in medications, allergies, or medical history?: no    SUBJECTIVE  Patient reports: She is mentally and physically still on vacation and has not gotten back into her exercises.  Precautions:  Breastfeeding.  Pain update: Little shooting pains to the tailbone occasionally.   Patient Goals: Improve diastasis. Be able to get back to dancing without causing pain or damage.     OBJECTIVE  Changes in:  Range of Motion/Flexibilty:  Decreased sacral mobility with pain with pressure.  Abdominal:  Pt. Having a harder time closing the diastasis actively than she did at last visit. 3 finger separation.  Palpation: TTP to B Obliques and QL, no longer has fascial  restriction in lower abdomen.  INTERVENTIONS THIS SESSION: Manual: Performed TP release to B Obliques and QL to decrease tension acting on diastasis into separation and allow for further decrease of separation by allowing improved ROM and recruitment of TA. Peformed PA mobs to sacral borders to improve mobility, take pressure off of nerve roots, and allow for improved posture and decreased pain. Therex: Educated on and practiced piriformis stretch to improve/maintain sacral mobility for decreased pain and improved posture.  Total time: 55 min.                         PT Education - 07/28/18 0809    Education Details  See Pt. Instructions and Interventions this session    Person(s) Educated  Patient    Methods  Explanation;Demonstration;Verbal cues;Handout    Comprehension  Verbalized understanding;Returned demonstration;Verbal cues required       PT Short Term Goals - 07/28/18 0810      PT SHORT TERM GOAL #1   Title  Patient will demonstrate functional recruitment of TA with breathing, sit-to-stand, squatting/lifting, and walking to allow for improved pelvic brace coordination, improved balance, and decreased intraabdominal pressure to allow for healing of diastasis recti.    Baseline  paradoxical contraction with laugh, cough, etc.    Time  6    Period  Weeks  Status  Achieved    Target Date  06/12/18      PT SHORT TERM GOAL #2   Title  Patient will demonstrate HEP x1 in the clinic to demonstrate understanding and proper form to allow for further improvement.    Time  6    Period  Weeks    Status  Achieved    Target Date  06/12/18      PT SHORT TERM GOAL #3   Title  Pt. will demonstrate ability to actively maintain foce generation across diastasis with SLR to demonstrate readiness to increase exercise intensity for long-term goal attainment.    Time  6    Period  Weeks    Status  Achieved    Target Date  07/16/18        PT Long Term Goals -  07/28/18 9480      PT LONG TERM GOAL #1   Title  Pt. will demonstrate 2 finger or less separation of diastasis recti without activation, which narrows with active contraction to less than 1 finger to demonstrate functional force transmission to protect abdomen and spine during high-level activity.    Baseline  4 fingers at rest, 3 fingers at rest, was narrowing to 2 fingers actively but has had minor set-back and is inconsistent currently.     Time  12    Period  Weeks    Status  On-going    Target Date  10/06/18      PT LONG TERM GOAL #2   Title  Pt. Will demonstrate ability to return to dance for 30 min. with only light support from tape without demonstrating doming of abdomen.    Baseline  Doming with standing still, As of 10/18: is not doming in standing but inconsistently is either separating or narrowing with activation.    Time  12    Period  Weeks    Status  New    Target Date  10/06/18            Plan - 07/28/18 1655    Clinical Impression Statement  Pt. responded well to all interventions today, demonstrating improved recruitment, decreased restriction and improved control of TA for narrowing of diastasis as well as improved sacral mobility following treatment. She has made great improvement thus far but continues to demonstrate inconsistency in her ability to create force closure of the rectus abdominus and occasional pain and return of spasm as she is gaining strength and  balance of musculature. 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     Clinical Presentation  Stable    Clinical Decision Making  Moderate    Rehab Potential  Excellent    Clinical Impairments Affecting Rehab Potential  Breast  feeding, 5 months postpartum    PT Frequency  1x / week    PT Duration  12 weeks    PT Treatment/Interventions  ADLs/Self Care Home Management;Biofeedback;Electrical Stimulation;Functional mobility training;Therapeutic activities;Therapeutic exercise;Neuromuscular re-education;Patient/family education;Passive range of motion;Scar mobilization;Manual techniques;Dry needling;Taping    PT Next Visit Plan  re-assess diastasis with SLR, continue return-to-sport training. MFR and TP release PRN to low back and lateral abdomen.    PT Home Exercise Plan  [id number] , soda-can, log-roll, hip-flexor stretch, multi-stepping "ha-ha"  exercise for timing with TA for cough, laugh, active psoas stretch in kneeling, mod. Boat pose, knee plank, overhead squat-press, piriformis stretch.    Consulted and Agree with Plan of Care  Patient       Patient will benefit from skilled therapeutic intervention in order to improve the following deficits and impairments:  Increased fascial restricitons, Pain, Improper body mechanics, Decreased coordination, Decreased scar mobility, Increased muscle spasms, Decreased range of motion, Decreased strength, Hypomobility, Decreased balance, Impaired flexibility  Visit Diagnosis: Muscle weakness (generalized)  Diastasis recti  Muscular imbalance     Problem List Patient Active Problem List   Diagnosis Date Noted  . History of C-section 12/30/2017  . Left flank pain 12/14/2017  . Breast lump on right side at 11 o'clock position 10/09/2017   Willa Rough DPT, ATC Willa Rough 07/28/2018, 8:23 AM  [place] MAIN [facility] SERVICES [address] [place], [place], [address] Phone: [phone]   Fax:  [phone]  Name: Corena Tilson MRN: [id number] Date of Birth: 07/11/1991

## 2018-08-14 ENCOUNTER — Ambulatory Visit: Payer: Managed Care, Other (non HMO) | Attending: Certified Nurse Midwife

## 2018-08-14 DIAGNOSIS — M6281 Muscle weakness (generalized): Secondary | ICD-10-CM

## 2018-08-14 DIAGNOSIS — M6208 Separation of muscle (nontraumatic), other site: Secondary | ICD-10-CM

## 2018-08-14 DIAGNOSIS — M629 Disorder of muscle, unspecified: Secondary | ICD-10-CM | POA: Diagnosis present

## 2018-08-14 DIAGNOSIS — M6289 Other specified disorders of muscle: Secondary | ICD-10-CM

## 2018-08-14 NOTE — Therapy (Signed)
[facility] MAIN [facility] SERVICES [address] [place], [place], [address] Phone: [phone]   Fax:  [phone]  Physical Therapy Treatment  Patient Details  Name: Tara Baldwin MRN: [id number] Date of Birth: 1991/07/02 Referring Provider (PT): Gunnar Bulla   Encounter Date: 08/14/2018  PT End of Session - 08/14/18 1752    Visit Number  13    Number of Visits  17    Date for PT Re-Evaluation  10/06/18    Authorization Type  [employer]    Authorization - Visit Number  1    Authorization - Number of Visits  5    PT Start Time  0910    PT Stop Time  1010    PT Time Calculation (min)  60 min    Activity Tolerance  Patient tolerated treatment well    Behavior During Therapy  [facility] for tasks assessed/performed       Past Medical History:  Diagnosis Date  . Headache   . STD (female) 05/16/2017   chlmydia    Past Surgical History:  Procedure Laterality Date  . CESAREAN SECTION N/A 12/19/2017   Procedure: CESAREAN SECTION;  Surgeon: Herold Harms, MD;  Location: [facility] ORS;  Service: Obstetrics;  Laterality: N/A;  . NO PAST SURGERIES      There were no vitals filed for this visit.    Pelvic Floor Physical Therapy Treatment Note  SCREENING  Changes in medications, allergies, or medical history?: no    SUBJECTIVE  Patient reports: Things are going OK, is not as consistent as she wants to be. Has been using tape to help her use the TA well. Feels that she gets it the majority of the time but has been having a hard time over the last week with motivation and exercises.  Precautions:  Breastfeeding.  Pain update:  Location of pain: Tailbone Current pain:  4/10  Max pain:  5/10 Least pain:  0/10 Nature of pain: sharp with certain motions  * no pain following treatment  Patient Goals: TTP to B Obliques and QL, no longer has fascial restriction in lower abdomen.   OBJECTIVE  Changes in: Posture/Observations:   Anterior pelvic tilt and mild hyperlordosis  Range of Motion/Flexibilty:  Decreased sacral mobility B and decreased hip extension ROM.  Abdominal:  Pt. demontrates overactive obliques when trying to engage TA for deep-core stability exercises with some hesitation where TA is trying but being overpowered. Improved but still problematic following treatment.  Palpation: TTP to B Piriformis near sacral border, decreased fascial mobility surrounding the SIJ. "ticklishness" at B Obliques pre treatment, all improved following treatment.   INTERVENTIONS THIS SESSION: Manual: performed TP release/MFR to B obliques and Piriformis to decrease spasm and improve mobility and recruitment to alow improved diastasis closure. Performed MWM to B sacral borders to restore motion and decrease pain around the tailbone.   Total time: 60 min.                          PT Education - 08/14/18 1750    Education Details  Educated on the importace of caring for herself to allow herself to care for her family. reviewed improtance of staying on top of her HEP.    Person(s) Educated  Patient    Methods  Explanation    Comprehension  Verbalized understanding       PT Short Term Goals - 07/28/18 0810      PT  SHORT TERM GOAL #1   Title  Patient will demonstrate functional recruitment of TA with breathing, sit-to-stand, squatting/lifting, and walking to allow for improved pelvic brace coordination, improved balance, and decreased intraabdominal pressure to allow for healing of diastasis recti.    Baseline  paradoxical contraction with laugh, cough, etc.    Time  6    Period  Weeks    Status  Achieved    Target Date  06/12/18      PT SHORT TERM GOAL #2   Title  Patient will demonstrate HEP x1 in the clinic to demonstrate understanding and proper form to allow for further improvement.    Time  6    Period  Weeks    Status  Achieved    Target Date  06/12/18      PT SHORT TERM GOAL #3    Title  Pt. will demonstrate ability to actively maintain foce generation across diastasis with SLR to demonstrate readiness to increase exercise intensity for long-term goal attainment.    Time  6    Period  Weeks    Status  Achieved    Target Date  07/16/18        PT Long Term Goals - 07/28/18 1610      PT LONG TERM GOAL #1   Title  Pt. will demonstrate 2 finger or less separation of diastasis recti without activation, which narrows with active contraction to less than 1 finger to demonstrate functional force transmission to protect abdomen and spine during high-level activity.    Baseline  4 fingers at rest, 3 fingers at rest, was narrowing to 2 fingers actively but has had minor set-back and is inconsistent currently.     Time  12    Period  Weeks    Status  On-going    Target Date  10/06/18      PT LONG TERM GOAL #2   Title  Pt. Will demonstrate ability to return to dance for 30 min. with only light support from tape without demonstrating doming of abdomen.    Baseline  Doming with standing still, As of 10/18: is not doming in standing but inconsistently is either separating or narrowing with activation.    Time  12    Period  Weeks    Status  New    Target Date  10/06/18            Plan - 08/14/18 1756    Clinical Impression Statement  Pt. responded well to all interventions today, demonstrating full reduction of tailbone pain following treatment and improved ability to recruit TA. Continue per POC.    Clinical Presentation  Stable    Clinical Decision Making  Moderate    PT Frequency  1x / week    PT Duration  12 weeks    PT Treatment/Interventions  ADLs/Self Care Home Management;Biofeedback;Electrical Stimulation;Functional mobility training;Therapeutic activities;Therapeutic exercise;Neuromuscular re-education;Patient/family education;Passive range of motion;Scar mobilization;Manual techniques;Dry needling;Taping    PT Next Visit Plan  re-assess diastasis with SLR,  continue return-to-sport training. MFR and TP release PRN to low back and lateral abdomen.    PT Home Exercise Plan  [id number] , soda-can, log-roll, hip-flexor stretch, multi-stepping "ha-ha" exercise for timing with TA for cough, laugh, active psoas stretch in kneeling, mod. Boat pose, knee plank, overhead squat-press, piriformis stretch.    Consulted and Agree with Plan of Care  Patient       Patient will benefit from skilled therapeutic intervention in order to improve the  following deficits and impairments:     Visit Diagnosis: Muscle weakness (generalized)  Diastasis recti  Muscular imbalance     Problem List Patient Active Problem List   Diagnosis Date Noted  . History of C-section 12/30/2017  . Left flank pain 12/14/2017  . Breast lump on right side at 11 o'clock position 10/09/2017   Cleophus Molt DPT, ATC Cleophus Molt 08/14/2018, 5:59 PM  [place] [facility] MAIN [facility] SERVICES [address] [place], [place], [address] Phone: [phone]   Fax:  [phone]  Name: Catori Panozzo MRN: [id number] Date of Birth: Dec 05, 1990

## 2018-08-20 ENCOUNTER — Ambulatory Visit (INDEPENDENT_AMBULATORY_CARE_PROVIDER_SITE_OTHER): Payer: Managed Care, Other (non HMO) | Admitting: Certified Nurse Midwife

## 2018-08-20 ENCOUNTER — Encounter: Payer: Self-pay | Admitting: Certified Nurse Midwife

## 2018-08-20 VITALS — BP 130/68 | HR 67 | Ht 66.0 in | Wt 163.0 lb

## 2018-08-20 DIAGNOSIS — Z98891 History of uterine scar from previous surgery: Secondary | ICD-10-CM

## 2018-08-20 DIAGNOSIS — Z3041 Encounter for surveillance of contraceptive pills: Secondary | ICD-10-CM

## 2018-08-20 DIAGNOSIS — Z01419 Encounter for gynecological examination (general) (routine) without abnormal findings: Secondary | ICD-10-CM | POA: Diagnosis not present

## 2018-08-20 DIAGNOSIS — M6208 Separation of muscle (nontraumatic), other site: Secondary | ICD-10-CM

## 2018-08-20 DIAGNOSIS — Z124 Encounter for screening for malignant neoplasm of cervix: Secondary | ICD-10-CM | POA: Diagnosis not present

## 2018-08-20 DIAGNOSIS — N941 Unspecified dyspareunia: Secondary | ICD-10-CM

## 2018-08-20 HISTORY — DX: Separation of muscle (nontraumatic), other site: M62.08

## 2018-08-20 MED ORDER — NORETHIN ACE-ETH ESTRAD-FE 1-20 MG-MCG PO TABS: 1.0000 | ORAL_TABLET | Freq: Every day | ORAL | 11 refills | Status: DC

## 2018-08-20 NOTE — Progress Notes (Signed)
ANNUAL PREVENTATIVE CARE GYN  ENCOUNTER NOTE  Subjective:       Tara Baldwin is a 28 y.o. G61P1001 female here for a routine annual gynecologic exam.  Current complaints: 1. Needs Pap smear 2. Pain with intercourse especially with deep thrusting 3. Intermittent numbness a c-section scar  Doing well overall. 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   Attending physical therapy for management of diastasis recti. Has exercises for management of incision pain and dyspareunia as well, but reports she doesn't consistently complete.   Denies difficulty breathing or respiratory distress, chest pain, abdominal pain, excessive vaginal bleeding, dysuria, and leg pain or swelling.   Declines STI screening.    Gynecologic History  Patient's last menstrual period was 08/17/2018 (exact date).  Period Cycle (Days): 28 Period Duration (Days): 5 Period Pattern: Regular Menstrual Flow: Light Menstrual Control: Thin pad Dysmenorrhea: (!) Moderate Dysmenorrhea Symptoms: Cramping  Contraception: OCP (estrogen/progesterone)   Last Pap: 09/2016. Results were: normal; History LSIL: 11/2015  Obstetric History  OB History  Gravida Para Term Preterm AB Living  1 1 1     1   SAB TAB Ectopic Multiple Live Births        0 1    # Outcome Date GA Lbr Len/2nd Weight Sex Delivery Anes PTL Lv  1 Term 12/19/17 [redacted]w[redacted]d  7 lb 0.9 oz (3.2 kg) M CS-LTranv EPI  LIV    Past Medical History:  Diagnosis Date  . Headache   . STD (female) 05/16/2017   chlmydia    Past Surgical History:  Procedure Laterality Date  . CESAREAN SECTION N/A 12/19/2017   Procedure: CESAREAN SECTION;  Surgeon: Herold Harms, MD;  Location: [facility] ORS;  Service: Obstetrics;  Laterality: N/A;    Current Outpatient Medications on File Prior to Visit  Medication Sig Dispense Refill  . Cholecalciferol 50 MCG (2000 UT) CHEW Chew 2,000 Int'l Units by mouth daily.    . norethindrone-ethinyl estradiol  (JUNEL FE 1/20) 1-20 MG-MCG tablet Take 1 tablet by mouth daily. 1 Package 11   No current facility-administered medications on file prior to visit.     No Known Allergies  Social History   Socioeconomic History  . Marital status: Single    Spouse name: Not on file  . Number of children: Not on file  . Years of education: Not on file  . Highest education level: Not on file  Occupational History  . Not on file  Social Needs  . Financial resource strain: Not on file  . Food insecurity:    Worry: Not on file    Inability: Not on file  . Transportation needs:    Medical: Not on file    Non-medical: Not on file  Tobacco Use  . Smoking status: Never Smoker  . Smokeless tobacco: Never Used  Substance and Sexual Activity  . Alcohol use: No  . Drug use: No  . Sexual activity: Yes    Birth control/protection: Pill  Lifestyle  . Physical activity:    Days per week: Not on file    Minutes per session: Not on file  . Stress: Not on file  Relationships  . Social connections:    Talks on phone: Not on file    Gets together: Not on file    Attends religious service: Not on file    Active member of club or organization: Not on file    Attends meetings of clubs or organizations: Not on file  Relationship status: Not on file  . Intimate partner violence:    Fear of current or ex partner: Not on file    Emotionally abused: Not on file    Physically abused: Not on file    Forced sexual activity: Not on file  Other Topics Concern  . Not on file  Social History Narrative  . Not on file    Family History  Problem Relation Age of Onset  . Cancer Paternal Grandmother        Breast  . Breast cancer Paternal Grandmother   . Diabetes Neg Hx   . Ovarian cancer Neg Hx   . Colon cancer Neg Hx     The following portions of the patient's history were reviewed and updated as appropriate: allergies, current medications, past family history, past medical history, past social history,  past surgical history and problem list.  Review of Systems  ROS negative except as noted above. Information obtained from patient.    Objective:   BP 130/68   Pulse 67   Ht 5\' 6"  (1.676 m)   Wt 163 lb (73.9 kg)   LMP 08/17/2018 (Exact Date)   BMI 26.31 kg/m    CONSTITUTIONAL: Well-developed, well-nourished female in no acute distress.   PSYCHIATRIC: Normal mood and affect. Normal behavior. Normal judgment and thought content.  NEUROLGIC: Alert and oriented to person, place, and time. Normal  muscle tone coordination. No cranial nerve deficit noted.  HENT:  Normocephalic, atraumatic, External right and left ear normal. Oropharynx is clear and moist  EYES: Conjunctivae and EOM are normal. Pupils are equal and round.   NECK: Normal range of motion, supple, no masses.  Normal thyroid.   SKIN: Skin is warm and dry. No rash noted. Not diaphoretic. No  erythema. No pallor.  CARDIOVASCULAR: Normal heart rate noted, regular rhythm, no murmur.  RESPIRATORY: Clear to auscultation bilaterally. Effort and breath  sounds normal, no problems with respiration noted.  BREASTS: Symmetric in size. No masses, skin changes, nipple  drainage, or lymphadenopathy.  ABDOMEN: Soft, normal bowel sounds, no distention noted.  No tenderness, rebound or guarding.   PELVIC:  External Genitalia: Normal  Vagina: Normal  Cervix: Normal  Uterus: Normal  Adnexa: Normal  MUSCULOSKELETAL: Normal range of motion. No tenderness.  No cyanosis, clubbing, or edema.  2+ distal pulses.  LYMPHATIC: No Axillary, Supraclavicular, or Inguinal Adenopathy.  Assessment:   Annual gynecologic examination 27 y.o.   Contraception: OCP (estrogen/progesterone)   Overweight   Problem List Items Addressed This Visit    None    Visit Diagnoses    Well woman exam    -  Primary   Relevant Orders   Pap IG, rfx HPV ASCU,16/18   NuSwab Vaginitis (VG)   Screening for cervical cancer       Relevant Orders   Pap  IG, rfx HPV ASCU,16/18   Dyspareunia in female       Relevant Orders   NuSwab Vaginitis (VG)   Surveillance for birth control, oral contraceptives       History of cesarean section          Plan:   Pap: Pap, Reflex if ASCUS and NuSwab collected; will contact patient with results  Labs: Declined   Rx: Junel, see orders  Routine preventative health maintenance measures emphasized: Exercise/Diet/Weight control, Tobacco Warnings, Alcohol/Substance use risks, Stress Management, Peer Pressure Issues and Safe Sex; see AVS  Reviewed red flag symptoms and when to call  RTC x 1 year  for [employer] [employer] or sooner if needed   Gunnar Bulla, CNM [facility]

## 2018-08-20 NOTE — Patient Instructions (Addendum)
Ethinyl Estradiol; Norethindrone Acetate; Ferrous fumarate tablets or capsules What is this medicine? ETHINYL ESTRADIOL; NORETHINDRONE ACETATE; FERROUS FUMARATE (ETH in il es tra DYE ole; nor eth IN drone AS e tate; FER us FUE ma rate) is an oral contraceptive. The products combine two types of female hormones, an estrogen and a progestin. They are used to prevent ovulation and pregnancy. Some products are also used to treat acne in females. This medicine may be used for other purposes; ask your health care provider or pharmacist if you have questions. COMMON BRAND NAME(S): Blisovi 24 Fe, Blisovi Fe, Estrostep Fe, Gildess 24 Fe, Gildess Fe 1.5/30, Gildess Fe 1/20, Junel Fe 1.5/30, Junel Fe 1/20, Junel Fe 24, Larin Fe, Lo Loestrin Fe, Loestrin 24 Fe, Loestrin FE 1.5/30, Loestrin FE 1/20, Lomedia 24 Fe, Microgestin 24 Fe, Microgestin Fe 1.5/30, Microgestin Fe 1/20, Tarina Fe 1/20, Taytulla, Tilia Fe, Tri-Legest Fe What should I tell my health care provider before I take this medicine? They need to know if you have any of these conditions: -abnormal vaginal bleeding -blood vessel disease -breast, cervical, endometrial, ovarian, liver, or uterine cancer -diabetes -gallbladder disease -heart disease or recent heart attack -high blood pressure -high cholesterol -history of blood clots -kidney disease -liver disease -migraine headaches -smoke tobacco -stroke -systemic lupus erythematosus (SLE) -an unusual or allergic reaction to estrogens, progestins, other medicines, foods, dyes, or preservatives -pregnant or trying to get pregnant -breast-feeding How should I use this medicine? Take this medicine by mouth. To reduce nausea, this medicine may be taken with food. Follow the directions on the prescription label. Take this medicine at the same time each day and in the order directed on the package. Do not take your medicine more often than directed. A patient package insert for the product will be  given with each prescription and refill. Read this sheet carefully each time. The sheet may change frequently. Contact your pediatrician regarding the use of this medicine in children. Special care may be needed. This medicine has been used in female children who have started having menstrual periods. Overdosage: If you think you have taken too much of this medicine contact a poison control center or emergency room at once. NOTE: This medicine is only for you. Do not share this medicine with others. What if I miss a dose? If you miss a dose, refer to the patient information sheet you received with your medicine for direction. If you miss more than one pill, this medicine may not be as effective and you may need to use another form of birth control. What may interact with this medicine? Do not take this medicine with the following medication: -dasabuvir; ombitasvir; paritaprevir; ritonavir -ombitasvir; paritaprevir; ritonavir This medicine may also interact with the following medications: -acetaminophen -antibiotics or medicines for infections, especially rifampin, rifabutin, rifapentine, and griseofulvin, and possibly penicillins or tetracyclines -aprepitant -ascorbic acid (vitamin C) -atorvastatin -barbiturate medicines, such as phenobarbital -bosentan -carbamazepine -caffeine -clofibrate -cyclosporine -dantrolene -doxercalciferol -felbamate -grapefruit juice -hydrocortisone -medicines for anxiety or sleeping problems, such as diazepam or temazepam -medicines for diabetes, including pioglitazone -mineral oil -modafinil -mycophenolate -nefazodone -oxcarbazepine -phenytoin -prednisolone -ritonavir or other medicines for HIV infection or AIDS -rosuvastatin -selegiline -soy isoflavones supplements -St. John's wort -tamoxifen or raloxifene -theophylline -thyroid hormones -topiramate -warfarin This list may not describe all possible interactions. Give your health care  provider a list of all the medicines, herbs, non-prescription drugs, or dietary supplements you use. Also tell them if you smoke, drink alcohol, or use illegal drugs. Some   items may interact with your medicine. What should I watch for while using this medicine? Visit your doctor or health care professional for regular checks on your progress. You will need a regular breast and pelvic exam and Pap smear while on this medicine. Use an additional method of contraception during the first cycle that you take these tablets. If you have any reason to think you are pregnant, stop taking this medicine right away and contact your doctor or health care professional. If you are taking this medicine for hormone related problems, it may take several cycles of use to see improvement in your condition. Smoking increases the risk of getting a blood clot or having a stroke while you are taking birth control pills, especially if you are more than 26 years old. You are strongly advised not to smoke. This medicine can make your body retain fluid, making your fingers, hands, or ankles swell. Your blood pressure can go up. Contact your doctor or health care professional if you feel you are retaining fluid. This medicine can make you more sensitive to the sun. Keep out of the sun. If you cannot avoid being in the sun, wear protective clothing and use sunscreen. Do not use sun lamps or tanning beds/booths. If you wear contact lenses and notice visual changes, or if the lenses begin to feel uncomfortable, consult your eye care specialist. In some women, tenderness, swelling, or minor bleeding of the gums may occur. Notify your dentist if this happens. Brushing and flossing your teeth regularly may help limit this. See your dentist regularly and inform your dentist of the medicines you are taking. If you are going to have elective surgery, you may need to stop taking this medicine before the surgery. Consult your health care  professional for advice. This medicine does not protect you against HIV infection (AIDS) or any other sexually transmitted diseases. What side effects may I notice from receiving this medicine? Side effects that you should report to your doctor or health care professional as soon as possible: -allergic reactions like skin rash, itching or hives, swelling of the face, lips, or tongue -breast tissue changes or discharge -changes in vaginal bleeding during your period or between your periods -changes in vision -chest pain -confusion -coughing up blood -dizziness -feeling faint or lightheaded -headaches or migraines -leg, arm or groin pain -loss of balance or coordination -severe or sudden headaches -stomach pain (severe) -sudden shortness of breath -sudden numbness or weakness of the face, arm or leg -symptoms of vaginal infection like itching, irritation or unusual discharge -tenderness in the upper abdomen -trouble speaking or understanding -vomiting -yellowing of the eyes or skin Side effects that usually do not require medical attention (report to your doctor or health care professional if they continue or are bothersome): -breakthrough bleeding and spotting that continues beyond the 3 initial cycles of pills -breast tenderness -mood changes, anxiety, depression, frustration, anger, or emotional outbursts -increased sensitivity to sun or ultraviolet light -nausea -skin rash, acne, or brown spots on the skin -weight gain (slight) This list may not describe all possible side effects. Call your doctor for medical advice about side effects. You may report side effects to FDA at 1-800-FDA-1088. Where should I keep my medicine? Keep out of the reach of children. Store at room temperature between 15 and 30 degrees C (59 and 86 degrees F). Throw away any unused medicine after the expiration date. NOTE: This sheet is a summary. It may not cover all possible information. If you   have  questions about this medicine, talk to your doctor, pharmacist, or health care provider.  2018 Elsevier/Gold Standard (2016-06-03 08:04:41) Dyspareunia, Female Dyspareunia is pain that is associated with sexual activity. This can affect any part of the genitals or lower abdomen, and there are many possible causes. This condition ranges from mild to severe. Depending on the cause, dyspareunia may get better with treatment, or it may return (recur) over time. What are the causes? The cause of this condition is not always known. Possible causes include:  Cancer.  Psychological factors, such as depression, anxiety, or previous traumatic experiences.  Severe pain and tenderness of the skin around the vagina (vulva) when it is touched (vulvar vestibulitis syndrome).  Infection of the pelvis or the vulva.  Infection of the vagina.  Painful, involuntary tightening (contraction) of the vaginal muscles when anything is put inside the vagina (vaginismus).  Allergic reaction.  Ovarian cysts.  Solid growths of tissue (tumors) in the ovaries or the uterus.  Scar tissue in the ovaries, vagina, or pelvis.  Vaginal dryness.  Thinning of the tissue (atrophy) of the vulva and vagina.  Skin conditions that affect the vulva (vulvar dermatoses), such as lichen sclerosus or lichen planus.  Endometriosis.  Tubal pregnancy.  A tilted uterus.  Uterine prolapse.  Adhesions in the vagina.  Bladder problems.  Intestinal problems.  Certain medicines.  Medical conditions such as diabetes, arthritis, or thyroid disease.  What increases the risk? The following factors may make you more likely to develop this condition:  Having experienced physical or sexual trauma.  Having given birth more than once.  Taking birth control pills.  Having gone through menopause.  Having recently given birth, typically within the past 3-6 months.  Breastfeeding.  What are the signs or symptoms? The  main symptom of this condition is pain in any part of the genitals or lower abdomen during or after sexual activity. This may include pain during sexual arousal, genital stimulation, or orgasm. Pain may get worse when anything is inserted into the vagina, or when the genitals are touched in any way, such as when sitting or wearing pants. Pain can range from mild to severe, depending on the cause of the condition. In some cases, symptoms go away with treatment and return (recur) at a later date. How is this diagnosed? This condition may be diagnosed based on:  Your symptoms, including: ? Where your pain is located. ? When your pain occurs.  Your medical history.  A physical exam. This may include a pelvic exam and a Pap test. This is a screening test that is used to check for signs of cancer of the vagina, cervix, and uterus.  Tests, including: ? Blood tests. ? Ultrasound. This uses sound waves to make a picture of the area that is being tested. ? Urine culture. This test involves checking a urine sample for signs of infection. ? Culture test. This is when your health care provider uses a swab to collect a sample of vaginal fluid. The sample is checked for signs of infection. ? X-rays. ? MRI. ? CT scan. ? Laparoscopy. This is a procedure in which a small incision is made in your lower abdomen and a lighted, pencil-sized instrument (laparoscope) is passed through the incision and used to look inside your pelvis.  You may be referred to a health care provider who specializes in women's health (gynecologist). In some cases, diagnosing the cause of dyspareunia can be difficult. How is this treated? Treatment depends on  the cause of your condition and your symptoms. In most cases, you may need to stop sexual activity until your symptoms improve. Treatment may include:  Lubricants.  Kegel exercises or vaginal dilators.  Medicated skin creams.  Medicated vaginal creams.  Hormonal  therapy.  Antibiotic medicine to prevent or fight infection.  Medicines that help to relieve pain.  Medicines that treat depression (antidepressants).  Psychological counseling.  Sex therapy.  Surgery.  Follow these instructions at home: Lifestyle  Avoid tight clothing and irritating materials around your genital and abdominal area.  Use water-based lubricants as needed. Avoid oil-based lubricants.  Do not use any products that irritate you. This may include certain condoms, spermicides, lubricants, soaps, tampons, vaginal sprays, or douches.  Always practice safe sex. Talk with your health care provider about which form of birth control (contraception) is best for you.  Maintain open communication with your sexual partner. General instructions  Take over-the-counter and prescription medicines only as told by your health care provider.  If you had tests done, it is your responsibility to get your tests results. Ask your health care provider or the department performing the test when your results will be ready.  Urinate before you engage in sexual activity.  Consider joining a support group.  Keep all follow-up visits as told by your health care provider. This is important. Contact a health care provider if:  You develop vaginal bleeding after sexual intercourse.  You develop a lump at the opening of your vagina. Seek medical care even if the lump is painless.  You have: ? Abnormal vaginal discharge. ? Vaginal dryness. ? Itchiness or irritation of your vulva or vagina. ? A new rash. ? Symptoms that get worse or do not improve with treatment. ? A fever. ? Pain when you urinate. ? Blood in your urine. Get help right away if:  You develop severe pain in your abdomen during or shortly after sexual intercourse.  You pass out after having sexual intercourse. This information is not intended to replace advice given to you by your health care provider. Make sure you  discuss any questions you have with your health care provider. Document Released: 10/13/2007 Document Revised: 02/02/2016 Document Reviewed: 04/25/2015 Elsevier Interactive Patient Education  2018 [employer] 18-39 Years, Female Preventive care refers to lifestyle choices and visits with your health care provider that can promote health and wellness. What does preventive care include?  A yearly physical exam. This is also called an annual well check.  Dental exams once or twice a year.  Routine eye exams. Ask your health care provider how often you should have your eyes checked.  Personal lifestyle choices, including: ? Daily care of your teeth and gums. ? Regular physical activity. ? Eating a healthy diet. ? Avoiding tobacco and drug use. ? Limiting alcohol use. ? Practicing safe sex. ? Taking vitamin and mineral supplements as recommended by your health care provider. What happens during an annual well check? The services and screenings done by your health care provider during your annual well check will depend on your age, overall health, lifestyle risk factors, and family history of disease. Counseling Your health care provider may ask you questions about your:  Alcohol use.  Tobacco use.  Drug use.  Emotional well-being.  Home and relationship well-being.  Sexual activity.  Eating habits.  Work and work Statistician.  Method of birth control.  Menstrual cycle.  Pregnancy history.  Screening You may have the following tests or measurements:  Height, weight, and BMI.  Diabetes screening. This is done by checking your blood sugar (glucose) after you have not eaten for a while (fasting).  Blood pressure.  Lipid and cholesterol levels. These may be checked every 5 years starting at age 16.  Skin check.  Hepatitis C blood test.  Hepatitis B blood test.  Sexually transmitted disease (STD) testing.  BRCA-related cancer screening. This  may be done if you have a family history of breast, ovarian, tubal, or peritoneal cancers.  Pelvic exam and Pap test. This may be done every 3 years starting at age 45. Starting at age 69, this may be done every 5 years if you have a Pap test in combination with an HPV test.  Discuss your test results, treatment options, and if necessary, the need for more tests with your health care provider. Vaccines Your health care provider may recommend certain vaccines, such as:  Influenza vaccine. This is recommended every year.  Tetanus, diphtheria, and acellular pertussis (Tdap, Td) vaccine. You may need a Td booster every 10 years.  Varicella vaccine. You may need this if you have not been vaccinated.  HPV vaccine. If you are 60 or younger, you may need three doses over 6 months.  Measles, mumps, and rubella (MMR) vaccine. You may need at least one dose of MMR. You may also need a second dose.  Pneumococcal 13-valent conjugate (PCV13) vaccine. You may need this if you have certain conditions and were not previously vaccinated.  Pneumococcal polysaccharide (PPSV23) vaccine. You may need one or two doses if you smoke cigarettes or if you have certain conditions.  Meningococcal vaccine. One dose is recommended if you are age 51-21 years and a first-year college student living in a residence hall, or if you have one of several medical conditions. You may also need additional booster doses.  Hepatitis A vaccine. You may need this if you have certain conditions or if you travel or work in places where you may be exposed to hepatitis A.  Hepatitis B vaccine. You may need this if you have certain conditions or if you travel or work in places where you may be exposed to hepatitis B.  Haemophilus influenzae type b (Hib) vaccine. You may need this if you have certain risk factors.  Talk to your health care provider about which screenings and vaccines you need and how often you need them. This  information is not intended to replace advice given to you by your health care provider. Make sure you discuss any questions you have with your health care provider. Document Released: 11/19/2001 Document Revised: 06/12/2016 Document Reviewed: 07/25/2015 Elsevier Interactive Patient Education  [employer].

## 2018-08-20 NOTE — Progress Notes (Signed)
Patient here for AE, c/o c-section incision numbness and pain with intercourse since delivery 12/2017.

## 2018-08-22 LAB — NUSWAB VAGINITIS (VG)
Candida albicans, NAA: NEGATIVE
Candida glabrata, NAA: NEGATIVE
Trich vag by NAA: NEGATIVE

## 2018-08-25 LAB — PAP IG, RFX HPV ASCU,16/18

## 2018-09-01 ENCOUNTER — Ambulatory Visit: Payer: Managed Care, Other (non HMO)

## 2018-09-17 ENCOUNTER — Ambulatory Visit: Payer: Managed Care, Other (non HMO) | Attending: Certified Nurse Midwife

## 2018-09-17 DIAGNOSIS — M6208 Separation of muscle (nontraumatic), other site: Secondary | ICD-10-CM

## 2018-09-17 DIAGNOSIS — M6281 Muscle weakness (generalized): Secondary | ICD-10-CM | POA: Diagnosis present

## 2018-09-17 DIAGNOSIS — M629 Disorder of muscle, unspecified: Secondary | ICD-10-CM | POA: Insufficient documentation

## 2018-09-17 DIAGNOSIS — M6289 Other specified disorders of muscle: Secondary | ICD-10-CM

## 2018-09-17 NOTE — Therapy (Signed)
[place] [facility] MAIN [facility] SERVICES [address] [place], [place], [address] Phone: [phone]   Fax:  [phone]  Physical Therapy Treatment  Patient Details  Name: Tara Baldwin MRN: [id number] Date of Birth: 1991/09/23 Referring Provider (PT): Gunnar Bulla   Encounter Date: 09/17/2018  PT End of Session - 09/17/18 1223    Visit Number  14    Number of Visits  17    Date for PT Re-Evaluation  10/06/18    Authorization Type  [employer]    Authorization - Visit Number  2    Authorization - Number of Visits  5    PT Start Time  1100    PT Stop Time  1200    PT Time Calculation (min)  60 min    Activity Tolerance  Patient tolerated treatment well    Behavior During Therapy  [facility] for tasks assessed/performed       Past Medical History:  Diagnosis Date  . Diastasis recti 08/20/2018  . Headache   . STD (female) 05/16/2017   chlmydia    Past Surgical History:  Procedure Laterality Date  . CESAREAN SECTION N/A 12/19/2017   Procedure: CESAREAN SECTION;  Surgeon: Herold Harms, MD;  Location: [facility] ORS;  Service: Obstetrics;  Laterality: N/A;  . NO PAST SURGERIES      There were no vitals filed for this visit.    Pelvic Floor Physical Therapy Treatment Note  SCREENING  Changes in medications, allergies, or medical history?:  no   SUBJECTIVE  Patient reports: Has been going through a lot mentally/emotionally and has had aa lot of ups and downs.   Pain update:  Location of pain: tail-bone and mid-thoracic Current pain:  3-4/10  Max pain:  6/10 Least pain:  0/10 Nature of pain: sharp  Patient Goals: TTP to B Obliques and QL, no longer has fascial restriction in lower abdomen.  OBJECTIVE  Changes in: Posture/Observations:  Continues to demonstrate slight/moderate anterior pelvic tilt and mild kyphosis greater through the upper thoracic region.  Range of Motion/Flexibilty:  Decreased mobility through B  sacral borders, greater restriction near apex.  Abdominal:  Pt. Demonstrates no separation below the umbilicus but continues to have 3 finger widths at most and narrows to two with activation of TA during lean-backs and knee planks.  INTERVENTIONS THIS SESSION: Self-care: Educated on postpartum depression and how it can develop later than people think. Discussed the potential of adding a low-dose anti-anxietal or depressant per her MD's recommendation to help her improve her mental state and compliance with her HEP to see further improvement in symptoms.  Manual: Performed PA mobs to B sacral borders and T2-6 to improve mobility and decrease tension on nerve roots for pain relief and improved function.  Therex: Reviewed glute strengthening, knee-planks, lean-backs, and self-mobilization with towel roll to improve performance at home and maximize efficacy.  Total time: 60 min.                          PT Education - 09/17/18 1221    Education Details  See Pt. instructions and Interventions this session.    Person(s) Educated  Patient    Methods  Explanation;Demonstration;Verbal cues;Tactile cues;Handout    Comprehension  Verbalized understanding       PT Short Term Goals - 07/28/18 0810      PT SHORT TERM GOAL #1   Title  Patient will demonstrate functional recruitment of TA with  breathing, sit-to-stand, squatting/lifting, and walking to allow for improved pelvic brace coordination, improved balance, and decreased intraabdominal pressure to allow for healing of diastasis recti.    Baseline  paradoxical contraction with laugh, cough, etc.    Time  6    Period  Weeks    Status  Achieved    Target Date  06/12/18      PT SHORT TERM GOAL #2   Title  Patient will demonstrate HEP x1 in the clinic to demonstrate understanding and proper form to allow for further improvement.    Time  6    Period  Weeks    Status  Achieved    Target Date  06/12/18      PT SHORT TERM  GOAL #3   Title  Pt. will demonstrate ability to actively maintain foce generation across diastasis with SLR to demonstrate readiness to increase exercise intensity for long-term goal attainment.    Time  6    Period  Weeks    Status  Achieved    Target Date  07/16/18        PT Long Term Goals - 07/28/18 4782      PT LONG TERM GOAL #1   Title  Pt. will demonstrate 2 finger or less separation of diastasis recti without activation, which narrows with active contraction to less than 1 finger to demonstrate functional force transmission to protect abdomen and spine during high-level activity.    Baseline  4 fingers at rest, 3 fingers at rest, was narrowing to 2 fingers actively but has had minor set-back and is inconsistent currently.     Time  12    Period  Weeks    Status  On-going    Target Date  10/06/18      PT LONG TERM GOAL #2   Title  Pt. Will demonstrate ability to return to dance for 30 min. with only light support from tape without demonstrating doming of abdomen.    Baseline  Doming with standing still, As of 10/18: is not doming in standing but inconsistently is either separating or narrowing with activation.    Time  12    Period  Weeks    Status  New    Target Date  10/06/18            Plan - 09/17/18 1223    Clinical Impression Statement  Pt. expressed that she is having a very hard time emotionally recently and she knows that this is impeding her ability to keep up with her exercises to continue improving. She has demonstrated a trend toward less compliance and also less improvement over the past three sessons and is showing signs of depression. Pt. admitted that she believes she is starting to slip into a depression even though she has been trying to deny it and agreed that she would like to seek help for this to help her with her overall health. She asked PT to contact her medical provider to help her with the next step of geetting care for this issue. We will  continue per POC.    Clinical Presentation  Stable    Clinical Decision Making  Moderate    Rehab Potential  Excellent    PT Frequency  1x / week    PT Duration  12 weeks    PT Treatment/Interventions  ADLs/Self Care Home Management;Biofeedback;Electrical Stimulation;Functional mobility training;Therapeutic activities;Therapeutic exercise;Neuromuscular re-education;Patient/family education;Passive range of motion;Scar mobilization;Manual techniques;Dry needling;Taping    PT Next Visit Plan  Re-assess and D/C continue reviewing HEP. MFR and TP release PRN to low back, hip-flexors, and lateral abdomen.    PT Home Exercise Plan  [email or web address] , soda-can, log-roll, hip-flexor stretch, multi-stepping "ha-ha" exercise for timing with TA for cough, laugh, active psoas stretch in kneeling, mod. Boat pose, knee plank, overhead squat-press, piriformis stretch.    Consulted and Agree with Plan of Care  Patient       Patient will benefit from skilled therapeutic intervention in order to improve the following deficits and impairments:  Increased fascial restricitons, Pain, Improper body mechanics, Decreased coordination, Decreased scar mobility, Increased muscle spasms, Decreased range of motion, Decreased strength, Hypomobility, Decreased balance, Impaired flexibility  Visit Diagnosis: Muscle weakness (generalized)  Diastasis recti  Muscular imbalance     Problem List Patient Active Problem List   Diagnosis Date Noted  . Diastasis recti 08/20/2018  . History of C-section 12/30/2017  . Left flank pain 12/14/2017  . Breast lump on right side at 11 o'clock position 10/09/2017    Cleophus MoltKeeli T Tashaya Ancrum 09/17/2018, 12:37 PM  [place] [facility] [address] [place], [place], [address] Phone: [phone]   Fax:  [phone]  Name: Placido SouBreanna Mathews MRN: [id number] Date of Birth: 03-Jul-1991

## 2018-09-17 NOTE — Patient Instructions (Signed)
Access Code: [email or web address]  URL: [email or web address]  Date: 09/17/2018  Prepared by: Flora LippsKeeli Gailes   Exercises  Seated Flexion Stretch - 3 reps - 5 Breaths hold - 1x daily - 7x weekly  Half Kneeling Hip Flexor Stretch - 3 reps - 5 Breaths hold - 1x daily - 7x weekly  TL Sidebending Stretch - Single Arm Overhead - 3 reps - 5 Breaths hold - 1x daily - 7x weekly  Step Up - 10 reps - 3 sets - 1x daily - 7x weekly  Half-Kneeling Trunk Rotation - 5 reps - 3 sets - 1x daily - 7x weekly  Mini Squat to Shoulder Press with Barbell - 10 reps - 3 sets - 1x daily - 7x weekly  Leg Swings Side to Side - 10 reps - 3 sets - 1x daily - 7x weekly  Thoracic Extension Mobilization on Foam Roll - 10 reps - 3 sets - 1x daily - 7x weekly  Step Up - 10 reps - 3 sets - 1x daily - 7x weekly

## 2018-09-18 ENCOUNTER — Other Ambulatory Visit (HOSPITAL_COMMUNITY)
Admission: RE | Admit: 2018-09-18 | Discharge: 2018-09-18 | Disposition: A | Discharge status: Home / Self Care | Payer: Managed Care, Other (non HMO) | Source: Ambulatory Visit | Attending: Obstetrics and Gynecology | Admitting: Obstetrics and Gynecology

## 2018-09-18 ENCOUNTER — Encounter: Payer: Self-pay | Admitting: Obstetrics and Gynecology

## 2018-09-18 ENCOUNTER — Ambulatory Visit (INDEPENDENT_AMBULATORY_CARE_PROVIDER_SITE_OTHER): Payer: Managed Care, Other (non HMO) | Admitting: Obstetrics and Gynecology

## 2018-09-18 VITALS — BP 128/74 | HR 65 | Ht 66.0 in | Wt 160.2 lb

## 2018-09-18 DIAGNOSIS — R87612 Low grade squamous intraepithelial lesion on cytologic smear of cervix (LGSIL): Secondary | ICD-10-CM

## 2018-09-18 NOTE — Progress Notes (Signed)
Pt is present today for colpo. Pt started her cycle this morning and is having light spotting. No other concerns.

## 2018-09-18 NOTE — Patient Instructions (Signed)
Colposcopy, Care After  This sheet gives you information about how to care for yourself after your procedure. Your doctor may also give you more specific instructions. If you have problems or questions, contact your doctor.  What can I expect after the procedure?  If you did not have a tissue sample removed (did not have a biopsy), you may only have some spotting for a few days. You can go back to your normal activities.  If you had a tissue sample removed, it is common to have:  · Soreness and pain. This may last for a few days.  · Light-headedness.  · Mild bleeding from your vagina or dark-colored, grainy discharge from your vagina. This may last for a few days. You may need to wear a sanitary pad.  · Spotting for at least 48 hours after the procedure.    Follow these instructions at home:  · Take over-the-counter and prescription medicines only as told by your doctor. Ask your doctor what medicines you can start taking again. This is very important if you take blood-thinning medicine.  · Do not drive or use heavy machinery while taking prescription pain medicine.  · For 3 days, or as long as your doctor tells you, avoid:  ? Douching.  ? Using tampons.  ? Having sex.  · If you use birth control (contraception), keep using it.  · Limit activity for the first day after the procedure. Ask your doctor what activities are safe for you.  · It is up to you to get the results of your procedure. Ask your doctor when your results will be ready.  · Keep all follow-up visits as told by your doctor. This is important.  Contact a doctor if:  · You get a skin rash.  Get help right away if:  · You are bleeding a lot from your vagina. It is a lot of bleeding if you are using more than one pad an hour for 2 hours in a row.  · You have clumps of blood (blood clots) coming from your vagina.  · You have a fever.  · You have chills  · You have pain in your lower belly (pelvic area).  · You have signs of infection, such as vaginal  discharge that is:  ? Different than usual.  ? Yellow.  ? Bad-smelling.  · You have very pain or cramps in your lower belly that do not get better with medicine.  · You feel light-headed.  · You feel dizzy.  · You pass out (faint).  Summary  · If you did not have a tissue sample removed (did not have a biopsy), you may only have some spotting for a few days. You can go back to your normal activities.  · If you had a tissue sample removed, it is common to have mild pain and spotting for 48 hours.  · For 3 days, or as long as your doctor tells you, avoid douching, using tampons and having sex.  · Get help right away if you have bleeding, very bad pain, or signs of infection.  This information is not intended to replace advice given to you by your health care provider. Make sure you discuss any questions you have with your health care provider.  Document Released: 03/11/2008 Document Revised: 06/12/2016 Document Reviewed: 06/12/2016  Elsevier Interactive Patient Education © 2018 [employer] Inc[employer]

## 2018-09-18 NOTE — Progress Notes (Signed)
     GYNECOLOGY CLINIC COLPOSCOPY PROCEDURE NOTE  27 y.o. G1P1001 here for colposcopy for low-grade squamous intraepithelial neoplasia (LGSIL - encompassing HPV,mild dysplasia,CIN I) pap smear on 08/20/2018. Discussed role for HPV in cervical dysplasia, need for surveillance.  Patient given informed consent, signed copy in the chart, time out was performed.  Placed in lithotomy position. Cervix viewed with speculum and colposcope after application of acetic acid.   Colposcopy adequate? Yes  no mosaicism, no punctation, no abnormal vasculature and HPV changes noted at 9 and 12 o'clock; corresponding biopsies obtained.  ECC specimen obtained. All specimens were labeled and sent to pathology.   Patient was given post procedure instructions.  Will follow up pathology and manage accordingly; patient will be contacted with results and recommendations.  Routine preventative health maintenance measures emphasized.    Hildred Laserherry, Emmitte Surgeon, MD [facility]

## 2018-09-29 ENCOUNTER — Ambulatory Visit: Payer: Managed Care, Other (non HMO)

## 2018-10-06 ENCOUNTER — Ambulatory Visit: Payer: Managed Care, Other (non HMO)

## 2018-10-06 DIAGNOSIS — M6281 Muscle weakness (generalized): Secondary | ICD-10-CM | POA: Diagnosis not present

## 2018-10-06 DIAGNOSIS — M629 Disorder of muscle, unspecified: Secondary | ICD-10-CM

## 2018-10-06 DIAGNOSIS — M6289 Other specified disorders of muscle: Secondary | ICD-10-CM

## 2018-10-06 DIAGNOSIS — M6208 Separation of muscle (nontraumatic), other site: Secondary | ICD-10-CM

## 2018-10-06 NOTE — Therapy (Addendum)
[place] MAIN [facility] SERVICES [address] [place], [place], [address] Phone: [phone]   Fax:  [phone]  Physical Therapy Treatment and Discharge Summary  Patient Details  Name: Tara Baldwin MRN: [id number] Date of Birth: 27-Sep-1991 Referring Provider (PT): Diona Fanti   Encounter Date: 10/06/2018    Past Medical History:  Diagnosis Date  . Diastasis recti 08/20/2018  . Headache   . STD (female) 05/16/2017   chlmydia    Past Surgical History:  Procedure Laterality Date  . CESAREAN SECTION N/A 12/19/2017   Procedure: CESAREAN SECTION;  Surgeon: Brayton Mars, MD;  Location: [facility] ORS;  Service: Obstetrics;  Laterality: N/A;  . NO PAST SURGERIES      There were no vitals filed for this visit.   Pelvic Floor Physical Therapy Treatment Note and Discharge Summary  SCREENING  Changes in medications, allergies, or medical history?: none    SUBJECTIVE  Patient reports: Has been running and working out with her friends at the gym and stretching a lot. Feeling a lot better physically but is going to talk to [place] about emotional difficulties. Is doing her stretches when she feels stiffness right away. Knows she is still not as consistent but is doing better.   Precautions:  postpartum  Pain update: none   Patient Goals: TTP to B Obliques and QL, no longer has fascial restriction in lower abdomen.   OBJECTIVE  Changes in: Posture/Observations:  Pt. Is appropriately engaging TA ~ 60% of the time with increases in abdominal pressure, has greater difficulty in anterior pelvic tilt.    Abdominal:  2-3 finger separation at greatest above the umbilicus.   Palpation: Decreased fascial mobility most restricted through lateral obliques near QL and hip external rotators.   INTERVENTIONS THIS SESSION: Manual: performed IASTM to B obliques, quads, and Glutes to allow for improved fascial mobility and  decreased restriction of rectus abdominus to allow for improved force closure of the rectus abdominus. Therex: reviewed and performed lean-backs and SLR to assess deep-core stability and closure of diastasis as well as strengthen. Reviewed HEP and how to continue to progress following D/C.   Total time: 60 min.                             PT Short Term Goals - 10/08/18 0816      PT SHORT TERM GOAL #1   Title  Patient will demonstrate functional recruitment of TA with breathing, sit-to-stand, squatting/lifting, and walking to allow for improved pelvic brace coordination, improved balance, and decreased intraabdominal pressure to allow for healing of diastasis recti.    Baseline  paradoxical contraction with laugh, cough, etc.    Time  6    Period  Weeks    Status  Achieved    Target Date  06/12/18      PT SHORT TERM GOAL #2   Title  Patient will demonstrate HEP x1 in the clinic to demonstrate understanding and proper form to allow for further improvement.    Time  6    Period  Weeks    Status  Achieved    Target Date  06/12/18      PT SHORT TERM GOAL #3   Title  Pt. will demonstrate ability to actively maintain foce generation across diastasis with SLR to demonstrate readiness to increase exercise intensity for long-term goal attainment.    Time  6    Period  Weeks    Status  Achieved    Target Date  07/16/18        PT Long Term Goals - 10/08/18 9518      PT LONG TERM GOAL #1   Title  Pt. will demonstrate 2 finger or less separation of diastasis recti without activation, which narrows with active contraction to less than 1 finger to demonstrate functional force transmission to protect abdomen and spine during high-level activity.    Baseline  4 fingers at rest, 3 fingers at rest, was narrowing to 2 fingers actively but has had minor set-back and is inconsistent currently.     Time  12    Period  Weeks    Status  Partially Met    Target Date  10/06/18       PT LONG TERM GOAL #2   Title  Pt. Will demonstrate ability to return to dance for 30 min. with only light support from tape without demonstrating doming of abdomen.    Baseline  Doming with standing still, As of 10/18: is not doming in standing but inconsistently is either separating or narrowing with activation.    Time  12    Period  Weeks    Status  Achieved    Target Date  10/06/18            Plan - 10/08/18 0817    Clinical Impression Statement  Pt. Has responded well to therapy overall, with minor setback due to late onset of potential postpartum depression but has taken steps to get help and demonstrates great improvement over the past two weeks since prior visit. Both PT and patient agree that she has the tools necessary to continue improving her core stability at home through her HEP and gradual increases in other exercise and she will be D/C at this time to her HEP to continue toward her last remaining goal of continuing to nerrow her diastasis from the 3 finger separation to <2 for greater functional ability. Pt. is able to achieve appropriate recruitment and coordination with focus to allow for improvement.     Clinical Presentation  Stable    Clinical Decision Making  Moderate    Rehab Potential  Excellent    PT Frequency  1x / week    PT Duration  12 weeks    PT Treatment/Interventions  ADLs/Self Care Home Management;Biofeedback;Electrical Stimulation;Functional mobility training;Therapeutic activities;Therapeutic exercise;Neuromuscular re-education;Patient/family education;Passive range of motion;Scar mobilization;Manual techniques;Dry needling;Taping    PT Next Visit Plan  Re-assess and D/C continue reviewing HEP. MFR and TP release PRN to low back, hip-flexors, and lateral abdomen.    PT Home Exercise Plan  [email or web address] , soda-can, log-roll, hip-flexor stretch, multi-stepping "ha-ha" exercise for timing with TA for cough, laugh, active psoas stretch in kneeling, mod. Boat  pose, knee plank, overhead squat-press, piriformis stretch.    Consulted and Agree with Plan of Care  Patient       Patient will benefit from skilled therapeutic intervention in order to improve the following deficits and impairments:  Increased fascial restricitons, Pain, Improper body mechanics, Decreased coordination, Decreased scar mobility, Increased muscle spasms, Decreased range of motion, Decreased strength, Hypomobility, Decreased balance, Impaired flexibility  Visit Diagnosis: Muscle weakness (generalized)  Diastasis recti  Muscular imbalance     Problem List Patient Active Problem List   Diagnosis Date Noted  . Diastasis recti 08/20/2018  . History of C-section 12/30/2017  . Left flank pain 12/14/2017  . Breast lump on right side at 11  o'clock position 10/09/2017   Willa Rough DPT, ATC Willa Rough 10/08/2018, 8:23 AM  [place] MAIN [facility] SERVICES [address] [place], [place], [address] Phone: [phone]   Fax:  [phone]  Name: Tara Baldwin MRN: [id number] Date of Birth: 04-Aug-1991

## 2019-03-09 ENCOUNTER — Encounter: Payer: Self-pay | Admitting: Certified Nurse Midwife

## 2019-08-11 ENCOUNTER — Other Ambulatory Visit: Payer: Self-pay | Admitting: *Deleted

## 2019-08-11 DIAGNOSIS — Z20822 Contact with and (suspected) exposure to covid-19: Secondary | ICD-10-CM

## 2019-08-12 LAB — NOVEL CORONAVIRUS, NAA: SARS-CoV-2, NAA: NOT DETECTED

## 2019-08-24 ENCOUNTER — Encounter: Payer: Managed Care, Other (non HMO) | Admitting: Certified Nurse Midwife

## 2019-08-26 ENCOUNTER — Encounter: Payer: Managed Care, Other (non HMO) | Admitting: Certified Nurse Midwife

## 2019-09-18 ENCOUNTER — Other Ambulatory Visit: Payer: Self-pay | Admitting: Certified Nurse Midwife

## 2019-09-21 ENCOUNTER — Other Ambulatory Visit: Payer: Self-pay | Admitting: Obstetrics and Gynecology

## 2019-11-23 ENCOUNTER — Ambulatory Visit: Payer: Managed Care, Other (non HMO) | Attending: Internal Medicine

## 2019-11-23 DIAGNOSIS — Z20822 Contact with and (suspected) exposure to covid-19: Secondary | ICD-10-CM

## 2019-11-24 LAB — NOVEL CORONAVIRUS, NAA: SARS-CoV-2, NAA: DETECTED — AB

## 2019-12-07 ENCOUNTER — Ambulatory Visit: Payer: Managed Care, Other (non HMO) | Attending: Internal Medicine

## 2019-12-07 DIAGNOSIS — Z20822 Contact with and (suspected) exposure to covid-19: Secondary | ICD-10-CM

## 2019-12-09 LAB — NOVEL CORONAVIRUS, NAA: SARS-CoV-2, NAA: NOT DETECTED

## 2019-12-13 ENCOUNTER — Other Ambulatory Visit: Payer: Self-pay

## 2019-12-13 ENCOUNTER — Ambulatory Visit (INDEPENDENT_AMBULATORY_CARE_PROVIDER_SITE_OTHER): Payer: Managed Care, Other (non HMO) | Admitting: Certified Nurse Midwife

## 2019-12-13 ENCOUNTER — Encounter: Payer: Self-pay | Admitting: Certified Nurse Midwife

## 2019-12-13 VITALS — BP 118/67 | HR 80 | Ht 66.0 in | Wt 151.2 lb

## 2019-12-13 DIAGNOSIS — N898 Other specified noninflammatory disorders of vagina: Secondary | ICD-10-CM | POA: Diagnosis not present

## 2019-12-13 DIAGNOSIS — Z01419 Encounter for gynecological examination (general) (routine) without abnormal findings: Secondary | ICD-10-CM

## 2019-12-13 DIAGNOSIS — Z98891 History of uterine scar from previous surgery: Secondary | ICD-10-CM

## 2019-12-13 DIAGNOSIS — Z124 Encounter for screening for malignant neoplasm of cervix: Secondary | ICD-10-CM

## 2019-12-13 DIAGNOSIS — R87612 Low grade squamous intraepithelial lesion on cytologic smear of cervix (LGSIL): Secondary | ICD-10-CM | POA: Diagnosis not present

## 2019-12-13 DIAGNOSIS — M6208 Separation of muscle (nontraumatic), other site: Secondary | ICD-10-CM

## 2019-12-13 MED ORDER — NORETHIN ACE-ETH ESTRAD-FE 1-20 MG-MCG PO TABS: 1.0000 | ORAL_TABLET | Freq: Every day | ORAL | 3 refills | Status: DC

## 2019-12-13 NOTE — Progress Notes (Signed)
Patient here for annual exam. No complaints.  

## 2019-12-13 NOTE — Patient Instructions (Signed)
Ethinyl Estradiol; Norethindrone Acetate; Ferrous fumarate tablets or capsules What is this medicine? ETHINYL ESTRADIOL; NORETHINDRONE ACETATE; FERROUS FUMARATE (ETH in il es tra DYE ole; nor eth IN drone AS e tate; FER [place] FUE ma rate) is an oral contraceptive. The products combine two types of female hormones, an estrogen and a progestin. They are used to prevent ovulation and pregnancy. Some products are also used to treat acne in females. This medicine may be used for other purposes; ask your health care provider or pharmacist if you have questions. COMMON BRAND NAME(S): Aurovela [address] 1/20, Aurovela Fe, [place] [address] Fe, Estrostep Fe, Gildess 24 Fe, Gildess Fe 1.5/30, Gildess Fe 1/20, Hailey 24 Fe, Hailey Fe 1.5/30, Junel Fe 1.5/30, Junel Fe 1/20, Junel Fe 24, Larin Fe, Lo Loestrin Fe, Loestrin 24 Fe, Loestrin FE 1.5/30, Loestrin FE 1/20, Lomedia 24 Fe, Microgestin 24 Fe, Microgestin Fe 1.5/30, Microgestin Fe 1/20, Tarina 24 Fe, Tarina Fe 1/20, Taytulla, Tilia Fe, Tri-Legest Fe What should I tell my health care provider before I take this medicine? They need to know if you have any of these conditions:  abnormal vaginal bleeding  blood vessel disease  breast, cervical, endometrial, ovarian, liver, or uterine cancer  diabetes  gallbladder disease  heart disease or recent heart attack  high blood pressure  high cholesterol  history of blood clots  kidney disease  liver disease  migraine headaches  smoke tobacco  stroke  systemic lupus erythematosus (SLE)  an unusual or allergic reaction to estrogens, progestins, other medicines, foods, dyes, or preservatives  pregnant or trying to get pregnant  breast-feeding How should I use this medicine? Take this medicine by mouth. To reduce nausea, this medicine may be taken with food. Follow the directions on the prescription label. Take this medicine at the same time each day and in the order directed on the package. Do  not take your medicine more often than directed. A patient package insert for the product will be given with each prescription and refill. Read this sheet carefully each time. The sheet may change frequently. Contact your pediatrician regarding the use of this medicine in children. Special care may be needed. This medicine has been used in female children who have started having menstrual periods. Overdosage: If you think you have taken too much of this medicine contact a poison control center or emergency room at once. NOTE: This medicine is only for you. Do not share this medicine with others. What if I miss a dose? If you miss a dose, refer to the patient information sheet you received with your medicine for direction. If you miss more than one pill, this medicine may not be as effective and you may need to use another form of birth control. What may interact with this medicine? Do not take this medicine with the following medication:  dasabuvir; ombitasvir; paritaprevir; ritonavir  ombitasvir; paritaprevir; ritonavir This medicine may also interact with the following medications:  acetaminophen  antibiotics or medicines for infections, especially rifampin, rifabutin, rifapentine, and griseofulvin, and possibly penicillins or tetracyclines  aprepitant  ascorbic acid (vitamin C)  atorvastatin  barbiturate medicines, such as phenobarbital  bosentan  carbamazepine  caffeine  clofibrate  cyclosporine  dantrolene  doxercalciferol  felbamate  grapefruit juice  hydrocortisone  medicines for anxiety or sleeping problems, such as diazepam or temazepam  medicines for diabetes, including pioglitazone  mineral oil  modafinil  mycophenolate  nefazodone  oxcarbazepine  phenytoin  prednisolone  ritonavir or other medicines for  HIV infection or AIDS  rosuvastatin  selegiline  soy isoflavones supplements  St. John's wort  tamoxifen or  raloxifene  theophylline  thyroid hormones  topiramate  warfarin This list may not describe all possible interactions. Give your health care provider a list of all the medicines, herbs, non-prescription drugs, or dietary supplements you use. Also tell them if you smoke, drink alcohol, or use illegal drugs. Some items may interact with your medicine. What should I watch for while using this medicine? Visit your doctor or health care professional for regular checks on your progress. You will need a regular breast and pelvic exam and Pap smear while on this medicine. Use an additional method of contraception during the first cycle that you take these tablets. If you have any reason to think you are pregnant, stop taking this medicine right away and contact your doctor or health care professional. If you are taking this medicine for hormone related problems, it may take several cycles of use to see improvement in your condition. Smoking increases the risk of getting a blood clot or having a stroke while you are taking birth control pills, especially if you are more than 28 years old. You are strongly advised not to smoke. This medicine can make your body retain fluid, making your fingers, hands, or ankles swell. Your blood pressure can go up. Contact your doctor or health care professional if you feel you are retaining fluid. This medicine can make you more sensitive to the sun. Keep out of the sun. If you cannot avoid being in the sun, wear protective clothing and use sunscreen. Do not use sun lamps or tanning beds/booths. If you wear contact lenses and notice visual changes, or if the lenses begin to feel uncomfortable, consult your eye care specialist. In some women, tenderness, swelling, or minor bleeding of the gums may occur. Notify your dentist if this happens. Brushing and flossing your teeth regularly may help limit this. See your dentist regularly and inform your dentist of the medicines you  are taking. If you are going to have elective surgery, you may need to stop taking this medicine before the surgery. Consult your health care professional for advice. This medicine does not protect you against HIV infection (AIDS) or any other sexually transmitted diseases. What side effects may I notice from receiving this medicine? Side effects that you should report to your doctor or health care professional as soon as possible:  allergic reactions like skin rash, itching or hives, swelling of the face, lips, or tongue  breast tissue changes or discharge  changes in vaginal bleeding during your period or between your periods  changes in vision  chest pain  confusion  coughing up blood  dizziness  feeling faint or lightheaded  headaches or migraines  leg, arm or groin pain  loss of balance or coordination  severe or sudden headaches  stomach pain (severe)  sudden shortness of breath  sudden numbness or weakness of the face, arm or leg  symptoms of vaginal infection like itching, irritation or unusual discharge  tenderness in the upper abdomen  trouble speaking or understanding  vomiting  yellowing of the eyes or skin Side effects that usually do not require medical attention (report to your doctor or health care professional if they continue or are bothersome):  breakthrough bleeding and spotting that continues beyond the 3 initial cycles of pills  breast tenderness  mood changes, anxiety, depression, frustration, anger, or emotional outbursts  increased sensitivity to sun  or ultraviolet light  nausea  skin rash, acne, or brown spots on the skin  weight gain (slight) This list may not describe all possible side effects. Call your doctor for medical advice about side effects. You may report side effects to FDA at 1-800-FDA-1088. Where should I keep my medicine? Keep out of the reach of children. Store at room temperature between 15 and 30 degrees C  (59 and 86 degrees F). Throw away any unused medicine after the expiration date. NOTE: This sheet is a summary. It may not cover all possible information. If you have questions about this medicine, talk to your doctor, pharmacist, or health care provider.  2020 Elsevier/Gold Standard (2016-06-03 08:04:41) Pap Test Why am I having this test? A Pap test, also called a Pap smear, is a screening test to check for signs of:  Cancer of the vagina, cervix, and uterus. The cervix is the lower part of the uterus that opens into the vagina.  Infection.  Changes that may be a sign that cancer is developing (precancerous changes). Women need this test on a regular basis. In general, you should have a Pap test every 3 years until you reach menopause or age 35. Women aged 30-60 may choose to have their Pap test done at the same time as an HPV (human papillomavirus) test every 5 years (instead of every 3 years). Your health care provider may recommend having Pap tests more or less often depending on your medical conditions and past Pap test results. What kind of sample is taken?  Your health care provider will collect a sample of cells from the surface of your cervix. This will be done using a small cotton swab, plastic spatula, or brush. This sample is often collected during a pelvic exam, when you are lying on your back on an exam table with feet in footrests (stirrups). In some cases, fluids (secretions) from the cervix or vagina may also be collected. How do I prepare for this test?  Be aware of where you are in your menstrual cycle. If you are menstruating on the day of the test, you may be asked to reschedule.  You may need to reschedule if you have a known vaginal infection on the day of the test.  Follow instructions from your health care provider about: ? Changing or stopping your regular medicines. Some medicines can cause abnormal test results, such as digitalis and tetracycline. ? Avoiding  douching or taking a bath the day before or the day of the test. Tell a health care provider about:  Any allergies you have.  All medicines you are taking, including vitamins, herbs, eye drops, creams, and over-the-counter medicines.  Any blood disorders you have.  Any surgeries you have had.  Any medical conditions you have.  Whether you are pregnant or may be pregnant. How are the results reported? Your test results will be reported as either abnormal or normal. A false-positive result can occur. A false positive is incorrect because it means that a condition is present when it is not. A false-negative result can occur. A false negative is incorrect because it means that a condition is not present when it is. What do the results mean? A normal test result means that you do not have signs of cancer of the vagina, cervix, or uterus. An abnormal result may mean that you have:  Cancer. A Pap test by itself is not enough to diagnose cancer. You will have more tests done in this case.  Precancerous changes in your vagina, cervix, or uterus.  Inflammation of the cervix.  An STD (sexually transmitted disease).  A fungal infection.  A parasite infection. Talk with your health care provider about what your results mean. Questions to ask your health care provider Ask your health care provider, or the department that is doing the test:  When will my results be ready?  How will I get my results?  What are my treatment options?  What other tests do I need?  What are my next steps? Summary  In general, women should have a Pap test every 3 years until they reach menopause or age 62.  Your health care provider will collect a sample of cells from the surface of your cervix. This will be done using a small cotton swab, plastic spatula, or brush.  In some cases, fluids (secretions) from the cervix or vagina may also be collected. This information is not intended to replace advice  given to you by your health care provider. Make sure you discuss any questions you have with your health care provider. Document Revised: 06/02/2017 Document Reviewed: 06/02/2017 Elsevier Patient Education  2020 [place] 23-51 Years Old, Female Preventive care refers to visits with your health care provider and lifestyle choices that can promote health and wellness. This includes:  A yearly physical exam. This may also be called an annual well check.  Regular dental visits and eye exams.  Immunizations.  Screening for certain conditions.  Healthy lifestyle choices, such as eating a healthy diet, getting regular exercise, not using drugs or products that contain nicotine and tobacco, and limiting alcohol use. What can I expect for my preventive care visit? Physical exam Your health care provider will check your:  Height and weight. This may be used to calculate body mass index (BMI), which tells if you are at a healthy weight.  Heart rate and blood pressure.  Skin for abnormal spots. Counseling Your health care provider may ask you questions about your:  Alcohol, tobacco, and drug use.  Emotional well-being.  Home and relationship well-being.  Sexual activity.  Eating habits.  Work and work Statistician.  Method of birth control.  Menstrual cycle.  Pregnancy history. What immunizations do I need?  Influenza (flu) vaccine  This is recommended every year. Tetanus, diphtheria, and pertussis (Tdap) vaccine  You may need a Td booster every 10 years. Varicella (chickenpox) vaccine  You may need this if you have not been vaccinated. Human papillomavirus (HPV) vaccine  If recommended by your health care provider, you may need three doses over 6 months. Measles, mumps, and rubella (MMR) vaccine  You may need at least one dose of MMR. You may also need a second dose. Meningococcal conjugate (MenACWY) vaccine  One dose is recommended if you are  age 28-21 years and a first-year college student living in a residence hall, or if you have one of several medical conditions. You may also need additional booster doses. Pneumococcal conjugate (PCV13) vaccine  You may need this if you have certain conditions and were not previously vaccinated. Pneumococcal polysaccharide (PPSV23) vaccine  You may need one or two doses if you smoke cigarettes or if you have certain conditions. Hepatitis A vaccine  You may need this if you have certain conditions or if you travel or work in places where you may be exposed to hepatitis A. Hepatitis B vaccine  You may need this if you have certain conditions or if you travel or work in  places where you may be exposed to hepatitis B. Haemophilus influenzae type b (Hib) vaccine  You may need this if you have certain conditions. You may receive vaccines as individual doses or as more than one vaccine together in one shot (combination vaccines). Talk with your health care provider about the risks and benefits of combination vaccines. What tests do I need?  Blood tests  Lipid and cholesterol levels. These may be checked every 5 years starting at age 76.  Hepatitis C test.  Hepatitis B test. Screening  Diabetes screening. This is done by checking your blood sugar (glucose) after you have not eaten for a while (fasting).  Sexually transmitted disease (STD) testing.  BRCA-related cancer screening. This may be done if you have a family history of breast, ovarian, tubal, or peritoneal cancers.  Pelvic exam and Pap test. This may be done every 3 years starting at age 73. Starting at age 68, this may be done every 5 years if you have a Pap test in combination with an HPV test. Talk with your health care provider about your test results, treatment options, and if necessary, the need for more tests. Follow these instructions at home: Eating and drinking   Eat a diet that includes fresh fruits and vegetables,  whole grains, lean protein, and low-fat dairy.  Take vitamin and mineral supplements as recommended by your health care provider.  Do not drink alcohol if: ? Your health care provider tells you not to drink. ? You are pregnant, may be pregnant, or are planning to become pregnant.  If you drink alcohol: ? Limit how much you have to 0-1 drink a day. ? Be aware of how much alcohol is in your drink. In the [place] one drink equals one 12 oz bottle of beer (355 mL), one 5 oz glass of wine (148 mL), or one 1 oz glass of hard liquor (44 mL). Lifestyle  Take daily care of your teeth and gums.  Stay active. Exercise for at least 30 minutes on 5 or more days each week.  Do not use any products that contain nicotine or tobacco, such as cigarettes, e-cigarettes, and chewing tobacco. If you need help quitting, ask your health care provider.  If you are sexually active, practice safe sex. Use a condom or other form of birth control (contraception) in order to prevent pregnancy and STIs (sexually transmitted infections). If you plan to become pregnant, see your health care provider for a preconception visit. What's next?  Visit your health care provider once a year for a well check visit.  Ask your health care provider how often you should have your eyes and teeth checked.  Stay up to date on all vaccines. This information is not intended to replace advice given to you by your health care provider. Make sure you discuss any questions you have with your health care provider. Document Revised: 06/04/2018 Document Reviewed: 06/04/2018 Elsevier Patient Education  2020 [employer].

## 2019-12-13 NOTE — Progress Notes (Signed)
ANNUAL PREVENTATIVE CARE GYN  ENCOUNTER NOTE  Subjective:       Tara Baldwin is a 29 y.o. G68P1001 female here for a routine annual gynecologic exam.  Current complaints: 1. Vaginal discharge 2. Needs Pap smear  Denies difficulty breathing or respiratory distress, chest pain, abdominal pain, excessive vaginal bleeding, dysuria, and leg pain or swelling.    Gynecologic History  Patient's last menstrual period was 11/15/2019 (approximate). Period Cycle (Days): 28 Period Duration (Days): 5 Period Pattern: Regular Menstrual Flow: Light Menstrual Control: Thin pad Dysmenorrhea: None  Contraception: OCP (estrogen/progesterone), Loestrin Fe  Last Pap: 08/2018. Results were: abnormal, LSIL; normal colposcopy 09/2018  Obstetric History  OB History  Gravida Para Term Preterm AB Living  1 1 1     1   SAB TAB Ectopic Multiple Live Births        0 1    # Outcome Date GA Lbr Len/2nd Weight Sex Delivery Anes PTL Lv  1 Term 12/19/17 [redacted]w[redacted]d  7 lb 0.9 oz (3.2 kg) M CS-LTranv EPI  LIV    Past Medical History:  Diagnosis Date  . Diastasis recti 08/20/2018  . Headache   . STD (female) 05/16/2017   chlmydia    Past Surgical History:  Procedure Laterality Date  . CESAREAN SECTION N/A 12/19/2017   Procedure: CESAREAN SECTION;  Surgeon: 12/21/2017, MD;  Location: [facility] ORS;  Service: Obstetrics;  Laterality: N/A;  . NO PAST SURGERIES      No Known Allergies  Social History   Socioeconomic History  . Marital status: Single    Spouse name: Not on file  . Number of children: Not on file  . Years of education: Not on file  . Highest education level: Not on file  Occupational History  . Not on file  Tobacco Use  . Smoking status: Never Smoker  . Smokeless tobacco: Never Used  Substance and Sexual Activity  . Alcohol use: Yes    Comment: occasional   . Drug use: No  . Sexual activity: Yes    Birth control/protection: Pill  Other Topics Concern  . Not on file  Social  History Narrative  . Not on file   Social Determinants of Health   Financial Resource Strain:   . Difficulty of Paying Living Expenses: Not on file  Food Insecurity:   . Worried About [place] in the Last Year: Not on file  . Ran Out of Food in the Last Year: Not on file  Transportation Needs:   . Lack of Transportation (Medical): Not on file  . Lack of Transportation (Non-Medical): Not on file  Physical Activity:   . Days of Exercise per Week: Not on file  . Minutes of Exercise per Session: Not on file  Stress:   . Feeling of Stress : Not on file  Social Connections:   . Frequency of Communication with Friends and Family: Not on file  . Frequency of Social Gatherings with Friends and Family: Not on file  . Attends Religious Services: Not on file  . Active Member of Clubs or Organizations: Not on file  . Attends Programme researcher, broadcasting/film/video Meetings: Not on file  . Marital Status: Not on file  Intimate Partner Violence:   . Fear of Current or Ex-Partner: Not on file  . Emotionally Abused: Not on file  . Physically Abused: Not on file  . Sexually Abused: Not on file    Family History  Problem Relation Age of Onset  .  Cancer Paternal Grandmother        Breast  . Breast cancer Paternal Grandmother   . Hypertension Father   . Ovarian cancer Neg Hx   . Colon cancer Neg Hx     The following portions of the patient's history were reviewed and updated as appropriate: allergies, current medications, past family history, past medical history, past social history, past surgical history and problem list.  Review of Systems  ROS negative except as noted above. Information obtained from patient.    Objective:   BP 118/67   Pulse 80   Ht 5\' 6"  (1.676 m)   Wt 151 lb 3.2 oz (68.6 kg)   LMP 11/15/2019 (Approximate)   Breastfeeding No   BMI 24.40 kg/m    CONSTITUTIONAL: Well-developed, well-nourished female in no acute distress.   PSYCHIATRIC: Normal mood and affect.  Normal behavior. Normal judgment and thought content.  [place]: Alert and oriented to person, place, and time. Normal muscle tone coordination. No cranial nerve deficit noted.  HENT:  Normocephalic, atraumatic, External right and left ear normal.   EYES: Conjunctivae and EOM are normal. Pupils are equal and round.   NECK: Normal range of motion, supple, no masses.  Normal thyroid.   SKIN: Skin is warm and dry. No rash noted. Not diaphoretic. No erythema. No pallor.  CARDIOVASCULAR: Normal heart rate noted, regular rhythm, no murmur.  RESPIRATORY: Clear to auscultation bilaterally. Effort and breath sounds normal, no problems with respiration noted.  BREASTS: Symmetric in size. No masses, skin changes, nipple drainage, or lymphadenopathy.  ABDOMEN: Soft, normal bowel sounds, no distention noted.  No tenderness, rebound or guarding. Two finger diastiasis recti present.   PELVIC:  External Genitalia: Normal  Vagina: Normal  Cervix: Normal, Pap and vaginal swab collected  Uterus: Normal  Adnexa: Normal   MUSCULOSKELETAL: Normal range of motion. No tenderness.  No cyanosis, clubbing, or edema.  2+ distal pulses.  LYMPHATIC: No Axillary, Supraclavicular, or Inguinal Adenopathy.  Assessment:   Annual gynecologic examination 29 y.o.   Contraception: OCP (estrogen/progesterone), Loestrin   Normal BMI   Problem List Items Addressed This Visit      Musculoskeletal and Integument   Diastasis recti     Other   History of C-section    Other Visit Diagnoses    Well woman exam    -  Primary   Relevant Orders   Pap IG, rfx HPV ASCU,16/18   NuSwab Vaginitis (VG)   LGSIL on Pap smear of cervix       Relevant Orders   Pap IG, rfx HPV ASCU,16/18   Screening for cervical cancer       Relevant Orders   Pap IG, rfx HPV ASCU,16/18   Vaginal discharge       Relevant Orders   NuSwab Vaginitis (VG)      Plan:   Pap: Pap, Reflex if ASCUS  Labs: See orders   Routine  preventative health maintenance measures emphasized: Exercise/Diet/Weight control, Tobacco Warnings, Alcohol/Substance use risks and Stress Management; see AVS  Rx Loestrin, see orders  Reviewed red flag symptoms and when to call  Return to [place] or sooner if needed   Diona Fanti, CNM [facility], [facility] 12/13/19 12:38 PM

## 2019-12-16 LAB — NUSWAB VAGINITIS (VG)
Candida albicans, NAA: NEGATIVE
Candida glabrata, NAA: NEGATIVE
Trich vag by NAA: NEGATIVE

## 2019-12-21 LAB — IGP, APTIMA HPV, RFX 16/18,45: HPV Aptima: NEGATIVE

## 2020-01-06 ENCOUNTER — Other Ambulatory Visit: Payer: Self-pay | Admitting: Obstetrics and Gynecology

## 2020-03-16 ENCOUNTER — Inpatient Hospital Stay
Admission: RE | Admit: 2020-03-16 | Discharge: 2020-03-16 | Disposition: A | Discharge status: Home / Self Care | Payer: Managed Care, Other (non HMO) | Source: Ambulatory Visit

## 2020-03-17 ENCOUNTER — Ambulatory Visit: Payer: Managed Care, Other (non HMO)

## 2020-04-21 ENCOUNTER — Emergency Department: Payer: Managed Care, Other (non HMO)

## 2020-04-21 ENCOUNTER — Emergency Department
Admission: EM | Admit: 2020-04-21 | Discharge: 2020-04-21 | Disposition: A | Discharge status: Home / Self Care | Payer: Managed Care, Other (non HMO) | Attending: Emergency Medicine | Admitting: Emergency Medicine

## 2020-04-21 ENCOUNTER — Other Ambulatory Visit: Payer: Self-pay

## 2020-04-21 DIAGNOSIS — S0990XA Unspecified injury of head, initial encounter: Secondary | ICD-10-CM | POA: Insufficient documentation

## 2020-04-21 DIAGNOSIS — W500XXA Accidental hit or strike by another person, initial encounter: Secondary | ICD-10-CM | POA: Insufficient documentation

## 2020-04-21 DIAGNOSIS — Y9389 Activity, other specified: Secondary | ICD-10-CM | POA: Diagnosis not present

## 2020-04-21 DIAGNOSIS — Y999 Unspecified external cause status: Secondary | ICD-10-CM | POA: Diagnosis not present

## 2020-04-21 DIAGNOSIS — S060X0A Concussion without loss of consciousness, initial encounter: Secondary | ICD-10-CM | POA: Diagnosis not present

## 2020-04-21 DIAGNOSIS — Y9289 Other specified places as the place of occurrence of the external cause: Secondary | ICD-10-CM | POA: Diagnosis not present

## 2020-04-21 MED ORDER — IBUPROFEN 600 MG PO TABS
600.0000 mg | ORAL_TABLET | Freq: Four times a day (QID) | ORAL | 0 refills | Status: DC | PRN
Start: 2020-04-21 — End: 2021-06-06

## 2020-04-21 MED ORDER — IBUPROFEN 600 MG PO TABS
600.0000 mg | ORAL_TABLET | Freq: Once | ORAL | Status: AC
  Administered 2020-04-21: 600 mg via ORAL
  Filled 2020-04-21: qty 1

## 2020-04-21 NOTE — ED Notes (Signed)
See triage note  States she was playing with her sone  Hit her head on the wall  This happened couple of days ago  conts to have h/a

## 2020-04-21 NOTE — ED Triage Notes (Signed)
Pt comes via POV from home with c/o injury that occurred on Monday. Pt states she hit her head on the wall. Pt states pain to frontal area. Pt states pain keeps fluctuating.

## 2020-04-21 NOTE — ED Provider Notes (Signed)
[facility] Emergency Department Provider Note  ____________________________________________  Time seen: Approximately 5:19 PM  I have reviewed the triage vital signs and the nursing notes.   HISTORY  Chief Complaint head injury    HPI Tara Baldwin is a 29 y.o. female that presents to the emergency department for evaluation of head injury.  Patient was playing with her son when she hit the top of her head on the side of the wall.  She did not lose consciousness.  She continues to have intermittent headaches that have become more constant.  Last night she had nausea.  She has also had some intermittent dizziness.  She has been taking Tylenol.  She is not on any blood thinners.  No vomiting.   Past Medical History:  Diagnosis Date  . Diastasis recti 08/20/2018  . Headache   . STD (female) 05/16/2017   chlmydia    Patient Active Problem List   Diagnosis Date Noted  . Diastasis recti 08/20/2018  . History of C-section 12/30/2017  . Left flank pain 12/14/2017  . Breast lump on right side at 11 o'clock position 10/09/2017    Past Surgical History:  Procedure Laterality Date  . CESAREAN SECTION N/A 12/19/2017   Procedure: CESAREAN SECTION;  Surgeon: Herold Harms, MD;  Location: [facility] ORS;  Service: Obstetrics;  Laterality: N/A;  . NO PAST SURGERIES      Prior to Admission medications   Medication Sig Start Date End Date Taking? Authorizing Provider  ibuprofen (ADVIL) 600 MG tablet Take 1 tablet (600 mg total) by mouth every 6 (six) hours as needed. 04/21/20   Enid Derry, PA-C  norethindrone-ethinyl estradiol (AUROVELA FE 1/20) 1-20 MG-MCG tablet Take 1 tablet by mouth daily. 12/13/19   Gunnar Bulla, CNM    Allergies Patient has no known allergies.  Family History  Problem Relation Age of Onset  . Cancer Paternal Grandmother        Breast  . Breast cancer Paternal Grandmother   . Hypertension Father   . Ovarian cancer Neg  Hx   . Colon cancer Neg Hx     Social History Social History   Tobacco Use  . Smoking status: Never Smoker  . Smokeless tobacco: Never Used  Vaping Use  . Vaping Use: Never used  Substance Use Topics  . Alcohol use: Yes    Comment: occasional   . Drug use: No     Review of Systems  Constitutional: No fever/chills Cardiovascular: No chest pain. Respiratory: No cough. No SOB. Gastrointestinal: No abdominal pain.  No vomiting.  Positive for nausea. Musculoskeletal: Negative for musculoskeletal pain. Skin: Negative for rash, abrasions, lacerations, ecchymosis. Neurological: Negative for numbness or tingling.  Positive for headache.   ____________________________________________   PHYSICAL EXAM:  VITAL SIGNS: ED Triage Vitals [04/21/20 1553]  Enc Vitals Group     BP 124/80     Pulse Rate (!) 56     Resp 18     Temp 98.7 F (37.1 C)     Temp src      SpO2 96 %     Weight 150 lb (68 kg)     Height 5\' 6"  (1.676 m)     Head Circumference      Peak Flow      Pain Score 4     Pain Loc      Pain Edu?      Excl. in GC?      Constitutional: Alert and oriented. Well appearing  and in no acute distress. Eyes: Conjunctivae are normal. PERRL. EOMI. Head: Atraumatic. ENT:      Ears:      Nose: No congestion/rhinnorhea.      Mouth/Throat: Mucous membranes are moist.  Neck: No stridor. Cardiovascular: Normal rate, regular rhythm.  Good peripheral circulation. Respiratory: Normal respiratory effort without tachypnea or retractions. Lungs CTAB. Good air entry to the bases with no decreased or absent breath sounds. Gastrointestinal: Bowel sounds 4 quadrants. Soft and nontender to palpation. No guarding or rigidity. No palpable masses. No distention. Musculoskeletal: Full range of motion to all extremities. No gross deformities appreciated. Neurologic:  Normal speech and language. No gross focal neurologic deficits are appreciated.  Skin:  Skin is warm, dry and intact. No  rash noted. Psychiatric: Mood and affect are normal. Speech and behavior are normal. Patient exhibits appropriate insight and judgement.   ____________________________________________   LABS (all labs ordered are listed, but only abnormal results are displayed)  Labs Reviewed - No data to display ____________________________________________  EKG   ____________________________________________  RADIOLOGY   CT Head Wo Contrast  Addendum Date: 04/21/2020   ADDENDUM REPORT: 04/21/2020 19:16 ADDENDUM: No acute intracranial abnormality. Electronically Signed   By: Jonna Clark M.D.   On: 04/21/2020 19:16   Result Date: 04/21/2020 CLINICAL DATA:  Injury head pain EXAM: CT HEAD WITHOUT CONTRAST TECHNIQUE: Contiguous axial images were obtained from the base of the skull through the vertex without intravenous contrast. COMPARISON:  None. FINDINGS: Brain: No evidence of acute territorial infarction, hemorrhage, hydrocephalus,extra-axial collection or mass lesion/mass effect. Normal gray-white differentiation. Ventricles are normal in size and contour. Vascular: No hyperdense vessel or unexpected calcification. Skull: The skull is intact. No fracture or focal lesion identified. Sinuses/Orbits: The visualized paranasal sinuses and mastoid air cells are clear. The orbits and globes intact. Other: None IMPRESSION: No acute intracranial abnormality.  In Electronically Signed: By: Jonna Clark M.D. On: 04/21/2020 18:12    ____________________________________________    PROCEDURES  Procedure(s) performed:    Procedures    Medications  ibuprofen (ADVIL) tablet 600 mg (600 mg Oral Given 04/21/20 1933)     ____________________________________________   INITIAL IMPRESSION / ASSESSMENT AND PLAN / ED COURSE  Pertinent labs & imaging results that were available during my care of the patient were reviewed by me and considered in my medical decision making (see chart for details).  Review of  the [place] CSRS was performed in accordance of the [facility] prior to dispensing any controlled drugs.  Patient presented to emergency department for evaluation of head injury.  Vital signs and exam are reassuring.  Head CT negative for acute abnormalities.  Exam is consistent with concussion.  Patient will be discharged home with prescriptions for Motrin. Patient is to follow up with primary care as directed. Patient is given ED precautions to return to the ED for any worsening or new symptoms.   Tara Baldwin was evaluated in Emergency Department on 04/21/2020 for the symptoms described in the history of present illness. She was evaluated in the context of the global COVID-19 pandemic, which necessitated consideration that the patient might be at risk for infection with the SARS-CoV-2 virus that causes COVID-19. Institutional protocols and algorithms that pertain to the evaluation of patients at risk for COVID-19 are in a state of rapid change based on information released by regulatory bodies including the CDC and federal and state organizations. These policies and algorithms were followed during the patient's care in the ED.  ____________________________________________  FINAL  CLINICAL IMPRESSION(S) / ED DIAGNOSES  Final diagnoses:  Injury of head, initial encounter  Concussion without loss of consciousness, initial encounter      NEW MEDICATIONS STARTED DURING THIS VISIT:  ED Discharge Orders         Ordered    ibuprofen (ADVIL) 600 MG tablet  Every 6 hours PRN     Discontinue  Reprint     04/21/20 1927              This chart was dictated using voice recognition software/Dragon. Despite best efforts to proofread, errors can occur which can change the meaning. Any change was purely unintentional.    Enid Derry, PA-C 04/21/20 2254    Emily Filbert, MD 04/21/20 [phone]

## 2020-04-28 ENCOUNTER — Other Ambulatory Visit: Payer: Self-pay | Admitting: Certified Nurse Midwife

## 2020-04-28 ENCOUNTER — Other Ambulatory Visit: Payer: Self-pay

## 2020-04-28 DIAGNOSIS — Z7689 Persons encountering health services in other specified circumstances: Secondary | ICD-10-CM

## 2020-04-28 NOTE — Progress Notes (Signed)
Referral to Danielle Rankin, FNP at [facility].    [name], CNM [facility], [facility] 04/28/20 4:52 PM

## 2020-10-24 ENCOUNTER — Encounter: Payer: Self-pay | Admitting: Family Medicine

## 2020-10-24 ENCOUNTER — Other Ambulatory Visit: Payer: Self-pay

## 2020-10-24 ENCOUNTER — Ambulatory Visit (INDEPENDENT_AMBULATORY_CARE_PROVIDER_SITE_OTHER): Payer: Managed Care, Other (non HMO) | Admitting: Family Medicine

## 2020-10-24 VITALS — BP 107/59 | HR 54 | Temp 98.9°F | Resp 18 | Ht 66.25 in | Wt 156.3 lb

## 2020-10-24 DIAGNOSIS — M6208 Separation of muscle (nontraumatic), other site: Secondary | ICD-10-CM

## 2020-10-24 DIAGNOSIS — Z8616 Personal history of COVID-19: Secondary | ICD-10-CM

## 2020-10-24 DIAGNOSIS — Z7689 Persons encountering health services in other specified circumstances: Secondary | ICD-10-CM

## 2020-10-24 NOTE — Progress Notes (Signed)
Subjective:    Patient ID: Tara Baldwin, female    DOB: October 25, 1990, 30 y.o.   MRN: [id number]  Tara Baldwin is a 30 y.o. female presenting on 10/24/2020 for Establish Care (Pt want advise on if she should get the COVID vaccine. She had COVID twice. The first episode 11/2019,second episode 09/2020. )  She moved from [place] about 3 years ago. Previous [facility] will request records.  HPI   History of COVID 1st episode 11/2019 - self resolved. 2nd episode 10/04/20 - Urgent Care PCR test positive. Since that time symptoms have resolved and she has done one rapid test (10/09/20) was negative - she was feeling better at that time, and then 10/14/20 PCR [facility] - negative Did not require any hospitalization or IV antibody infusion or other treatment. No respiratory risk factors or asthma. No other medical history or immunocompromised history.  OB GYN C-section, 12/19/2017, delivery of boy. Now 30 years old doing well She has history of diastasis recti, issue with this following pregnancy.  Works as Designer, industrial/product at [employer]: Due for [employer], declines today despite counseling on benefits  No prior COVID vaccination.   Depression screen [facility] 2/9 10/24/2020 01/30/2018  Decreased Interest 0 0  Down, Depressed, Hopeless 0 0  PHQ - 2 Score 0 0  Altered sleeping - 1  Tired, decreased energy - 0  Change in appetite - 0  Feeling bad or failure about yourself  - 0  Trouble concentrating - 0  Moving slowly or fidgety/restless - 0  Suicidal thoughts - 0  PHQ-9 Score - 1    Past Medical History:  Diagnosis Date  . Diastasis recti 08/20/2018  . Headache   . STD (female) 05/16/2017   chlmydia   Past Surgical History:  Procedure Laterality Date  . CESAREAN SECTION N/A 12/19/2017   Procedure: CESAREAN SECTION;  Surgeon: Herold Harms, MD;  Location: [facility] ORS;  Service: Obstetrics;  Laterality: N/A;  . NO PAST SURGERIES     Social  History   Socioeconomic History  . Marital status: Single    Spouse name: Not on file  . Number of children: Not on file  . Years of education: Not on file  . Highest education level: Not on file  Occupational History  . Not on file  Tobacco Use  . Smoking status: Never Smoker  . Smokeless tobacco: Never Used  Vaping Use  . Vaping Use: Never used  Substance and Sexual Activity  . Alcohol use: Yes    Comment: occasional   . Drug use: No  . Sexual activity: Yes    Birth control/protection: Pill  Other Topics Concern  . Not on file  Social History Narrative  . Not on file   Social Determinants of Health   Financial Resource Strain: Not on file  Food Insecurity: Not on file  Transportation Needs: Not on file  Physical Activity: Not on file  Stress: Not on file  Social Connections: Not on file  Intimate Partner Violence: Not on file   Family History  Problem Relation Age of Onset  . Cancer Paternal Grandmother        Breast  . Breast cancer Paternal Grandmother   . Hypertension Father   . Ovarian cancer Neg Hx   . Colon cancer Neg Hx    Current Outpatient Medications on File Prior to Visit  Medication Sig  . Ascorbic Acid (VITAMIN C) 100 MG tablet Take  100 mg by mouth daily.  . Biotin 1 MG CAPS Take by mouth.  . cholecalciferol (VITAMIN D3) 25 MCG (1000 UNIT) tablet Take 1,000 Units by mouth daily.  Marland Kitchen ELDERBERRY PO Take by mouth.  Marland Kitchen ibuprofen (ADVIL) 600 MG tablet Take 1 tablet (600 mg total) by mouth every 6 (six) hours as needed.  . norethindrone-ethinyl estradiol (AUROVELA FE 1/20) 1-20 MG-MCG tablet Take 1 tablet by mouth daily.  . Turmeric 500 MG CAPS Take by mouth.   No current facility-administered medications on file prior to visit.    Review of Systems Per HPI unless specifically indicated above     Objective:    BP (!) 107/59 (BP Location: Right Arm, Patient Position: Sitting, Cuff Size: Normal)   Pulse (!) 54   Temp 98.9 F (37.2 C) (Oral)    Resp 18   Ht 5' 6.25" (1.683 m)   Wt 156 lb 4.8 oz (70.9 kg)   LMP 10/14/2020   SpO2 100%   BMI 25.04 kg/m   Wt Readings from Last 3 Encounters:  10/24/20 156 lb 4.8 oz (70.9 kg)  04/21/20 150 lb (68 kg)  12/13/19 151 lb 3.2 oz (68.6 kg)    Physical Exam Vitals and nursing note reviewed.  Constitutional:      General: She is not in acute distress.    Appearance: She is well-developed and well-nourished. She is not diaphoretic.     Comments: Well-appearing, comfortable, cooperative  HENT:     Head: Normocephalic and atraumatic.     Mouth/Throat:     Mouth: Oropharynx is clear and moist.  Eyes:     General:        Right eye: No discharge.        Left eye: No discharge.     Conjunctiva/sclera: Conjunctivae normal.  Cardiovascular:     Rate and Rhythm: Normal rate.  Pulmonary:     Effort: Pulmonary effort is normal.  Musculoskeletal:        General: No edema.  Skin:    General: Skin is warm and dry.     Findings: No erythema or rash.  Neurological:     Mental Status: She is alert and oriented to person, place, and time.  Psychiatric:        Mood and Affect: Mood and affect normal.        Behavior: Behavior normal.     Comments: Well groomed, good eye contact, normal speech and thoughts      Results for orders placed or performed in visit on 12/13/19  NuSwab Vaginitis (VG)  Result Value Ref Range   Atopobium vaginae Moderate - 1 Score   BVAB 2 Low - 0 Score   Megasphaera 1 Low - 0 Score   Candida albicans, NAA Negative Negative   Candida glabrata, NAA Negative Negative   Trich vag by NAA Negative Negative  IGP, Aptima HPV, rfx 16/18,45  Result Value Ref Range   Interpretation NILM    Category NIL    Adequacy ENDO    Clinician Provided ICD10 Comment    Performed by: Comment    Note: Comment    Test Methodology Comment    HPV Aptima Negative Negative      Assessment & Plan:   Problem List Items Addressed This Visit    Diastasis recti    Other Visit  Diagnoses    History of COVID-19    -  Primary   Relevant Orders   SARS-CoV-2 Semi-Quantitative Total Antibody, Spike  Encounter to establish care with new doctor          Establish care Request records from [facility] PCP Dr Levonne Lapping  COVID history Most recent 2nd episode resolved, asymptomatic now Advised protocol on covid vaccination, will check antibody level first, and give her advice, I do recommend vaccination however. Pfizer/Moderna as planned.  No orders of the defined types were placed in this encounter.     Follow up plan: Return in about 6 months (around 04/23/2021) for 6 month fasting [facility] visit then 1 week later Annual Physical.  Printed [facility] order for COVID antibody quant test today - will notify patient with results and vaccine advice further.  She will notify our office in about 6 months when ready for [facility] order req for upcoming physical labs.  Saralyn Pilar, DO [facility] 10/24/2020, 10:28 AM

## 2020-10-24 NOTE — Patient Instructions (Addendum)
Thank you for coming to the office today.  COVID Antibody test today - goal is >1000 antibody level, if >2000 likely good for at least 4-6 weeks until you decide on vaccine. If < 1000 I would recommend vaccine at your earliest convenience, [employer] / [facility].   DUE for FASTING BLOOD WORK (no food or drink after midnight before the lab appointment, only water or coffee without cream/sugar on the morning of)  [facility] in 6 months - contact us 1-2 weeks before, for the orders.  - Make sure Lab Only appointment is at about 1 week before your next appointment, so that results will be available  For Lab Results, once available within 2-3 days of blood draw, you can can log in to MyChart online to view your results and a brief explanation. Also, we can discuss results at next follow-up visit.    Please schedule a Follow-up Appointment to: Return in about 6 months (around 04/23/2021) for 6 month fasting [facility] visit then 1 week later Annual Physical.  If you have any other questions or concerns, please feel free to call the office or send a message through MyChart. You may also schedule an earlier appointment if necessary.  Additionally, you may be receiving a survey about your experience at our office within a few days to 1 week by e-mail or mail. We value your feedback.  Saralyn Pilar, DO [facility], [place]

## 2020-12-18 ENCOUNTER — Ambulatory Visit (INDEPENDENT_AMBULATORY_CARE_PROVIDER_SITE_OTHER): Payer: Managed Care, Other (non HMO) | Admitting: Certified Nurse Midwife

## 2020-12-18 ENCOUNTER — Encounter: Payer: Self-pay | Admitting: Certified Nurse Midwife

## 2020-12-18 ENCOUNTER — Other Ambulatory Visit: Payer: Self-pay

## 2020-12-18 VITALS — BP 120/75 | HR 60 | Ht 66.25 in | Wt 155.2 lb

## 2020-12-18 DIAGNOSIS — Z124 Encounter for screening for malignant neoplasm of cervix: Secondary | ICD-10-CM

## 2020-12-18 DIAGNOSIS — G4726 Circadian rhythm sleep disorder, shift work type: Secondary | ICD-10-CM | POA: Diagnosis not present

## 2020-12-18 DIAGNOSIS — R87612 Low grade squamous intraepithelial lesion on cytologic smear of cervix (LGSIL): Secondary | ICD-10-CM

## 2020-12-18 DIAGNOSIS — Z01419 Encounter for gynecological examination (general) (routine) without abnormal findings: Secondary | ICD-10-CM | POA: Diagnosis not present

## 2020-12-18 MED ORDER — NORETHIN ACE-ETH ESTRAD-FE 1-20 MG-MCG PO TABS: 1.0000 | ORAL_TABLET | Freq: Every day | ORAL | 3 refills | Status: DC

## 2020-12-18 NOTE — Progress Notes (Signed)
Pt present for annual exam. Pt stated that she was doing well and denies any gyn issues at this time.  

## 2020-12-18 NOTE — Patient Instructions (Signed)
Norethindrone Acetate; Ethinyl Estradiol; Ferrous Fumarate Capsules or Tablets What is this medicine? NORETHINDRONE; ETHINYL ESTRADIOL; FERROUS FUMARATE (nor eth IN drone; ETH in il es tra DYE ole; FER [place] FUE ma rate) is an oral contraceptive. The products combine two types of female hormones, an estrogen and a progestin. These products prevent ovulation and pregnancy. This medicine may be used for other purposes; ask your health care provider or pharmacist if you have questions. COMMON BRAND NAME(S): Aurovela [address] 1/20, [address] [place] [address], [address] Fe, [place], [place], Gildess 24 Fe, Gildess Fe 1.5/30, [place]ildess Fe 1/20, Hailey 24 Fe, Hailey Fe 1.5/30, Junel Fe 1.5/30, Junel Fe 1/20, Junel Fe 24, [place] Fe, Lo Loestrin Fe, Loestrin 24 Fe, Loestrin FE 1.5/30, Loestrin FE 1/20, [place] 24 Fe, [place], Microgestin 24 Fe, Microgestin Fe 1.5/30, Microgestin Fe 1/20, Tarina 24 Fe, Tarina Fe 1/20, Taysofy, Tayt[place], Tilia Fe, Tri-Legest Fe What should I tell my health care provider before I take this medicine? They need to know if you have any of these conditions:  abnormal vaginal bleeding  blood vessel disease or blood clots  breast, cervical, endometrial, ovarian, liver, or uterine cancer  diabetes  gallbladder disease  having surgery  heart disease or recent heart attack  high blood pressure  high cholesterol or triglycerides  history of irregular heartbeat or heart valve problems  kidney disease  liver disease  migraine headaches  protein C deficiency  protein S deficiency  recently had a baby, miscarriage, or abortion  stroke  systemic lupus erythematosus (SLE)  tobacco smoker  an unusual or allergic reaction to estrogens, progestins, other medicines, foods, dyes, or preservatives  pregnant or trying to get pregnant  breast-feeding How should I use this medicine? Take this medicine by mouth. To reduce nausea, this medicine may be taken with food. Follow  the directions on the prescription label. Take this medicine at the same time each day and in the order directed on the package. Do not take your medicine more often than directed. A patient package insert for the product will be given with each prescription and refill. Read this sheet carefully each time. The sheet may change frequently. Contact your pediatrician regarding the use of this medicine in children. Special care may be needed. This medicine has been used in female children who have started having menstrual periods. Overdosage: If you think you have taken too much of this medicine contact a poison control center or emergency room at once. NOTE: This medicine is only for you. Do not share this medicine with others. What if I miss a dose? If you miss a dose, refer to the patient information sheet you received with your medication for direction. If you miss more than one pill, this medication may not be as effective, and you may need to use another form of birth control. What may interact with this medicine? Do not take this medicine with the following medication:  dasabuvir; ombitasvir; paritaprevir; ritonavir  ombitasvir; paritaprevir; ritonavir This medicine may also interact with the following medications:  acetaminophen  antibiotics or medicines for infections, especially rifampin, rifabutin, rifapentine, and griseofulvin, and possibly penicillins or tetracyclines  aprepitant  ascorbic acid (vitamin C)  atorvastatin  barbiturate medicines, such as phenobarbital  bosentan  carbamazepine  caffeine  clofibrate  cyclosporine  dantrolene  doxercalciferol  felbamate  grapefruit juice  hydrocortisone  medicines for anxiety or sleeping problems, such as diazepam or temazepam  medicines for diabetes, including pioglitazone  mineral oil  modafinil  mycophenolate  nefazodone  oxcarbazepine  phenytoin  prednisolone  ritonavir or other medicines for  HIV infection or AIDS  rosuvastatin  selegiline  soy isoflavones supplements  St. John's wort  tamoxifen or raloxifene  theophylline  thyroid hormones  topiramate  warfarin This list may not describe all possible interactions. Give your health care provider a list of all the medicines, herbs, non-prescription drugs, or dietary supplements you use. Also tell them if you smoke, drink alcohol, or use illegal drugs. Some items may interact with your medicine. What should I watch for while using this medicine? Visit your doctor or health care professional for regular checks on your progress. You will need a regular breast and pelvic exam and Pap smear while on this medicine. Use an additional method of contraception during the first cycle that you take these tablets. If you have any reason to think you are pregnant, stop taking this medicine right away and contact your doctor or health care professional. If you are taking this medicine for hormone related problems, it may take several cycles of use to see improvement in your condition. Smoking increases the risk of getting a blood clot or having a stroke while you are taking birth control pills, especially if you are more than 29 years old. You are strongly advised not to smoke. This medicine can make your body retain fluid, making your fingers, hands, or ankles swell. Your blood pressure can go up. Contact your doctor or health care professional if you feel you are retaining fluid. This medicine can make you more sensitive to the sun. Keep out of the sun. If you cannot avoid being in the sun, wear protective clothing and use sunscreen. Do not use sun lamps or tanning beds/booths. If you wear contact lenses and notice visual changes, or if the lenses begin to feel uncomfortable, consult your eye care specialist. In some women, tenderness, swelling, or minor bleeding of the gums may occur. Notify your dentist if this happens. Brushing and  flossing your teeth regularly may help limit this. See your dentist regularly and inform your dentist of the medicines you are taking. If you are going to have elective surgery, you may need to stop taking this medicine before the surgery. Consult your health care professional for advice. This medicine does not protect you against HIV infection (AIDS) or any other sexually transmitted diseases. What side effects may I notice from receiving this medicine? Side effects that you should report to your doctor or health care professional as soon as possible:  allergic reactions such as skin rash or itching, hives, swelling of the lips, mouth, tongue, or throat  breast tissue changes or discharge  dark patches of skin on your forehead, cheeks, upper lip, and chin  depression  high blood pressure  migraines or severe, sudden headaches  signs and symptoms of a blood clot such as breathing problems; changes in vision; chest pain; severe, sudden headache; pain, swelling, warmth in the leg; trouble speaking; sudden numbness or weakness of the face, arm or leg  stomach pain  symptoms of vaginal infection like itching, irritation or unusual discharge  yellowing of the eyes or skin Side effects that usually do not require medical attention (report these to your doctor or health care professional if they continue or are bothersome):  acne  breast pain, tenderness  irregular vaginal bleeding or spotting, particularly during the first month of use  mild headache  nausea  weight gain (slight) This list may not describe all  possible side effects. Call your doctor for medical advice about side effects. You may report side effects to FDA at 1-800-FDA-1088. Where should I keep my medicine? Keep out of the reach of children. Store at room temperature between 15 and 30 degrees C (59 and 86 degrees F). Throw away any unused medicine after the expiration date. NOTE: This sheet is a summary. It may not  cover all possible information. If you have questions about this medicine, talk to your doctor, pharmacist, or health care provider.  2021 Elsevier/Gold Standard (2020-08-14 12:27:45)   Preventive Care 74-48 Years Old, Female Preventive care refers to lifestyle choices and visits with your health care provider that can promote health and wellness. This includes:  A yearly physical exam. This is also called an annual wellness visit.  Regular dental and eye exams.  Immunizations.  Screening for certain conditions.  Healthy lifestyle choices, such as: ? Eating a healthy diet. ? Getting regular exercise. ? Not using drugs or products that contain nicotine and tobacco. ? Limiting alcohol use. What can I expect for my preventive care visit? Physical exam Your health care provider may check your:  Height and weight. These may be used to calculate your BMI (body mass index). BMI is a measurement that tells if you are at a healthy weight.  Heart rate and blood pressure.  Body temperature.  Skin for abnormal spots. Counseling Your health care provider may ask you questions about your:  Past medical problems.  Family's medical history.  Alcohol, tobacco, and drug use.  Emotional well-being.  Home life and relationship well-being.  Sexual activity.  Diet, exercise, and sleep habits.  Work and work Statistician.  Access to firearms.  Method of birth control.  Menstrual cycle.  Pregnancy history. What immunizations do I need? Vaccines are usually given at various ages, according to a schedule. Your health care provider will recommend vaccines for you based on your age, medical history, and lifestyle or other factors, such as travel or where you work.   What tests do I need? Blood tests  Lipid and cholesterol levels. These may be checked every 5 years starting at age 68.  Hepatitis C test.  Hepatitis B test. Screening  Diabetes screening. This is done by checking  your blood sugar (glucose) after you have not eaten for a while (fasting).  STD (sexually transmitted disease) testing, if you are at risk.  BRCA-related cancer screening. This may be done if you have a family history of breast, ovarian, tubal, or peritoneal cancers.  Pelvic exam and Pap test. This may be done every 3 years starting at age 27. Starting at age 22, this may be done every 5 years if you have a Pap test in combination with an HPV test. Talk with your health care provider about your test results, treatment options, and if necessary, the need for more tests.   Follow these instructions at home: Eating and drinking  Eat a healthy diet that includes fresh fruits and vegetables, whole grains, lean protein, and low-fat dairy products.  Take vitamin and mineral supplements as recommended by your health care provider.  Do not drink alcohol if: ? Your health care provider tells you not to drink. ? You are pregnant, may be pregnant, or are planning to become pregnant.  If you drink alcohol: ? Limit how much you have to 0-1 drink a day. ? Be aware of how much alcohol is in your drink. In the [place] one drink equals one 12 oz  bottle of beer (355 mL), one 5 oz glass of wine (148 mL), or one 1 oz glass of hard liquor (44 mL).   Lifestyle  Take daily care of your teeth and gums. Brush your teeth every morning and night with fluoride toothpaste. Floss one time each day.  Stay active. Exercise for at least 30 minutes 5 or more days each week.  Do not use any products that contain nicotine or tobacco, such as cigarettes, e-cigarettes, and chewing tobacco. If you need help quitting, ask your health care provider.  Do not use drugs.  If you are sexually active, practice safe sex. Use a condom or other form of protection to prevent STIs (sexually transmitted infections).  If you do not wish to become pregnant, use a form of birth control. If you plan to become pregnant, see your health care  provider for a prepregnancy visit.  Find healthy ways to cope with stress, such as: ? Meditation, yoga, or listening to music. ? Journaling. ? Talking to a trusted person. ? Spending time with friends and family. Safety  Always wear your seat belt while driving or riding in a vehicle.  Do not drive: ? If you have been drinking alcohol. Do not ride with someone who has been drinking. ? When you are tired or distracted. ? While texting.  Wear a helmet and other protective equipment during sports activities.  If you have firearms in your house, make sure you follow all gun safety procedures.  Seek help if you have been physically or sexually abused. What's next?  Go to your health care provider once a year for an annual wellness visit.  Ask your health care provider how often you should have your eyes and teeth checked.  Stay up to date on all vaccines. This information is not intended to replace advice given to you by your health care provider. Make sure you discuss any questions you have with your health care provider. Document Revised: 05/21/2020 Document Reviewed: 06/04/2018 Elsevier Patient Education  2021 [employer].

## 2020-12-18 NOTE — Progress Notes (Signed)
ANNUAL PREVENTATIVE CARE GYN  ENCOUNTER NOTE  Subjective:       Tara Baldwin is a 30 y.o. G77P1001 female here for a routine annual gynecologic exam.  Current complaints: 1. Requests OCP refill, labs, and pap smear 2. Considering pregnancy  Denies difficulty breathing or respiratory distress, chest pain, abdominal pain, excessive vaginal bleeding, dysuria, and leg pain or swelling.    Gynecologic History  Patient's last menstrual period was 12/13/2020. Period Duration (Days): 5 Period Pattern: (!) Irregular Menstrual Flow: Light Menstrual Control: Thin pad Menstrual Control Change Freq (Hours): 4-6 Dysmenorrhea: (!) Severe Dysmenorrhea Symptoms: Cramping  Contraception: OCP (estrogen/progesterone)  Last Pap: 12/2019. Results were: normal; history LSIL 2019  Obstetric History  OB History  Gravida Para Term Preterm AB Living  1 1 1     1   SAB IAB Ectopic Multiple Live Births        0 1    # Outcome Date GA Lbr Len/2nd Weight Sex Delivery Anes PTL Lv  1 Term 12/19/17 [redacted]w[redacted]d  7 lb 0.9 oz (3.2 kg) M CS-LTranv EPI  LIV    Past Medical History:  Diagnosis Date  . Diastasis recti 08/20/2018  . Headache   . STD (female) 05/16/2017   chlmydia    Past Surgical History:  Procedure Laterality Date  . CESAREAN SECTION N/A 12/19/2017   Procedure: CESAREAN SECTION;  Surgeon: 12/21/2017, MD;  Location: [facility] ORS;  Service: Obstetrics;  Laterality: N/A;  . NO PAST SURGERIES      Current Outpatient Medications on File Prior to Visit  Medication Sig Dispense Refill  . Ascorbic Acid (VITAMIN C) 100 MG tablet Take 100 mg by mouth daily.    . Biotin 1 MG CAPS Take by mouth.    . cholecalciferol (VITAMIN D3) 25 MCG (1000 UNIT) tablet Take 1,000 Units by mouth daily.    Herold Harms ELDERBERRY PO Take by mouth.    Marland Kitchen ibuprofen (ADVIL) 600 MG tablet Take 1 tablet (600 mg total) by mouth every 6 (six) hours as needed. 30 tablet 0  . Turmeric 500 MG CAPS Take by mouth.     No current  facility-administered medications on file prior to visit.    No Known Allergies  Social History   Socioeconomic History  . Marital status: Single    Spouse name: Not on file  . Number of children: Not on file  . Years of education: Not on file  . Highest education level: Not on file  Occupational History  . Not on file  Tobacco Use  . Smoking status: Never Smoker  . Smokeless tobacco: Never Used  Vaping Use  . Vaping Use: Never used  Substance and Sexual Activity  . Alcohol use: Yes    Comment: occasional   . Drug use: No  . Sexual activity: Yes    Birth control/protection: Pill  Other Topics Concern  . Not on file  Social History Narrative  . Not on file   Social Determinants of Health   Financial Resource Strain: Not on file  Food Insecurity: Not on file  Transportation Needs: Not on file  Physical Activity: Not on file  Stress: Not on file  Social Connections: Not on file  Intimate Partner Violence: Not on file    Family History  Problem Relation Age of Onset  . Cancer Paternal Grandmother        Breast  . Breast cancer Paternal Grandmother   . Hypertension Father   . Ovarian cancer Neg Hx   .  Colon cancer Neg Hx     The following portions of the patient's history were reviewed and updated as appropriate: allergies, current medications, past family history, past medical history, past social history, past surgical history and problem list.  Review of Systems  ROS negative except as noted above. Information obtained from patient.    Objective:   BP 120/75   Pulse 60   Ht 5' 6.25" (1.683 m)   Wt 155 lb 3.2 oz (70.4 kg)   LMP 12/13/2020   BMI 24.86 kg/m    CONSTITUTIONAL: Well-developed, well-nourished female in no acute distress.   PSYCHIATRIC: Normal mood and affect. Normal behavior. Normal judgment and thought content.  NEUROLGIC: Alert and oriented to person, place, and time. Normal muscle tone coordination. No  cranial nerve deficit  noted.  HENT:  Normocephalic, atraumatic.  EYES: Conjunctivae and EOM are normal.   NECK: Normal range of motion, supple, no masses.  Normal thyroid.   SKIN: Skin is warm and dry. No rash noted. Not diaphoretic. No erythema. No pallor.  CARDIOVASCULAR: Normal heart rate noted, regular rhythm, no murmur.  RESPIRATORY: Clear to auscultation bilaterally. Effort and breath sounds normal, no problems with respiration noted.  BREASTS: Symmetric in size. No masses, skin changes, nipple drainage, or lymphadenopathy.  ABDOMEN: Soft, normal bowel sounds, no distention noted.  No tenderness, rebound or guarding.   PELVIC:  External Genitalia: Normal  Vagina: Normal  Cervix: Normal, Pap collected  Uterus: Normal  Adnexa: Normal  MUSCULOSKELETAL: Normal range of motion. No tenderness.  No cyanosis, clubbing, or edema.  2+ distal pulses.  LYMPHATIC: No Axillary, Supraclavicular, or Inguinal Adenopathy.  Assessment:   Annual gynecologic examination 30 y.o.   Contraception: OCP (estrogen/progesterone)   Normal BMI   Problem List Items Addressed This Visit   None   Visit Diagnoses    Encounter for well woman exam with routine gynecological exam    -  Primary   Relevant Orders   IGP, Aptima HPV, rfx 16/18,45   TSH+T4F+T3Free   VITAMIN D 25 Hydroxy (Vit-D Deficiency, Fractures)   CBC   Comprehensive metabolic panel   LGSIL on Pap smear of cervix       Relevant Orders   IGP, Aptima HPV, rfx 16/18,45   Screening for cervical cancer       Relevant Orders   IGP, Aptima HPV, rfx 16/18,45   Shift work sleep disorder       Relevant Orders   TSH+T4F+T3Free   VITAMIN D 25 Hydroxy (Vit-D Deficiency, Fractures)   CBC   Comprehensive metabolic panel      Plan:   Pap: Pap Co Test  Labs: See orders  Routine preventative health maintenance measures emphasized: Exercise/Diet/Weight control, Tobacco Warnings, Alcohol/Substance use risks and Stress Management; see AVS  Rx Loestrin  Fe, see orders  Return to Clinic - 1 Year for [facility] or sooner if needed   [name], CNM  [facility] 12/18/20 1:55 PM

## 2020-12-19 LAB — COMPREHENSIVE METABOLIC PANEL
ALT: 7 IU/L (ref 0–32)
AST: 18 IU/L (ref 0–40)
Albumin/Globulin Ratio: 1.5 (ref 1.2–2.2)
Albumin: 4.5 g/dL (ref 3.9–5.0)
Alkaline Phosphatase: 56 IU/L (ref 44–121)
BUN/Creatinine Ratio: 13 (ref 9–23)
BUN: 12 mg/dL (ref 6–20)
Bilirubin Total: 0.3 mg/dL (ref 0.0–1.2)
CO2: 23 mmol/L (ref 20–29)
Calcium: 9.7 mg/dL (ref 8.7–10.2)
Chloride: 102 mmol/L (ref 96–106)
Creatinine, Ser: 0.92 mg/dL (ref 0.57–1.00)
Globulin, Total: 3.1 g/dL (ref 1.5–4.5)
Glucose: 66 mg/dL (ref 65–99)
Potassium: 3.6 mmol/L (ref 3.5–5.2)
Sodium: 140 mmol/L (ref 134–144)
Total Protein: 7.6 g/dL (ref 6.0–8.5)
eGFR: 87 mL/min/{1.73_m2} (ref >59)

## 2020-12-19 LAB — CBC
Hematocrit: 42.2 % (ref 34.0–46.6)
Hemoglobin: 14 g/dL (ref 11.1–15.9)
MCH: 29.5 pg (ref 26.6–33.0)
MCHC: 33.2 g/dL (ref 31.5–35.7)
MCV: 89 fL (ref 79–97)
Platelets: 230 10*3/uL (ref 150–450)
RBC: 4.75 x10E6/uL (ref 3.77–5.28)
RDW: 12 % (ref 11.7–15.4)
WBC: 4.1 10*3/uL (ref 3.4–10.8)

## 2020-12-19 LAB — TSH+T4F+T3FREE
Free T4: 1.22 ng/dL (ref 0.82–1.77)
T3, Free: 2.4 pg/mL (ref 2.0–4.4)
TSH: 2.58 u[IU]/mL (ref 0.450–4.500)

## 2020-12-19 LAB — VITAMIN D 25 HYDROXY (VIT D DEFICIENCY, FRACTURES): Vit D, 25-Hydroxy: 43.9 ng/mL (ref 30.0–100.0)

## 2020-12-21 LAB — IGP, APTIMA HPV, RFX 16/18,45: HPV Aptima: NEGATIVE

## 2021-02-04 ENCOUNTER — Other Ambulatory Visit: Payer: Self-pay | Admitting: Certified Nurse Midwife

## 2021-04-04 LAB — LIPID PANEL
Cholesterol: 222 — AB (ref 0–200)
HDL: 85 — AB (ref 35–70)
LDL Cholesterol: 127
Triglycerides: 56 (ref 40–160)

## 2021-04-04 LAB — COMPREHENSIVE METABOLIC PANEL: GFR calc Af Amer: 87

## 2021-04-04 LAB — HEMOGLOBIN A1C: Hemoglobin A1C: 5.6

## 2021-04-04 LAB — BASIC METABOLIC PANEL
Creatinine: 0.9 (ref 0.5–1.1)
Glucose: 64

## 2021-04-04 LAB — NOVEL CORONAVIRUS, NAA: SARS-CoV-2, NAA: 3382

## 2021-04-23 ENCOUNTER — Ambulatory Visit (INDEPENDENT_AMBULATORY_CARE_PROVIDER_SITE_OTHER): Payer: Managed Care, Other (non HMO) | Admitting: Family Medicine

## 2021-04-23 ENCOUNTER — Other Ambulatory Visit: Payer: Self-pay

## 2021-04-23 ENCOUNTER — Encounter: Payer: Self-pay | Admitting: Family Medicine

## 2021-04-23 VITALS — BP 105/62 | HR 63 | Ht 66.0 in | Wt 158.0 lb

## 2021-04-23 DIAGNOSIS — N926 Irregular menstruation, unspecified: Secondary | ICD-10-CM | POA: Diagnosis not present

## 2021-04-23 DIAGNOSIS — Z0000 Encounter for general adult medical examination without abnormal findings: Secondary | ICD-10-CM

## 2021-04-23 LAB — POCT URINE PREGNANCY: Preg Test, Ur: POSITIVE — AB

## 2021-04-23 NOTE — Progress Notes (Signed)
Subjective:    Patient ID: Tara Baldwin, female    DOB: Mar 14, 1991, 30 y.o.   MRN: [id number]  Tara Baldwin is a 30 y.o. female presenting on 04/23/2021 for Annual Exam   HPI  Here for Annual Physical  She had 2 week [employer] recently.  She is working on Engineer, civil (consulting) diet / lifestyle.  OB GYN Prior history C-section, 12/19/2017, delivery of baby boy. She has history of diastasis recti, issue with this following pregnancy.  Today asks about pregnancy test, she is almost 2 months since last menstrual cycle.  Last menstrual cycle 03/06/21 Last home Urine pregnancy negative 2 weeks ago.  She is attempting to conceive child with her husband. Inconsistent attempting for for pregnancy in past 2 months.  Works as Quarry manager at [employer] 2/9 04/23/2021 10/24/2020 01/30/2018  Decreased Interest 0 0 0  Down, Depressed, Hopeless 0 0 0  PHQ - 2 Score 0 0 0  Altered sleeping 1 - 1  Tired, decreased energy 1 - 0  Change in appetite 0 - 0  Feeling bad or failure about yourself  0 - 0  Trouble concentrating 0 - 0  Moving slowly or fidgety/restless 0 - 0  Suicidal thoughts 0 - 0  PHQ-9 Score 2 - 1  Difficult doing work/chores Not difficult at all - -    Past Medical History:  Diagnosis Date   Diastasis recti 08/20/2018   Headache    STD (female) 05/16/2017   chlmydia   Past Surgical History:  Procedure Laterality Date   CESAREAN SECTION N/A 12/19/2017   Procedure: CESAREAN SECTION;  Surgeon: Brayton Mars, MD;  Location: [facility] ORS;  Service: Obstetrics;  Laterality: N/A;   NO PAST SURGERIES     Social History   Socioeconomic History   Marital status: Single    Spouse name: Not on file   Number of children: Not on file   Years of education: Not on file   Highest education level: Not on file  Occupational History   Not on file  Tobacco Use   Smoking status: Never   Smokeless tobacco: Never  Vaping Use   Vaping Use: Never  used  Substance and Sexual Activity   Alcohol use: Yes    Comment: occasional    Drug use: No   Sexual activity: Yes    Birth control/protection: Pill  Other Topics Concern   Not on file  Social History Narrative   Not on file   Social Determinants of Health   Financial Resource Strain: Not on file  Food Insecurity: Not on file  Transportation Needs: Not on file  Physical Activity: Not on file  Stress: Not on file  Social Connections: Not on file  Intimate Partner Violence: Not on file   Family History  Problem Relation Age of Onset   Cancer Paternal Grandmother        Breast   Breast cancer Paternal Grandmother    Hypertension Father    Ovarian cancer Neg Hx    Colon cancer Neg Hx    Current Outpatient Medications on File Prior to Visit  Medication Sig   Ascorbic Acid (VITAMIN C) 100 MG tablet Take 100 mg by mouth daily.   Biotin 1 MG CAPS Take by mouth.   BLISOVI FE 1/20 1-20 MG-MCG tablet TAKE 1 TABLET BY MOUTH DAILY   cholecalciferol (VITAMIN D3) 25 MCG (1000 UNIT) tablet Take 1,000 Units by mouth daily.   ELDERBERRY PO  Take by mouth.   ibuprofen (ADVIL) 600 MG tablet Take 1 tablet (600 mg total) by mouth every 6 (six) hours as needed.   Turmeric 500 MG CAPS Take by mouth.   No current facility-administered medications on file prior to visit.    Review of Systems  Constitutional:  Negative for activity change, appetite change, chills, diaphoresis, fatigue and fever.  HENT:  Negative for congestion and hearing loss.   Eyes:  Negative for visual disturbance.  Respiratory:  Negative for cough, chest tightness, shortness of breath and wheezing.   Cardiovascular:  Negative for chest pain, palpitations and leg swelling.  Gastrointestinal:  Negative for abdominal pain, constipation, diarrhea, nausea and vomiting.  Genitourinary:  Negative for dysuria, frequency and hematuria.  Musculoskeletal:  Negative for arthralgias and neck pain.  Skin:  Negative for rash.   Allergic/Immunologic: Negative for environmental allergies.  Neurological:  Negative for dizziness, weakness, light-headedness, numbness and headaches.  Hematological:  Negative for adenopathy.  Psychiatric/Behavioral:  Negative for behavioral problems, dysphoric mood and sleep disturbance.   Per HPI unless specifically indicated above     Objective:    BP 105/62   Pulse 63   Ht _0  (1.676 m)   Wt 158 lb (71.7 kg)   SpO2 98%   BMI 25.50 kg/m   Wt Readings from Last 3 Encounters:  04/23/21 158 lb (71.7 kg)  12/18/20 155 lb 3.2 oz (70.4 kg)  10/24/20 156 lb 4.8 oz (70.9 kg)    Physical Exam Vitals and nursing note reviewed.  Constitutional:      General: She is not in acute distress.    Appearance: She is well-developed. She is not diaphoretic.     Comments: Well-appearing, comfortable, cooperative  HENT:     Head: Normocephalic and atraumatic.  Eyes:     General:        Right eye: No discharge.        Left eye: No discharge.     Conjunctiva/sclera: Conjunctivae normal.     Pupils: Pupils are equal, round, and reactive to light.  Neck:     Thyroid: No thyromegaly.  Cardiovascular:     Rate and Rhythm: Normal rate and regular rhythm.     Pulses: Normal pulses.     Heart sounds: Normal heart sounds. No murmur heard. Pulmonary:     Effort: Pulmonary effort is normal. No respiratory distress.     Breath sounds: Normal breath sounds. No wheezing or rales.  Abdominal:     General: Bowel sounds are normal. There is no distension.     Palpations: Abdomen is soft. There is no mass.     Tenderness: There is no abdominal tenderness.  Musculoskeletal:        General: No tenderness. Normal range of motion.     Cervical back: Normal range of motion and neck supple.     Right lower leg: No edema.     Left lower leg: No edema.     Comments: Upper / Lower Extremities: - Normal muscle tone, strength bilateral upper extremities 5/5, lower extremities 5/5  Lymphadenopathy:      Cervical: No cervical adenopathy.  Skin:    General: Skin is warm and dry.     Findings: No erythema or rash.  Neurological:     Mental Status: She is alert and oriented to person, place, and time.     Comments: Distal sensation intact to light touch all extremities  Psychiatric:        Mood  and Affect: Mood normal.        Behavior: Behavior normal.        Thought Content: Thought content normal.     Comments: Well groomed, good eye contact, normal speech and thoughts    [facility] results 04/04/21 A1c 5.6 (previously 5.7) Glucose 64 Creatinine 0.92, eGFR 87  Lipid Total Cholesterol 222 (prior 243) Triglyceride 56 HDL 85 LDL 127 (LDL 147 1 year ago)  COVID antibody 3382  Results for orders placed or performed in visit on 04/23/21  POCT urine pregnancy  Result Value Ref Range   Preg Test, Ur Positive (A) Negative      Assessment & Plan:   Problem List Items Addressed This Visit   None Visit Diagnoses     Annual physical exam    -  Primary   Menstrual period late       Relevant Orders   POCT Pregnancy, Urine   POCT urine pregnancy (Completed)      Updated Health Maintenance information UTD Pap Smear 12/2020 Reviewed recent lab results with patient - [facility] biometric results. Counseling on mild elevated LDL >120, otherwise normal excellent HDL result, explains mild elevated TC >200. Counseling on diet lifestyle low cholesterol options, no therapy required otherwise A1c to 5.6, improved from 5.7, goal to limit excess carb/starches, will be checked during pregnancy as well. Encouraged improvement to lifestyle with diet and exercise Goal maintain healthy weight.  Her previous OBGYN provider is leaving [place]. She will contact them to schedule with new provider for new pregnancy, now today confirmed on urine Pregnancy test here in office = POSITIVE. Patient was anticipating and hoping for this pregnancy and she is excited and will proceed w/ contacting GYN to establish  care.   No orders of the defined types were placed in this encounter.   Follow up plan: Return in about 1 year (around 04/23/2022).  Nobie Putnam, [facility] 04/23/2021, 2:54 PM

## 2021-04-23 NOTE — Patient Instructions (Addendum)
Thank you for coming to the office today.  CONGRATULATIONS  Results for orders placed or performed in visit on 04/23/21 (from the past 24 hour(s))  POCT urine pregnancy     Status: Abnormal   Collection Time: 04/23/21  3:03 PM  Result Value Ref Range   Preg Test, Ur Positive (A) Negative     Improved A1c 5.6, below range of pre diabetes  Lipids cholesterol LDL improved from last year, try to focus on some lower cholesterol options going forward but overall keep up the great work.  Recommended other provider now at [facility], or can consider [facility] if preferred.   Please schedule a Follow-up Appointment to: Return in about 1 year (around 04/23/2022).  If you have any other questions or concerns, please feel free to call the office or send a message through MyChart. You may also schedule an earlier appointment if necessary.  Additionally, you may be receiving a survey about your experience at our office within a few days to 1 week by e-mail or mail. We value your feedback.  Saralyn Pilar, DO [facility], [place]

## 2021-05-18 ENCOUNTER — Other Ambulatory Visit: Payer: Self-pay

## 2021-05-18 ENCOUNTER — Ambulatory Visit (INDEPENDENT_AMBULATORY_CARE_PROVIDER_SITE_OTHER): Payer: Managed Care, Other (non HMO) | Admitting: Obstetrics and Gynecology

## 2021-05-18 VITALS — BP 112/69 | HR 65 | Resp 16 | Ht 66.0 in | Wt 156.6 lb

## 2021-05-18 DIAGNOSIS — Z3491 Encounter for supervision of normal pregnancy, unspecified, first trimester: Secondary | ICD-10-CM | POA: Diagnosis not present

## 2021-05-18 DIAGNOSIS — Z3401 Encounter for supervision of normal first pregnancy, first trimester: Secondary | ICD-10-CM

## 2021-05-18 DIAGNOSIS — Z113 Encounter for screening for infections with a predominantly sexual mode of transmission: Secondary | ICD-10-CM | POA: Diagnosis not present

## 2021-05-18 DIAGNOSIS — Z3A10 10 weeks gestation of pregnancy: Secondary | ICD-10-CM

## 2021-05-18 NOTE — Progress Notes (Signed)
Tara Baldwin presents for NOB nurse interview visit. Pregnancy confirmation done 04/23/2021 at [facility] with Dr. Althea Charon, [id number]. Pregnancy education material explained and given. 0 cats in the home. NOB labs ordered.  Sickle cell ordered. HIV labs and Drug screen were explained optional and she did decline. Drug screen declined. PNV encouraged, she has been taking prenatal and does not need a prescription at this time. Genetic screening options discussed. Genetic testing: Ordered. [id number], she works for [employer].  Pt may discuss with provider.  Financial policy reviewed. FMLA form reviewed and signed. Pt. To follow up with provider in 2 weeks for NOB physical.  All questions answered.

## 2021-05-18 NOTE — Patient Instructions (Signed)
[facility]  [facility]  [address], [place], [place] [address]  Phone: [phone]  [facility] (second location)  [address] [place], [place] [address]  Phone: [phone]  [facility] [address], [place], [place] [address] Phone: [phone]  [facility]  [address] [place], [place] [address]  Phone: [phone] Class  March 19, 2017  Wednesday 7:00p - 9:00p  [facility] [place]  April 23, 2017  Wednesday 7:00p - 9:00p [facility] [place]    May 28, 2017   Wednesday 7:00p - 9:000p [facility] [place]  June 25, 2017  Wednesday  7:00p - 9:00p [facility] [place]  July 23, 2017 Wednesday 7:00p - 9:00p [facility] [place]  Interested in a waterbirth?  This informational class will help you discover whether waterbirth is the right fit for you.  Education about waterbirth itself, supplies you would need and how to assemble your support team is what you can expect from this class.  Some obstetrical practices require this class in order to pursue a waterbirth.  (Not all obstetrical practices offer waterbirth check with your healthcare provider)  Register only the expectant mom, but you are encouraged to bring your partner to class!  Fees & Payment No fee  Register Online [email or web address] Search Waterbirth http://www.bray.com/https://www.cdc.gov/pregnancy/infections.html">  First Trimester of Pregnancy  The first trimester of pregnancy starts on the first day of your last menstrual period until the end of week 12. This is also called months 1 through 3 ofpregnancy. Body changes during your first trimester Your body goes through many changes during pregnancy. The changes usuallyreturn to normal after your baby is  born. Physical changes You may gain or lose weight. Your breasts may grow larger and hurt. The area around your nipples may get darker. Dark spots or blotches may develop on your face. You may have changes in your hair. Health changes You may feel like you might vomit (nauseous), and you may vomit. You may have heartburn. You may have headaches. You may have trouble pooping (constipation). Your gums may bleed. Other changes You may get tired easily. You may pee (urinate) more often. Your menstrual periods will stop. You may not feel hungry. You may want to eat certain kinds of food. You may have changes in your emotions from day to day. You may have more dreams. Follow these instructions at home: Medicines Take over-the-counter and prescription medicines only as told by your doctor. Some medicines are not safe during pregnancy. Take a prenatal vitamin that contains at least 600 micrograms (mcg) of folic acid. Eating and drinking Eat healthy meals that include: Fresh fruits and vegetables. Whole grains. Good sources of protein, such as meat, eggs, or tofu. Low-fat dairy products. Avoid raw meat and unpasteurized juice, milk, and cheese. If you feel like you may vomit, or you vomit: Eat 4 or 5 small meals a day instead of 3 large meals. Try eating a few soda crackers. Drink liquids between meals instead of during meals. You may need to take these actions to prevent or treat trouble pooping: Drink enough fluids to keep your pee (urine) pale yellow. Eat foods that are high in fiber. These include beans, whole grains, and fresh fruits and vegetables. Limit foods that are high in fat and sugar. These include fried or sweet foods. Activity Exercise only as told by your doctor. Most people  can do their usual exercise routine during pregnancy. Stop exercising if you have cramps or pain in your lower belly (abdomen) or low back. Do not exercise if it is too hot or too humid, or if you  are in a place of great height (high altitude). Avoid heavy lifting. If you choose to, you may have sex unless your doctor tells you not to. Relieving pain and discomfort Wear a good support bra if your breasts are sore. Rest with your legs raised (elevated) if you have leg cramps or low back pain. If you have bulging veins (varicose veins) in your legs: Wear support hose as told by your doctor. Raise your feet for 15 minutes, 3-4 times a day. Limit salt in your food. Safety Wear your seat belt at all times when you are in a car. Talk with your doctor if someone is hurting you or yelling at you. Talk with your doctor if you are feeling sad or have thoughts of hurting yourself. Lifestyle Do not use hot tubs, steam rooms, or saunas. Do not douche. Do not use tampons or scented sanitary pads. Do not use herbal medicines, illegal drugs, or medicines that are not approved by your doctor. Do not drink alcohol. Do not smoke or use any products that contain nicotine or tobacco. If you need help quitting, ask your doctor. Avoid cat litter boxes and soil that is used by cats. These carry germs that can cause harm to the baby and can cause a loss of your baby by miscarriage or stillbirth. General instructions Keep all follow-up visits. This is important. Ask for help if you need counseling or if you need help with nutrition. Your doctor can give you advice or tell you where to go for help. Visit your dentist. At home, brush your teeth with a soft toothbrush. Floss gently. Write down your questions. Take them to your prenatal visits. Where to find more information American Pregnancy Association: americanpregnancy.org [employer] of Obstetricians and Gynecologists: www.acog.org Office on Women's Health: MightyReward.co.nz Contact a doctor if: You are dizzy. You have a fever. You have mild cramps or pressure in your lower belly. You have a nagging pain in your belly area. You  continue to feel like you may vomit, you vomit, or you have watery poop (diarrhea) for 24 hours or longer. You have a bad-smelling fluid coming from your vagina. You have pain when you pee. You are exposed to a disease that spreads from person to person, such as chickenpox, measles, Zika virus, HIV, or hepatitis. Get help right away if: You have spotting or bleeding from your vagina. You have very bad belly cramping or pain. You have shortness of breath or chest pain. You have any kind of injury, such as from a fall or a car crash. You have new or increased pain, swelling, or redness in an arm or leg. Summary The first trimester of pregnancy starts on the first day of your last menstrual period until the end of week 12 (months 1 through 3). Eat 4 or 5 small meals a day instead of 3 large meals. Do not smoke or use any products that contain nicotine or tobacco. If you need help quitting, ask your doctor. Keep all follow-up visits. This information is not intended to replace advice given to you by your health care provider. Make sure you discuss any questions you have with your healthcare provider. Document Revised: 03/01/2020 Document Reviewed: 01/06/2020 Elsevier Patient Education  2022 [employer]. Commonly Asked Questions During  Pregnancy  Cats: A parasite can be excreted in cat feces.  To avoid exposure you need to have another person empty the little box.  If you must empty the litter box you will need to wear gloves.  Wash your hands after handling your cat.  This parasite can also be found in raw or undercooked meat so this should also be avoided.  Colds, Sore Throats, Flu: Please check your medication sheet to see what you can take for symptoms.  If your symptoms are unrelieved by these medications please call the office.  Dental Work: Most any dental work Agricultural consultant recommends is permitted.  X-rays should only be taken during the first trimester if absolutely necessary.  Your  abdomen should be shielded with a lead apron during all x-rays.  Please notify your provider prior to receiving any x-rays.  Novocaine is fine; gas is not recommended.  If your dentist requires a note from [place] prior to dental work please call the office and we will provide one for you.  Exercise: Exercise is an important part of staying healthy during your pregnancy.  You may continue most exercises you were accustomed to prior to pregnancy.  Later in your pregnancy you will most likely notice you have difficulty with activities requiring balance like riding a bicycle.  It is important that you listen to your body and avoid activities that put you at a higher risk of falling.  Adequate rest and staying well hydrated are a must!  If you have questions about the safety of specific activities ask your provider.    Exposure to Children with illness: Try to avoid obvious exposure; report any symptoms to [place] when noted,  If you have chicken pos, red measles or mumps, you should be immune to these diseases.   Please do not take any vaccines while pregnant unless you have checked with your OB provider.  Fetal Movement: After 28 weeks we recommend you do "kick counts" twice daily.  Lie or sit down in a calm quiet environment and count your baby movements "kicks".  You should feel your baby at least 10 times per hour.  If you have not felt 10 kicks within the first hour get up, walk around and have something sweet to eat or drink then repeat for an additional hour.  If count remains less than 10 per hour notify your provider.  Fumigating: Follow your pest control agent's advice as to how long to stay out of your home.  Ventilate the area well before re-entering.  Hemorrhoids:   Most over-the-counter preparations can be used during pregnancy.  Check your medication to see what is safe to use.  It is important to use a stool softener or fiber in your diet and to drink lots of liquids.  If hemorrhoids seem to be getting  worse please call the office.   Hot Tubs:  Hot tubs Jacuzzis and saunas are not recommended while pregnant.  These increase your internal body temperature and should be avoided.  Intercourse:  Sexual intercourse is safe during pregnancy as long as you are comfortable, unless otherwise advised by your provider.  Spotting may occur after intercourse; report any bright red bleeding that is heavier than spotting.  Labor:  If you know that you are in labor, please go to the hospital.  If you are unsure, please call the office and let us help you decide what to do.  Lifting, straining, etc:  If your job requires heavy lifting or straining  please check with your provider for any limitations.  Generally, you should not lift items heavier than that you can lift simply with your hands and arms (no back muscles)  Painting:  Paint fumes do not harm your pregnancy, but may make you ill and should be avoided if possible.  Latex or water based paints have less odor than oils.  Use adequate ventilation while painting.  Permanents & Hair Color:  Chemicals in hair dyes are not recommended as they cause increase hair dryness which can increase hair loss during pregnancy.  " Highlighting" and permanents are allowed.  Dye may be absorbed differently and permanents may not hold as well during pregnancy.  Sunbathing:  Use a sunscreen, as skin burns easily during pregnancy.  Drink plenty of fluids; avoid over heating.  Tanning Beds:  Because their possible side effects are still unknown, tanning beds are not recommended.  Ultrasound Scans:  Routine ultrasounds are performed at approximately 20 weeks.  You will be able to see your baby's general anatomy an if you would like to know the gender this can usually be determined as well.  If it is questionable when you conceived you may also receive an ultrasound early in your pregnancy for dating purposes.  Otherwise ultrasound exams are not routinely performed unless there is  a medical necessity.  Although you can request a scan we ask that you pay for it when conducted because insurance does not cover " patient request" scans.  Work: If your pregnancy proceeds without complications you may work until your due date, unless your physician or employer advises otherwise.  Round Ligament Pain/Pelvic Discomfort:  Sharp, shooting pains not associated with bleeding are fairly common, usually occurring in the second trimester of pregnancy.  They tend to be worse when standing up or when you remain standing for long periods of time.  These are the result of pressure of certain pelvic ligaments called "round ligaments".  Rest, Tylenol and heat seem to be the most effective relief.  As the womb and fetus grow, they rise out of the pelvis and the discomfort improves.  Please notify the office if your pain seems different than that described.  It may represent a more serious condition.

## 2021-05-19 LAB — URINALYSIS, ROUTINE W REFLEX MICROSCOPIC
Bilirubin, UA: NEGATIVE
Glucose, UA: NEGATIVE
Ketones, UA: NEGATIVE
Leukocytes,UA: NEGATIVE
Nitrite, UA: NEGATIVE
RBC, UA: NEGATIVE
Specific Gravity, UA: 1.024 (ref 1.005–1.030)
Urobilinogen, Ur: 1 mg/dL (ref 0.2–1.0)
pH, UA: 7.5 (ref 5.0–7.5)

## 2021-05-20 LAB — CULTURE, OB URINE

## 2021-05-20 LAB — URINE CULTURE, OB REFLEX

## 2021-05-21 LAB — VIRAL HEPATITIS HBV, HCV
HCV Ab: <0.1 s/co ratio (ref 0.0–0.9)
Hep B Core Total Ab: NEGATIVE
Hep B Surface Ab, Qual: REACTIVE
Hepatitis B Surface Ag: NEGATIVE

## 2021-05-21 LAB — PARVOVIRUS B19 ANTIBODY, IGG AND IGM
Parvovirus B19 IgG: 0.3 index (ref 0.0–0.8)
Parvovirus B19 IgM: 0.2 index (ref 0.0–0.8)

## 2021-05-21 LAB — ANTIBODY SCREEN: Antibody Screen: NEGATIVE

## 2021-05-21 LAB — ABO AND RH: Rh Factor: POSITIVE

## 2021-05-21 LAB — RUBELLA SCREEN: Rubella Antibodies, IGG: 3.88 index (ref >0.99)

## 2021-05-21 LAB — RPR: RPR Ser Ql: NONREACTIVE

## 2021-05-21 LAB — HCV INTERPRETATION

## 2021-05-21 LAB — HGB SOLU + RFLX FRAC: Sickle Solubility Test - HGBRFX: NEGATIVE

## 2021-05-21 LAB — VARICELLA ZOSTER ANTIBODY, IGG: Varicella zoster IgG: 261 index (ref >165)

## 2021-05-22 LAB — NICOTINE SCREEN, URINE

## 2021-05-22 LAB — GC/CHLAMYDIA PROBE AMP
Chlamydia trachomatis, NAA: NEGATIVE
Neisseria Gonorrhoeae by PCR: NEGATIVE

## 2021-05-25 ENCOUNTER — Other Ambulatory Visit: Payer: Self-pay | Admitting: Obstetrics and Gynecology

## 2021-05-25 DIAGNOSIS — Z3401 Encounter for supervision of normal first pregnancy, first trimester: Secondary | ICD-10-CM

## 2021-05-29 ENCOUNTER — Ambulatory Visit
Admission: RE | Admit: 2021-05-29 | Discharge: 2021-05-29 | Disposition: A | Discharge status: Home / Self Care | Payer: Managed Care, Other (non HMO) | Source: Ambulatory Visit | Attending: Obstetrics and Gynecology | Admitting: Obstetrics and Gynecology

## 2021-05-29 ENCOUNTER — Other Ambulatory Visit: Payer: Self-pay

## 2021-05-29 DIAGNOSIS — Z3401 Encounter for supervision of normal first pregnancy, first trimester: Secondary | ICD-10-CM | POA: Insufficient documentation

## 2021-06-06 ENCOUNTER — Other Ambulatory Visit: Payer: Self-pay

## 2021-06-06 ENCOUNTER — Encounter: Payer: Self-pay | Admitting: Obstetrics and Gynecology

## 2021-06-06 ENCOUNTER — Ambulatory Visit (INDEPENDENT_AMBULATORY_CARE_PROVIDER_SITE_OTHER): Payer: Managed Care, Other (non HMO) | Admitting: Obstetrics and Gynecology

## 2021-06-06 VITALS — BP 113/72 | HR 82 | Ht 66.0 in | Wt 157.9 lb

## 2021-06-06 DIAGNOSIS — E663 Overweight: Secondary | ICD-10-CM

## 2021-06-06 DIAGNOSIS — M6208 Separation of muscle (nontraumatic), other site: Secondary | ICD-10-CM

## 2021-06-06 DIAGNOSIS — Z3481 Encounter for supervision of other normal pregnancy, first trimester: Secondary | ICD-10-CM

## 2021-06-06 DIAGNOSIS — Z3A10 10 weeks gestation of pregnancy: Secondary | ICD-10-CM

## 2021-06-06 DIAGNOSIS — R110 Nausea: Secondary | ICD-10-CM

## 2021-06-06 DIAGNOSIS — O34219 Maternal care for unspecified type scar from previous cesarean delivery: Secondary | ICD-10-CM

## 2021-06-06 LAB — POCT URINALYSIS DIPSTICK OB
Bilirubin, UA: NEGATIVE
Blood, UA: NEGATIVE
Glucose, UA: NEGATIVE
Ketones, UA: NEGATIVE
Nitrite, UA: NEGATIVE
POC,PROTEIN,UA: NEGATIVE
Spec Grav, UA: <=1.005 — AB (ref 1.010–1.025)
Urobilinogen, UA: 0.2 E.U./dL
pH, UA: 7 (ref 5.0–8.0)

## 2021-06-06 MED ORDER — BONJESTA 20-20 MG PO TBCR: 1.0000 | EXTENDED_RELEASE_TABLET | Freq: Every day | ORAL | 0 refills | Status: DC

## 2021-06-06 NOTE — Progress Notes (Signed)
OBSTETRIC INITIAL PRENATAL VISIT  Subjective:    Tara Baldwin is being seen today for her first obstetrical visit.  This is not a planned pregnancy. She is a 30 y.o. G2P1001 female at [redacted]w[redacted]d gestation, Estimated Date of Delivery: 12/29/21 with Patient's last menstrual period was 03/06/2021.,  inconsistent with 9 week sono. Her obstetrical history is significant for  history of C-section x 1 . Relationship with FOB: spouse, living together. Recently got married in May. Patient does intend to breast feed. Pregnancy history fully reviewed.  Patient has the following complaints today:  Reports cramping last night. Denied vaginal bleeding. Notes it eased off after lying down on left side.  Is reporting significant nausea.  Was only experiencing at night, but now also noting it sporadically during the day. Not taking anything for it.  Has some issues with fatigue.  Has questions about her diastasis recti and management during pregnancy.    OB History  Gravida Para Term Preterm AB Living  2 1 1  0 0 1  SAB IAB Ectopic Multiple Live Births  0 0 0 0 1    # Outcome Date GA Lbr Len/2nd Weight Sex Delivery Anes PTL Lv  2 Current           1 Term 12/19/17 [redacted]w[redacted]d  7 lb 0.9 oz (3.2 kg) M CS-LTranv EPI  LIV     Name: [redacted]w[redacted]d      Apgar1: 9  Apgar5: 9    Gynecologic History:  Last pap smear was 12/18/2020.  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  Reports history of STIs: Chlamydia in 2018.  Contraception prior to conception: combined OCPs.    Past Medical History:  Diagnosis Date   Diastasis recti 08/20/2018   Headache    STD (female) 05/16/2017   chlmydia    Family History  Problem Relation Age of Onset   Hypertension Father    Breast cancer Paternal Grandmother    Ovarian cancer Neg Hx    Colon cancer Neg Hx     Past Surgical History:  Procedure Laterality Date   CESAREAN SECTION N/A 12/19/2017   Procedure: CESAREAN  SECTION;  Surgeon: 12/21/2017, MD;  Location: [facility] ORS;  Service: Obstetrics;  Laterality: N/A;    Social History   Socioeconomic History   Marital status: Married    Spouse name: Not on file   Number of children: Not on file   Years of education: Not on file   Highest education level: Not on file  Occupational History   Not on file  Tobacco Use   Smoking status: Never    Passive exposure: Past   Smokeless tobacco: Never  Vaping Use   Vaping Use: Never used  Substance and Sexual Activity   Alcohol use: Not Currently    Comment: occasional    Drug use: No   Sexual activity: Yes    Comment: Pregnant  Other Topics Concern   Not on file  Social History Narrative   Not on file   Social Determinants of Health   Financial Resource Strain: Not on file  Food Insecurity: Not on file  Transportation Needs: Not on file  Physical Activity: Not on file  Stress: Not on file  Social Connections: Not on file  Intimate Partner Violence: Not on file    Current Outpatient Medications on File Prior to Visit  Medication Sig Dispense Refill   Prenatal Vit-Fe Fumarate-FA (PRENATAL  VITAMINS) 28-0.8 MG TABS Take by mouth.     No current facility-administered medications on file prior to visit.    No Known Allergies   Review of Systems General: Not Present- Fever, Weight Loss and Weight Gain.  Positive for fatigue.  Skin: Not Present- Rash. HEENT: Not Present- Blurred Vision, Headache and Bleeding Gums. Respiratory: Not Present- Difficulty Breathing. Breast: Not Present- Breast Mass. Cardiovascular: Not Present- Chest Pain, Elevated Blood Pressure, Fainting / Blacking Out and Shortness of Breath. Gastrointestinal: Not Present- Abdominal Pain, Constipation, Vomiting. Positive for nausea.  Female Genitourinary: Not Present- Frequency, Painful Urination, Pelvic Pain, Vaginal Bleeding, Vaginal Discharge, Contractions, regular, Fetal Movements Decreased, Urinary Complaints and  Vaginal Fluid. Musculoskeletal: Not Present- Back Pain and Leg Cramps. Neurological: Not Present- Dizziness. Psychiatric: Not Present- Depression.     Objective:   Blood pressure 113/72, pulse 82, weight 157 lb 14.4 oz (71.6 kg), last menstrual period 03/06/2021.  Body mass index is 25.49 kg/m.  General Appearance:    Alert, cooperative, no distress, appears stated age. Overweight  Head:    Normocephalic, without obvious abnormality, atraumatic  Eyes:    PERRL, conjunctiva/corneas clear, EOM's intact, both eyes  Ears:    Normal external ear canals, both ears  Nose:   Nares normal, septum midline, mucosa normal, no drainage or sinus tenderness  Throat:   Lips, mucosa, and tongue normal; teeth and gums normal  Neck:   Supple, symmetrical, trachea midline, no adenopathy; thyroid: no enlargement/tenderness/nodules; no carotid bruit or JVD  Back:     Symmetric, no curvature, ROM normal, no CVA tenderness  Lungs:     Clear to auscultation bilaterally, respirations unlabored  Chest Wall:    No tenderness or deformity   Heart:    Regular rate and rhythm, S1 and S2 normal, no murmur, rub or gallop  Breast Exam:    No tenderness, masses, or nipple abnormality  Abdomen:     Soft, non-tender, bowel sounds active all four quadrants, no masses, no organomegaly.  FHT 172 bpm.  Well-healed Pfannenstiel incision.   Genitalia:    Pelvic:external genitalia normal, vagina without lesions, discharge, or tenderness, rectovaginal septum  normal. Cervix normal in appearance, no cervical motion tenderness, no adnexal masses or tenderness.  Pregnancy positive findings: uterine enlargement: 11 wk size, nontender.   Rectal:    Normal external sphincter.  No hemorrhoids appreciated. Internal exam not done.   Extremities:   Extremities normal, atraumatic, no cyanosis or edema  Pulses:   2+ and symmetric all extremities  Skin:   Skin color, texture, turgor normal, no rashes or lesions  Lymph nodes:   Cervical,  supraclavicular, and axillary nodes normal  Neurologic:   CNII-XII intact, normal strength, sensation and reflexes throughout     Assessment:   1. Encounter for supervision of other normal pregnancy in second trimester   2. [redacted] weeks gestation of pregnancy   3. History of cesarean delivery, antepartum   4. Overweight (BMI 25.0-29.9)   5. Diastasis recti   6. Nausea      Plan:   1. Encounter for supervision of other normal pregnancy in second trimester - Initial labs reviewed. - Prenatal vitamins encouraged. - Problem list reviewed and updated. - New OB counseling:  The patient has been given an overview regarding routine prenatal care.   - Prenatal testing, optional genetic testing, and ultrasound use in pregnancy were reviewed.  Traditional genetic screening vs cell-fee DNA genetic screening discussed, including risks and benefits. Testing requested (Panorama ordered). -  Benefits of Breast Feeding were discussed. The patient is encouraged to consider nursing her baby post partum.  2. History of cesarean delivery, antepartum - Discussion had regarding TOLAC vs repeat C-section.  C-section performed for arrest of dilation at 7 cm, had IOL secondary to postdates pregnancy. Patient notes that she desires to attempt TOLAC if possible.   3. Overweight (BMI 25.0-29.9) - Recommendations regarding diet, weight gain, and exercise in pregnancy were given.  Patient notes that she gained over 50 lbs in her last pregnancy, does not desire to do that again.   4. Diastasis recti - Patient has moderate diastasis.  Noted after first pregnancy. Engaged in physical therapy and exercising and improved. Is concerned about recurrence. Discussed management during and after pregnancy.   5. Nausea - Discussed OTC management options. Also will prescribe Bonjesta.     Follow up in 4 weeks.    Hildred Laser, MD [facility]

## 2021-06-06 NOTE — Patient Instructions (Signed)
Influenza (Flu) Vaccine (Inactivated or Recombinant): What You Need to Know 1. Why get vaccinated? Influenza vaccine can prevent influenza (flu). Flu is a contagious disease that spreads around the [place] every year, usually between October and May. Anyone can get the flu, but it is more dangerous for some people. Infants and young children, people 42 years and older, pregnant people, and people with certain health conditions or a weakened immune system are at greatest risk of flu complications. Pneumonia, bronchitis, sinus infections, and ear infections are examples of flu-related complications. If you have a medical condition, such as heart disease, cancer, or diabetes, flu can make it worse. Flu can cause fever and chills, sore throat, muscle aches, fatigue, cough, headache, and runny or stuffy nose. Some people may have vomiting and diarrhea, though this is more common in children than adults. In an average year, thousands of people in the [place] die from flu, and many more are hospitalized. Flu vaccine prevents millions of illnesses and flu-related visits to the doctor each year. 2. Influenza vaccines CDC recommends everyone 6 months and older get vaccinated every flu season. Children 6 months through 44 years of age may need 2 doses during a single flu season. Everyone else needs only 1 dose each flu season. It takes about 2 weeks for protection to develop after vaccination. There are many flu viruses, and they are always changing. Each year a new flu vaccine is made to protect against the influenza viruses believed to be likely to cause disease in the upcoming flu season. Even when the vaccine doesn't exactly match these viruses, it may still provide some protection. Influenza vaccine does not cause flu. Influenza vaccine may be given at the same time as other vaccines. 3. Talk with your health care provider Tell your vaccination provider if the person getting the vaccine: Has had  an allergic reaction after a previous dose of influenza vaccine, or has any severe, life-threatening allergies Has ever had Guillain-Barr Syndrome (also called "GBS") In some cases, your health care provider may decide to postpone influenza vaccination until a future visit. Influenza vaccine can be administered at any time during pregnancy. People who are or will be pregnant during influenza season should receive inactivated influenza vaccine. People with minor illnesses, such as a cold, may be vaccinated. People who are moderately or severely ill should usually wait until they recover before getting influenza vaccine. Your health care provider can give you more information. 4. Risks of a vaccine reaction Soreness, redness, and swelling where the shot is given, fever, muscle aches, and headache can happen after influenza vaccination. There may be a very small increased risk of Guillain-Barr Syndrome (GBS) after inactivated influenza vaccine (the flu shot). Young children who get the flu shot along with pneumococcal vaccine (PCV13) and/or DTaP vaccine at the same time might be slightly more likely to have a seizure caused by fever. Tell your health care provider if a child who is getting flu vaccine has ever had a seizure. People sometimes faint after medical procedures, including vaccination. Tell your provider if you feel dizzy or have vision changes or ringing in the ears. As with any medicine, there is a very remote chance of a vaccine causing a severe allergic reaction, other serious injury, or death. 5. What if there is a serious problem? An allergic reaction could occur after the vaccinated person leaves the clinic. If you see signs of a severe allergic reaction (hives, swelling of the face and throat, difficulty breathing,  a fast heartbeat, dizziness, or weakness), call 9-1-1 and get the person to the nearest hospital. For other signs that concern you, call your health care provider. Adverse  reactions should be reported to the Vaccine Adverse Event Reporting System (VAERS). Your health care provider will usually file this report, or you can do it yourself. Visit the VAERS website at www.vaers.SamedayNews.es or call 337-672-7965. VAERS is only for reporting reactions, and VAERS staff members do not give medical advice. 6. The National Vaccine Injury Compensation Program The [employer] Vaccine Injury Compensation Program (VICP) is a federal program that was created to compensate people who may have been injured by certain vaccines. Claims regarding alleged injury or death due to vaccination have a time limit for filing, which may be as short as two years. Visit the VICP website at GoldCloset.com.ee or call 986-042-3734 to learn about the program and about filing a claim. 7. How can I learn more? Ask your health care provider. Call your local or state health department. Visit the website of the Food and Drug Administration (FDA) for vaccine package inserts and additional information at TraderRating.uy. Contact the Centers for Disease Control and Prevention (CDC): Call 8707946844 (1-800-CDC-INFO) or Visit CDC's website at https://gibson.com/. Vaccine Information Statement Inactivated Influenza Vaccine (05/12/2020) This information is not intended to replace advice given to you by your health care provider. Make sure you discuss any questions you have with your health care provider. Document Revised: 06/29/2020 Document Reviewed: 06/29/2020 Elsevier Patient Education  2022 [employer]. Tests and Screening During Pregnancy Having certain tests and screenings during pregnancy is an important part of your prenatal care. These tests help your health care provider find problems that might affect your pregnancy. Some tests must be done for all pregnant women, and some are optional. Most of the tests and screenings do not pose any risks for you or your baby.  You may need additional testing if any routine tests indicate a problem. Tests and screenings done early in pregnancy Some tests and screenings you can expect to have in early pregnancy include: Blood tests, such as: Complete blood count (CBC). This test is done to check your red and white blood cells. It can help identify a risk for anemia, infection, or bleeding. Blood typing. This test shows your blood type. It also shows whether you have a certain protein in your red blood cells called the Rh factor. It can be dangerous for your baby if you do not have this protein (Rh negative) and your baby has it (Rh positive). Tests to check for diseases that can cause birth defects or can be passed to your baby, such as: [place] measles (rubella) and chicken pox. The test indicates whether you are immune to these diseases. Hepatitis B and C. Human Immunodeficiency Virus (HIV). Syphilis. Zika virus, if you or your partner has traveled to an area where the virus occurs. Urine testing. This checks for sugar in your urine and for signs of infection. Blood pressure. This is to check for high blood pressure and preeclampsia. Testing for sexually transmitted infections (STIs), such as chlamydia or gonorrhea. Testing for tuberculosis. You may have this skin test if you are at risk for tuberculosis. Fetal ultrasound. This is an imaging study of your growing baby. It uses sound waves to create pictures of your baby. This test may be done to help determine your due date and to ensure you do not have an ectopic pregnancy. An ectopic pregnancy is a pregnancy that grows outside of the  uterus. Tests and screenings done later in pregnancy Certain tests are done for the first time later in the pregnancy. Some of the tests that were done in early pregnancy are repeated at this time. Some common tests you can expect to have later in pregnancy include: Rh antibody testing. If you are Rh negative, you will have a blood test at  about 28 weeks of pregnancy to see if you are producing Rh antibodies. If you have not started to make antibodies, you will be given an injection to prevent you from making antibodies for the rest of your pregnancy. Glucose screening. This checks your blood sugar. It will show whether you are developing the type of diabetes that occurs during pregnancy (gestational diabetes). You may have this screening earlier if you have risk factors for diabetes. Screening for group B streptococcus (GBS). GBS is a type of bacteria that may live in your rectum or vagina. GBS can spread to your baby during birth. This is done at 35-37 weeks of pregnancy. If testing is positive for GBS, you may be treated with antibiotic medicine. Urine and blood tests to monitor for other pregnancy problems, such as preeclampsia or anemia. Blood pressure to monitor for high blood pressure and preeclampsia. Fetal ultrasound. This may be repeated at 16-20 weeks to check how your baby is growing and developing. Non-stress test. This test is done later in pregnancy to check your baby's heart rate. This may be repeated weekly if your pregnancy is high risk. Biophysical profile. This test includes ultrasound imaging and a non-stress test to ensure your baby is healthy. This test may help decide when your baby should be born. Screening for birth defects Some birth defects are caused by abnormal genes passed down through families. Early in your pregnancy, tests can be done to find out if your baby is at risk for a genetic disorder. This testing is optional. The type of testing recommended for you will depend on your family and medical history, your ethnicity, and your age. Testing may include: Screening tests. These tests may include an ultrasound, blood tests, or a combination of both. The blood tests are used to check for abnormal genes and the ultrasound is done to look for early birth defects. Carrier screening. This test involves checking  the blood or saliva of both parents to see if they carry abnormal genes that could be passed down to a baby. If genetic screening shows that your baby is at risk for a genetic defect, additional diagnostic testing may be recommended, such as: Amniocentesis. This involves testing a sample of fluid from your womb (amniotic fluid). Chorionic villus sampling. In this test, a sample of cells from your placenta is checked for abnormal cells. Unlike other tests done during pregnancy, diagnostic testing does have some risk for your pregnancy. Talk to your health care provider about the risks and benefits of genetic testing. Questions to ask your health care provider What routine tests are recommended for me? When and how will these tests be done? When will I get the results of routine tests? What do the results of these tests mean for me or my baby? Do you recommend any genetic screening tests? Which ones? Should I see a genetic counselor before having genetic screening? Where to find more information American Pregnancy Association: americanpregnancy.org/prenatal-testing [employer]st[employer]ians [employer] Gynecologists: JewelryExec.com.pt Office on [employer] Health: KeywordPortfolios.com.br March of Dimes: marchofdimes.org/pregnancy Summary Having certain tests and screenings during pregnancy is an important part of your prenatal care.  Talk to your health care provider about what tests are right for you and your baby. In early pregnancy, testing may be done to check your risks for various conditions that can affect you and your baby. Later in pregnancy, tests may be done to ensure that your baby is growing normally and that you and your baby are staying healthy during the pregnancy. Genetic testing is optional. Talk to your health care provider about the risks and benefits of genetic testing. This information is not intended to replace advice given to you by your health care provider. Make sure you  discuss any questions you have with your health care provider. Document Revised: 06/13/2020 Document Reviewed: 06/13/2020 Elsevier Patient Education  [employer]. Common Medications Safe in Pregnancy  Acne:      Constipation:  Benzoyl Peroxide     Colace  Clindamycin      Dulcolax Suppository  Topica Erythromycin     Fibercon  Salicylic Acid      Metamucil         Miralax AVOID:        Senakot   Accutane    Cough:  Retin-A       Cough Drops  Tetracycline      Phenergan w/ Codeine if Rx  Minocycline      Robitussin (Plain & DM)  Antibiotics:     Crabs/Lice:  Ceclor       RID  Cephalosporins    AVOID:  E-Mycins      Kwell  Keflex  Macrobid/Macrodantin   Diarrhea:  Penicillin      Kao-Pectate  Zithromax      Imodium AD         PUSH FLUIDS AVOID:       Cipro     Fever:  Tetracycline      Tylenol (Regular or Extra  Minocycline       Strength)  Levaquin      Extra Strength-Do not          Exceed 8 tabs/24 hrs Caffeine:        '200mg'$ /day (equiv. To 1 cup of coffee or  approx. 3 12 oz sodas)         Gas: Cold/Hayfever:       Gas-X  Benadryl      Mylicon  Claritin       Phazyme  **Claritin-D        Chlor-Trimeton    Headaches:  Dimetapp      ASA-Free Excedrin  Drixoral-Non-Drowsy     Cold Compress  Mucinex (Guaifenasin)     Tylenol (Regular or Extra  Sudafed/Sudafed-12 Hour     Strength)  **Sudafed PE Pseudoephedrine   Tylenol Cold & Sinus     Vicks Vapor Rub  Zyrtec  **AVOID if Problems With Blood Pressure         Heartburn: Avoid lying down for at least 1 hour after meals  Aciphex      Maalox     Rash:  Milk of Magnesia     Benadryl    Mylanta       1% Hydrocortisone Cream  Pepcid  Pepcid Complete   Sleep Aids:  Prevacid      Ambien   Prilosec       Benadryl  Rolaids       Chamomile Tea  Tums (Limit 4/day)     Unisom         Tylenol PM  Warm milk-add vanilla or  Hemorrhoids:       Sugar for taste  Anusol/Anusol H.C.  (RX: Analapram  2.5%)  Sugar Substitutes:  Hydrocortisone OTC     Ok in moderation  Preparation H      Tucks        Vaseline lotion applied to tissue with wiping    Herpes:     Throat:  Acyclovir      Oragel  Famvir  Valtrex     Vaccines:         Flu Shot Leg Cramps:       *Gardasil  Benadryl      Hepatitis A         Hepatitis B Nasal Spray:       Pneumovax  Saline Nasal Spray     Polio Booster         Tetanus Nausea:       Tuberculosis test or PPD  Vitamin B6 25 mg TID   AVOID:    Dramamine      *Gardasil  Emetrol       Live Poliovirus  Ginger Root 250 mg QID    MMR (measles, mumps &  High Complex Carbs @ Bedtime    rebella)  Sea Bands-Accupressure    Varicella (Chickenpox)  Unisom 1/2 tab TID     *No known complications           If received before Pain:         Known pregnancy;   Darvocet       Resume series after  Lortab        Delivery  Percocet    Yeast:   Tramadol      Femstat  Tylenol 3      Gyne-lotrimin  Ultram       Monistat  Vicodin           MISC:         All Sunscreens           Hair Coloring/highlights          Insect Repellant's          (Including DEET)         Mystic Tans

## 2021-06-07 LAB — CBC
Hematocrit: 36.3 % (ref 34.0–46.6)
Hemoglobin: 12.2 g/dL (ref 11.1–15.9)
MCH: 29.8 pg (ref 26.6–33.0)
MCHC: 33.6 g/dL (ref 31.5–35.7)
MCV: 89 fL (ref 79–97)
Platelets: 199 10*3/uL (ref 150–450)
RBC: 4.1 x10E6/uL (ref 3.77–5.28)
RDW: 12.3 % (ref 11.7–15.4)
WBC: 4.8 10*3/uL (ref 3.4–10.8)

## 2021-06-18 ENCOUNTER — Telehealth: Payer: Self-pay

## 2021-06-18 NOTE — Telephone Encounter (Signed)
Pt is aware that her genetic testing has been completed and she is having a healthy baby boy.

## 2021-06-28 ENCOUNTER — Other Ambulatory Visit: Payer: Self-pay

## 2021-06-28 ENCOUNTER — Ambulatory Visit (INDEPENDENT_AMBULATORY_CARE_PROVIDER_SITE_OTHER): Payer: Managed Care, Other (non HMO) | Admitting: Obstetrics and Gynecology

## 2021-06-28 VITALS — BP 113/71 | HR 60 | Wt 161.8 lb

## 2021-06-28 DIAGNOSIS — R42 Dizziness and giddiness: Secondary | ICD-10-CM

## 2021-06-28 DIAGNOSIS — Z3482 Encounter for supervision of other normal pregnancy, second trimester: Secondary | ICD-10-CM

## 2021-06-28 DIAGNOSIS — R1084 Generalized abdominal pain: Secondary | ICD-10-CM | POA: Diagnosis not present

## 2021-06-28 DIAGNOSIS — Z3A13 13 weeks gestation of pregnancy: Secondary | ICD-10-CM

## 2021-06-28 DIAGNOSIS — O26891 Other specified pregnancy related conditions, first trimester: Secondary | ICD-10-CM

## 2021-06-28 DIAGNOSIS — E162 Hypoglycemia, unspecified: Secondary | ICD-10-CM

## 2021-06-28 LAB — POCT URINALYSIS DIPSTICK OB
Bilirubin, UA: NEGATIVE
Blood, UA: NEGATIVE
Glucose, UA: NEGATIVE
Ketones, UA: NEGATIVE
Leukocytes, UA: NEGATIVE
Nitrite, UA: NEGATIVE
Spec Grav, UA: 1.02 (ref 1.010–1.025)
Urobilinogen, UA: 0.2 E.U./dL
pH, UA: 6.5 (ref 5.0–8.0)

## 2021-06-28 NOTE — Progress Notes (Signed)
HPI:      Ms. Tara Baldwin is a 30 y.o. G2P1001 who LMP was Patient's last menstrual period was 03/06/2021.  Subjective:   She presents today approximately 13 weeks estimated gestational age.  With 3 main complaints.  #1 she experienced a bout of generalized abdominal pain -all over that resolved within an hour.  She says it was similar to her previous "growing pains" with her last pregnancy.  She has not had it since that time.   Her second issue is known diastases recti from a previous pregnancy.  She says it is starting to occur again and has some discomfort from that.  She says that she already has a belly band on order. Her third issue is that she experienced weakness in her legs and lightheadedness while standing at work.  This quickly resolved but was somewhat unsettling.    Hx: The following portions of the patient's history were reviewed and updated as appropriate:             She  has a past medical history of Diastasis recti (08/20/2018), Headache, and STD (female) (05/16/2017). She does not have any pertinent problems on file. She  has a past surgical history that includes Cesarean section (N/A, 12/19/2017). Her family history includes Breast cancer in her paternal grandmother; Hypertension in her father. She  reports that she has never smoked. She has been exposed to tobacco smoke. She has never used smokeless tobacco. She reports that she does not currently use alcohol. She reports that she does not use drugs. She has a current medication list which includes the following prescription(s): bonjesta and prenatal vitamins. She has No Known Allergies.       Review of Systems:  Review of Systems  Constitutional: Denied constitutional symptoms, night sweats, recent illness, fatigue, fever, insomnia and weight loss.  Eyes: Denied eye symptoms, eye pain, photophobia, vision change and visual disturbance.  Ears/Nose/Throat/Neck: Denied ear, nose, throat or neck symptoms, hearing loss,  nasal discharge, sinus congestion and sore throat.  Cardiovascular: Denied cardiovascular symptoms, arrhythmia, chest pain/pressure, edema, exercise intolerance, orthopnea and palpitations.  Respiratory: Denied pulmonary symptoms, asthma, pleuritic pain, productive sputum, cough, dyspnea and wheezing.  Gastrointestinal: Denied, gastro-esophageal reflux, melena, nausea and vomiting.  Genitourinary: Denied genitourinary symptoms including symptomatic vaginal discharge, pelvic relaxation issues, and urinary complaints.  Musculoskeletal: Denied musculoskeletal symptoms, stiffness, swelling, muscle weakness and myalgia.  Dermatologic: Denied dermatology symptoms, rash and scar.  Neurologic: Denied neurology symptoms, dizziness, headache, neck pain and syncope.  Psychiatric: Denied psychiatric symptoms, anxiety and depression.  Endocrine: Denied endocrine symptoms including hot flashes and night sweats.   Meds:   Current Outpatient Medications on File Prior to Visit  Medication Sig Dispense Refill   Doxylamine-Pyridoxine ER (BONJESTA) 20-20 MG TBCR Take 1 tablet by mouth daily. 60 tablet 0   Prenatal Vit-Fe Fumarate-FA (PRENATAL VITAMINS) 28-0.8 MG TABS Take by mouth.     No current facility-administered medications on file prior to visit.      Objective:     Vitals:   06/28/21 1050  BP: 113/71  Pulse: 60   Filed Weights   06/28/21 1050  Weight: 161 lb 12.8 oz (73.4 kg)              Urine is negative.          Assessment:    G2P1001 Patient Active Problem List   Diagnosis Date Noted   Diastasis recti 08/20/2018   History of C-section 12/30/2017   Left flank pain  12/14/2017   Breast lump on right side at 11 o'clock position 10/09/2017     1. Encounter for supervision of other normal pregnancy, second trimester   2. [redacted] weeks gestation of pregnancy   3. Generalized abdominal pain   4. Lightheadedness   5. Hypoglycemia        Plan:            1.  Discussed use of  belly band-patient has 1 on order  2.  Discussed hypoglycemia and methods of alleviation including more frequent caloric intake as well as food that provide a sustained amount of calories.  Milk, toast, peanut butter crackers, pasta.  3.  Continued efforts at increasing hydration during pregnancy. Orders Orders Placed This Encounter  Procedures   POC Urinalysis Dipstick OB    No orders of the defined types were placed in this encounter.     F/U  No follow-ups on file. I spent 22 minutes involved in the care of this patient preparing to see the patient by obtaining and reviewing her medical history (including labs, imaging tests and prior procedures), documenting clinical information in the electronic health record (EHR), counseling and coordinating care plans, writing and sending prescriptions, ordering tests or procedures and in direct communicating with the patient and medical staff discussing pertinent items from her history and physical exam.  Elonda Husky, M.D. 06/28/2021 11:26 AM

## 2021-07-04 ENCOUNTER — Other Ambulatory Visit: Payer: Self-pay

## 2021-07-04 ENCOUNTER — Encounter: Payer: Self-pay | Admitting: Obstetrics and Gynecology

## 2021-07-04 ENCOUNTER — Ambulatory Visit (INDEPENDENT_AMBULATORY_CARE_PROVIDER_SITE_OTHER): Payer: Managed Care, Other (non HMO) | Admitting: Obstetrics and Gynecology

## 2021-07-04 VITALS — BP 113/73 | HR 68 | Wt 161.8 lb

## 2021-07-04 DIAGNOSIS — Z3482 Encounter for supervision of other normal pregnancy, second trimester: Secondary | ICD-10-CM

## 2021-07-04 DIAGNOSIS — Z3A14 14 weeks gestation of pregnancy: Secondary | ICD-10-CM

## 2021-07-04 DIAGNOSIS — R1084 Generalized abdominal pain: Secondary | ICD-10-CM

## 2021-07-04 LAB — POCT URINALYSIS DIPSTICK OB
Blood, UA: NEGATIVE
Glucose, UA: NEGATIVE
Ketones, UA: NEGATIVE
Leukocytes, UA: NEGATIVE
Nitrite, UA: NEGATIVE
POC,PROTEIN,UA: NEGATIVE
Spec Grav, UA: 1.01 (ref 1.010–1.025)
Urobilinogen, UA: 0.2 E.U./dL
pH, UA: 7 (ref 5.0–8.0)

## 2021-07-04 NOTE — Progress Notes (Signed)
ROB: Patient continues to have intermittent midline abdominal pain.  She states that she has had this for "many years".  She wakes up with it in the morning.  It is not related to bowel movements urination or after meals.  Pain is not crampy she says it remains constant.  Gallbladder ultrasound ordered.  Additionally she has gotten a belly band and has made a big difference in her pelvic discomfort.  aFP next visit schedule anatomy scan for following visit.

## 2021-07-04 NOTE — Progress Notes (Signed)
OB-pt present for routine prenatal care. Pt stated having a lot of abd pain and pressure.

## 2021-07-04 NOTE — Patient Instructions (Signed)
Influenza (Flu) Vaccine (Inactivated or Recombinant): What You Need to Know 1. Why get vaccinated? Influenza vaccine can prevent influenza (flu). Flu is a contagious disease that spreads around the [place] every year, usually between October and May. Anyone can get the flu, but it is more dangerous for some people. Infants and young children, people 42 years and older, pregnant people, and people with certain health conditions or a weakened immune system are at greatest risk of flu complications. Pneumonia, bronchitis, sinus infections, and ear infections are examples of flu-related complications. If you have a medical condition, such as heart disease, cancer, or diabetes, flu can make it worse. Flu can cause fever and chills, sore throat, muscle aches, fatigue, cough, headache, and runny or stuffy nose. Some people may have vomiting and diarrhea, though this is more common in children than adults. In an average year, thousands of people in the [place] die from flu, and many more are hospitalized. Flu vaccine prevents millions of illnesses and flu-related visits to the doctor each year. 2. Influenza vaccines CDC recommends everyone 6 months and older get vaccinated every flu season. Children 6 months through 44 years of age may need 2 doses during a single flu season. Everyone else needs only 1 dose each flu season. It takes about 2 weeks for protection to develop after vaccination. There are many flu viruses, and they are always changing. Each year a new flu vaccine is made to protect against the influenza viruses believed to be likely to cause disease in the upcoming flu season. Even when the vaccine doesn't exactly match these viruses, it may still provide some protection. Influenza vaccine does not cause flu. Influenza vaccine may be given at the same time as other vaccines. 3. Talk with your health care provider Tell your vaccination provider if the person getting the vaccine: Has had  an allergic reaction after a previous dose of influenza vaccine, or has any severe, life-threatening allergies Has ever had Guillain-Barr Syndrome (also called "GBS") In some cases, your health care provider may decide to postpone influenza vaccination until a future visit. Influenza vaccine can be administered at any time during pregnancy. People who are or will be pregnant during influenza season should receive inactivated influenza vaccine. People with minor illnesses, such as a cold, may be vaccinated. People who are moderately or severely ill should usually wait until they recover before getting influenza vaccine. Your health care provider can give you more information. 4. Risks of a vaccine reaction Soreness, redness, and swelling where the shot is given, fever, muscle aches, and headache can happen after influenza vaccination. There may be a very small increased risk of Guillain-Barr Syndrome (GBS) after inactivated influenza vaccine (the flu shot). Young children who get the flu shot along with pneumococcal vaccine (PCV13) and/or DTaP vaccine at the same time might be slightly more likely to have a seizure caused by fever. Tell your health care provider if a child who is getting flu vaccine has ever had a seizure. People sometimes faint after medical procedures, including vaccination. Tell your provider if you feel dizzy or have vision changes or ringing in the ears. As with any medicine, there is a very remote chance of a vaccine causing a severe allergic reaction, other serious injury, or death. 5. What if there is a serious problem? An allergic reaction could occur after the vaccinated person leaves the clinic. If you see signs of a severe allergic reaction (hives, swelling of the face and throat, difficulty breathing,  a fast heartbeat, dizziness, or weakness), call 9-1-1 and get the person to the nearest hospital. For other signs that concern you, call your health care provider. Adverse  reactions should be reported to the Vaccine Adverse Event Reporting System (VAERS). Your health care provider will usually file this report, or you can do it yourself. Visit the VAERS website at www.vaers.SamedayNews.es or call (564)795-1998. VAERS is only for reporting reactions, and VAERS staff members do not give medical advice. 6. The National Vaccine Injury Compensation Program The [employer] Vaccine Injury Compensation Program (VICP) is a federal program that was created to compensate people who may have been injured by certain vaccines. Claims regarding alleged injury or death due to vaccination have a time limit for filing, which may be as short as two years. Visit the VICP website at GoldCloset.com.ee or call 779 229 6312 to learn about the program and about filing a claim. 7. How can I learn more? Ask your health care provider. Call your local or state health department. Visit the website of the Food and Drug Administration (FDA) for vaccine package inserts and additional information at TraderRating.uy. Contact the Centers for Disease Control and Prevention (CDC): Call 779-657-1216 (1-800-CDC-INFO) or Visit CDC's website at https://gibson.com/. Vaccine Information Statement Inactivated Influenza Vaccine (05/12/2020) This information is not intended to replace advice given to you by your health care provider. Make sure you discuss any questions you have with your health care provider. Document Revised: 06/29/2020 Document Reviewed: 06/29/2020 Elsevier Patient Education  2022 [place] of Pregnancy The second trimester of pregnancy is from week 13 through week 27. This is also called months 4 through 6 of pregnancy. This is often the time when you feel your best. During the second trimester: Morning sickness is less or has stopped. You may have more energy. You may feel hungry more often. At this time, your unborn baby (fetus) is  growing very fast. At the end of the sixth month, the unborn baby may be up to 12 inches long and weigh about 1 pounds. You will likely start to feel the baby move between 16 and 20 weeks of pregnancy. Body changes during your second trimester Your body continues to go through many changes during this time. The changes vary and generally return to normal after the baby is born. Physical changes You will gain more weight. You may start to get stretch marks on your hips, belly (abdomen), and breasts. Your breasts will grow and may hurt. Dark spots or blotches may develop on your face. A dark line from your belly button to the pubic area (linea nigra) may appear. You may have changes in your hair. Health changes You may have headaches. You may have heartburn. You may have trouble pooping (constipation). You may have hemorrhoids or swollen, bulging veins (varicose veins). Your gums may bleed. You may pee (urinate) more often. You may have back pain. Follow these instructions at home: Medicines Take over-the-counter and prescription medicines only as told by your doctor. Some medicines are not safe during pregnancy. Take a prenatal vitamin that contains at least 600 micrograms (mcg) of folic acid. Eating and drinking Eat healthy meals that include: Fresh fruits and vegetables. Whole grains. Good sources of protein, such as meat, eggs, or tofu. Low-fat dairy products. Avoid raw meat and unpasteurized juice, milk, and cheese. You may need to take these actions to prevent or treat trouble pooping: Drink enough fluids to keep your pee (urine) pale yellow. Eat foods that are high in fiber.  These include beans, whole grains, and fresh fruits and vegetables. Limit foods that are high in fat and sugar. These include fried or sweet foods. Activity Exercise only as told by your doctor. Most people can do their usual exercise during pregnancy. Try to exercise for 30 minutes at least 5 days a  week. Stop exercising if you have pain or cramps in your belly or lower back. Do not exercise if it is too hot or too humid, or if you are in a place of great height (high altitude). Avoid heavy lifting. If you choose to, you may have sex unless your doctor tells you not to. Relieving pain and discomfort Wear a good support bra if your breasts are sore. Take warm water baths (sitz baths) to soothe pain or discomfort caused by hemorrhoids. Use hemorrhoid cream if your doctor approves. Rest with your legs raised (elevated) if you have leg cramps or low back pain. If you develop bulging veins in your legs: Wear support hose as told by your doctor. Raise your feet for 15 minutes, 3-4 times a day. Limit salt in your food. Safety Wear your seat belt at all times when you are in a car. Talk with your doctor if someone is hurting you or yelling at you a lot. Lifestyle Do not use hot tubs, steam rooms, or saunas. Do not douche. Do not use tampons or scented sanitary pads. Avoid cat litter boxes and soil used by cats. These carry germs that can harm your baby and can cause a loss of your baby by miscarriage or stillbirth. Do not use herbal medicines, illegal drugs, or medicines that are not approved by your doctor. Do not drink alcohol. Do not smoke or use any products that contain nicotine or tobacco. If you need help quitting, ask your doctor. General instructions Keep all follow-up visits. This is important. Ask your doctor about local prenatal classes. Ask your doctor about the right foods to eat or for help finding a counselor. Where to find more information American Pregnancy Association: americanpregnancy.org [employer]st[employer]ians [employer] Gynecologists: www.acog.org Office on [employer]: KeywordPortfolios.com.br Contact a doctor if: You have a headache that does not go away when you take medicine. You have changes in how you see, or you see spots in front of your eyes. You  have mild cramps, pressure, or pain in your lower belly. You continue to feel like you may vomit (nauseous), you vomit, or you have watery poop (diarrhea). You have bad-smelling fluid coming from your vagina. You have pain when you pee or your pee smells bad. You have very bad swelling of your face, hands, ankles, feet, or legs. You have a fever. Get help right away if: You are leaking fluid from your vagina. You have spotting or bleeding from your vagina. You have very bad belly cramping or pain. You have trouble breathing. You have chest pain. You faint. You have not felt your baby move for the time period told by your doctor. You have new or increased pain, swelling, or redness in an arm or leg. Summary The second trimester of pregnancy is from week 13 through week 27 (months 4 through 6). Eat healthy meals. Exercise as told by your doctor. Most people can do their usual exercise during pregnancy. Do not use herbal medicines, illegal drugs, or medicines that are not approved by your doctor. Do not drink alcohol. Call your doctor if you get sick or if you notice anything unusual about your pregnancy. This information  is not intended to replace advice given to you by your health care provider. Make sure you discuss any questions you have with your health care provider. Document Revised: 03/01/2020 Document Reviewed: 01/06/2020 Elsevier Patient Education  [place]. Common Medications Safe in Pregnancy  Acne:      Constipation:  Benzoyl Peroxide     Colace  Clindamycin      Dulcolax Suppository  Topica Erythromycin     Fibercon  Salicylic Acid      Metamucil         Miralax AVOID:        Senakot   Accutane    Cough:  Retin-A       Cough Drops  Tetracycline      Phenergan w/ Codeine if Rx  Minocycline      Robitussin (Plain &  DM)  Antibiotics:     Crabs/Lice:  Ceclor       RID  Cephalosporins    AVOID:  E-Mycins      Kwell  Keflex  Macrobid/Macrodantin   Diarrhea:  Penicillin      Kao-Pectate  Zithromax      Imodium AD         PUSH FLUIDS AVOID:       Cipro     Fever:  Tetracycline      Tylenol (Regular or Extra  Minocycline       Strength)  Levaquin      Extra Strength-Do not          Exceed 8 tabs/24 hrs Caffeine:        '200mg'$ /day (equiv. To 1 cup of coffee or  approx. 3 12 oz sodas)         Gas: Cold/Hayfever:       Gas-X  Benadryl      Mylicon  Claritin       Phazyme  **Claritin-D        Chlor-Trimeton    Headaches:  Dimetapp      ASA-Free Excedrin  Drixoral-Non-Drowsy     Cold Compress  Mucinex (Guaifenasin)     Tylenol (Regular or Extra  Sudafed/Sudafed-12 Hour     Strength)  **Sudafed PE Pseudoephedrine   Tylenol Cold & Sinus     Vicks Vapor Rub  Zyrtec  **AVOID if Problems With Blood Pressure         Heartburn: Avoid lying down for at least 1 hour after meals  Aciphex      Maalox     Rash:  Milk of Magnesia     Benadryl    Mylanta       1% Hydrocortisone Cream  Pepcid  Pepcid Complete   Sleep Aids:  Prevacid      Ambien   Prilosec       Benadryl  Rolaids       Chamomile Tea  Tums (Limit 4/day)     Unisom         Tylenol PM         Warm milk-add vanilla or  Hemorrhoids:       Sugar for taste  Anusol/Anusol H.C.  (RX: Analapram 2.5%)  Sugar Substitutes:  Hydrocortisone OTC     Ok in moderation  Preparation H      Tucks        Vaseline lotion applied to tissue with wiping    Herpes:     Throat:  Acyclovir      Oragel  Famvir  Valtrex     Vaccines:  Flu Shot Leg Cramps:       *Gardasil  Benadryl      Hepatitis A         Hepatitis B Nasal Spray:       Pneumovax  Saline Nasal Spray     Polio Booster         Tetanus Nausea:       Tuberculosis test or PPD  Vitamin B6 25 mg TID   AVOID:    Dramamine      *Gardasil  Emetrol       Live Poliovirus  Ginger  Root 250 mg QID    MMR (measles, mumps &  High Complex Carbs @ Bedtime    rebella)  Sea Bands-Accupressure    Varicella (Chickenpox)  Unisom 1/2 tab TID     *No known complications           If received before Pain:         Known pregnancy;   Darvocet       Resume series after  Lortab        Delivery  Percocet    Yeast:   Tramadol      Femstat  Tylenol 3      Gyne-lotrimin  Ultram       Monistat  Vicodin           MISC:         All Sunscreens           Hair Coloring/highlights          Insect Repellant's          (Including DEET)         Mystic Tans

## 2021-07-10 ENCOUNTER — Other Ambulatory Visit: Payer: Managed Care, Other (non HMO)

## 2021-07-11 ENCOUNTER — Encounter: Payer: Self-pay | Admitting: Family Medicine

## 2021-07-11 ENCOUNTER — Other Ambulatory Visit: Payer: Self-pay

## 2021-07-11 ENCOUNTER — Ambulatory Visit: Payer: Managed Care, Other (non HMO) | Admitting: Family Medicine

## 2021-07-11 VITALS — BP 104/61 | HR 72 | Ht 63.0 in | Wt 163.4 lb

## 2021-07-11 DIAGNOSIS — K5901 Slow transit constipation: Secondary | ICD-10-CM | POA: Diagnosis not present

## 2021-07-11 MED ORDER — POLYETHYLENE GLYCOL 3350 17 GM/SCOOP PO POWD: 17.0000 g | Freq: Every day | ORAL | 2 refills | Status: DC | PRN

## 2021-07-11 NOTE — Patient Instructions (Addendum)
Thank you for coming to the office today.  For Constipation (less frequent bowel movement that can be hard dry or involve straining).  Recommend trying OTC Miralax 17g = 1 capful in large glass water once daily for now, try several days to see if working, goal is soft stool or BM 1-2 times daily, if too loose then reduce dose or try every other day. If not effective may need to increase it to 2 doses at once in AM or may do 1 in morning and 1 in afternoon/evening  - This medicine is very safe and can be used often without any problem and will not make you dehydrated. It is good for use on AS NEEDED BASIS or even MAINTENANCE therapy for longer term for several days to weeks at a time to help regulate bowel movements  Other more natural remedies or preventative treatment: - Increase hydration with water - Increase fiber in diet (high fiber foods = vegetables, leafy greens, oats/grains) - May take OTC Fiber supplement (metamucil powder or pill/gummy) - May try OTC Probiotic   Please schedule a Follow-up Appointment to: Return if symptoms worsen or fail to improve.  If you have any other questions or concerns, please feel free to call the office or send a message through MyChart. You may also schedule an earlier appointment if necessary.  Additionally, you may be receiving a survey about your experience at our office within a few days to 1 week by e-mail or mail. We value your feedback.  Saralyn Pilar, DO [facility], [place]

## 2021-07-11 NOTE — Progress Notes (Signed)
Subjective:    Patient ID: Tara Baldwin, female    DOB: 07-Jul-1991, 30 y.o.   MRN: [id number]  Tara Baldwin is a 30 y.o. female presenting on 07/11/2021 for Abdominal Pain   HPI  Chronic Episodic Abdominal Pain Constipation Reports chronic history of several years, approximately 5 years ago. She noticed issues with abdominal pains that would occur after college. She describes abdominal pain that would occur when wake up in the morning. Usually worse after empty bladder in AM after urination. Seems to be an episodic issue that would bother her in AM and then would eventually resolve after urination, initially get worse then resolve about 10 min after. It was not occurring every day. - Now seems to be worsening with more frequent occurrence, in past 1 month seems to occur almost every morning.- Pain is more severe now. Describes it as a cramping pain that is fairly persistent with some soreness. She may feel it again late in evening 2am approx would feel it overnight and then it bothers her when she wakes up. - Admits some irregular bowel habits, may have BM every other day, not a new change, no bowel problems when she does go. - Denies any associated symptoms, nausea vomiting, diarrhea, constipation, dark stool blood in stool, loss of appetite.  Pregnancy Followed by [facility], currently status of pregnancy is 15 weeks, boy.    Depression screen [facility] 2/9 07/11/2021 05/18/2021 04/23/2021  Decreased Interest 0 0 0  Down, Depressed, Hopeless 0 0 0  PHQ - 2 Score 0 0 0  Altered sleeping 3 - 1  Tired, decreased energy 1 - 1  Change in appetite 0 - 0  Feeling bad or failure about yourself  0 - 0  Trouble concentrating 0 - 0  Moving slowly or fidgety/restless 0 - 0  Suicidal thoughts 0 - 0  PHQ-9 Score 4 - 2  Difficult doing work/chores Somewhat difficult - Not difficult at all    Social History   Tobacco Use   Smoking status: Never    Passive exposure: Past   Smokeless tobacco:  Never  Vaping Use   Vaping Use: Never used  Substance Use Topics   Alcohol use: Not Currently    Comment: occasional    Drug use: No    Review of Systems Per HPI unless specifically indicated above     Objective:    BP 104/61   Pulse 72   Ht 5\' 3"  (1.6 m)   Wt 163 lb 6.4 oz (74.1 kg)   LMP 03/06/2021   SpO2 99%   BMI 28.95 kg/m   Wt Readings from Last 3 Encounters:  07/11/21 163 lb 6.4 oz (74.1 kg)  07/04/21 161 lb 12.8 oz (73.4 kg)  06/28/21 161 lb 12.8 oz (73.4 kg)    Physical Exam Vitals and nursing note reviewed.  Constitutional:      General: She is not in acute distress.    Appearance: Normal appearance. She is well-developed. She is not diaphoretic.     Comments: Well-appearing, comfortable, cooperative  HENT:     Head: Normocephalic and atraumatic.  Eyes:     General:        Right eye: No discharge.        Left eye: No discharge.     Conjunctiva/sclera: Conjunctivae normal.  Neck:     Thyroid: No thyromegaly.  Cardiovascular:     Rate and Rhythm: Normal rate and regular rhythm.     Heart sounds: Normal heart  sounds. No murmur heard. Pulmonary:     Effort: Pulmonary effort is normal. No respiratory distress.     Breath sounds: Normal breath sounds. No wheezing or rales.  Abdominal:     General: Bowel sounds are normal.     Palpations: Abdomen is soft.     Tenderness: There is no abdominal tenderness.     Comments: Abdomen consistent with current pregnancy  Musculoskeletal:        General: Normal range of motion.     Cervical back: Normal range of motion and neck supple.  Lymphadenopathy:     Cervical: No cervical adenopathy.  Skin:    General: Skin is warm and dry.     Findings: No erythema or rash.  Neurological:     Mental Status: She is alert and oriented to person, place, and time.  Psychiatric:        Mood and Affect: Mood normal.        Behavior: Behavior normal.        Thought Content: Thought content normal.     Comments: Well  groomed, good eye contact, normal speech and thoughts     Results for orders placed or performed in visit on 07/04/21  POC Urinalysis Dipstick OB  Result Value Ref Range   Color, UA yellow    Clarity, UA clear    Glucose, UA Negative Negative   Bilirubin, UA ne    Ketones, UA neg    Spec Grav, UA 1.010 1.010 - 1.025   Blood, UA neg    pH, UA 7.0 5.0 - 8.0   POC,PROTEIN,UA Negative Negative, Trace, Small (1+), Moderate (2+), Large (3+), 4+   Urobilinogen, UA 0.2 0.2 or 1.0 E.U./dL   Nitrite, UA neg    Leukocytes, UA Negative Negative   Appearance yellow;clear    Odor        Assessment & Plan:   Problem List Items Addressed This Visit   None Visit Diagnoses     Slow transit constipation    -  Primary   Relevant Medications   polyethylene glycol powder (GLYCOLAX/MIRALAX) 17 GM/SCOOP powder       Clinically chronic functional abdominal pain for 5 years, now worse in past month History of known constipation on prior eval 1 year ago with large stool burden on x-ray  Note currently [redacted] weeks pregnant, symptoms were present for years PRIOR to pregnancy and persisting during pregnancy. Has been closely followed by GYN. Here for eval of chronic symptom.  No other GI red flag symptoms at this time with reassuring presentation.  No acute abdominal pain. Benign abdomen. Noted pregnancy.  Discussion on constipation and functional bowel symptoms. May have impacted bladder / bowel dysfunction worse in pregnancy based on anatomy.  Start Miralax as advised for regular dosing, can do inc dose initial then maintenance dose or intermittent. Goal for longer term use for now. Anticipate future referral to GI for consultation and Imaging as warranted - would defer until after pregnancy.  Return criteria given.  Meds ordered this encounter  Medications   polyethylene glycol powder (GLYCOLAX/MIRALAX) 17 GM/SCOOP powder    Sig: Take 17 g by mouth daily as needed for mild constipation or  moderate constipation.    Dispense:  255 g    Refill:  2      Follow up plan: Return if symptoms worsen or fail to improve.   Saralyn Pilar, DO [facility] 07/11/2021, 8:51 AM

## 2021-07-12 ENCOUNTER — Ambulatory Visit: Payer: Managed Care, Other (non HMO) | Admitting: Family Medicine

## 2021-07-25 ENCOUNTER — Encounter: Payer: Self-pay | Admitting: Obstetrics and Gynecology

## 2021-07-25 ENCOUNTER — Ambulatory Visit (INDEPENDENT_AMBULATORY_CARE_PROVIDER_SITE_OTHER): Payer: Managed Care, Other (non HMO) | Admitting: Obstetrics and Gynecology

## 2021-07-25 ENCOUNTER — Other Ambulatory Visit: Payer: Self-pay

## 2021-07-25 VITALS — BP 112/63 | HR 61 | Wt 166.0 lb

## 2021-07-25 DIAGNOSIS — Z1379 Encounter for other screening for genetic and chromosomal anomalies: Secondary | ICD-10-CM

## 2021-07-25 DIAGNOSIS — Z98891 History of uterine scar from previous surgery: Secondary | ICD-10-CM

## 2021-07-25 DIAGNOSIS — Z3482 Encounter for supervision of other normal pregnancy, second trimester: Secondary | ICD-10-CM

## 2021-07-25 DIAGNOSIS — Z3689 Encounter for other specified antenatal screening: Secondary | ICD-10-CM

## 2021-07-25 DIAGNOSIS — Z3A17 17 weeks gestation of pregnancy: Secondary | ICD-10-CM

## 2021-07-25 DIAGNOSIS — O99612 Diseases of the digestive system complicating pregnancy, second trimester: Secondary | ICD-10-CM

## 2021-07-25 DIAGNOSIS — Z23 Encounter for immunization: Secondary | ICD-10-CM

## 2021-07-25 DIAGNOSIS — K5900 Constipation, unspecified: Secondary | ICD-10-CM | POA: Insufficient documentation

## 2021-07-25 LAB — POCT URINALYSIS DIPSTICK OB
Bilirubin, UA: NEGATIVE
Blood, UA: NEGATIVE
Glucose, UA: NEGATIVE
Ketones, UA: NEGATIVE
Leukocytes, UA: NEGATIVE
Nitrite, UA: NEGATIVE
Spec Grav, UA: 1.02 (ref 1.010–1.025)
Urobilinogen, UA: 2 E.U./dL — AB
pH, UA: 7 (ref 5.0–8.0)

## 2021-07-25 NOTE — Progress Notes (Signed)
ROB: Doing well overall. Does note some abdominal pain when waking in the mornings.  Thinks it is constipation. Notes that this was happening also prior to pregnancy. Did have a workup, was prescribe Miralax. Advised on increasing fiber, water, and can use Miralax as needed during pregnancy.  Discussed dietary changes as patient notes she is not eating the healthiest right now. Normal MaterniT21, for AFP today. RTC in 4 weeks, for anatomy scan then.

## 2021-07-25 NOTE — Patient Instructions (Signed)
Influenza (Flu) Vaccine (Inactivated or Recombinant): What You Need to Know 1. Why get vaccinated? Influenza vaccine can prevent influenza (flu). Flu is a contagious disease that spreads around the [place] every year, usually between October and May. Anyone can get the flu, but it is more dangerous for some people. Infants and young children, people 42 years and older, pregnant people, and people with certain health conditions or a weakened immune system are at greatest risk of flu complications. Pneumonia, bronchitis, sinus infections, and ear infections are examples of flu-related complications. If you have a medical condition, such as heart disease, cancer, or diabetes, flu can make it worse. Flu can cause fever and chills, sore throat, muscle aches, fatigue, cough, headache, and runny or stuffy nose. Some people may have vomiting and diarrhea, though this is more common in children than adults. In an average year, thousands of people in the [place] die from flu, and many more are hospitalized. Flu vaccine prevents millions of illnesses and flu-related visits to the doctor each year. 2. Influenza vaccines CDC recommends everyone 6 months and older get vaccinated every flu season. Children 6 months through 44 years of age may need 2 doses during a single flu season. Everyone else needs only 1 dose each flu season. It takes about 2 weeks for protection to develop after vaccination. There are many flu viruses, and they are always changing. Each year a new flu vaccine is made to protect against the influenza viruses believed to be likely to cause disease in the upcoming flu season. Even when the vaccine doesn't exactly match these viruses, it may still provide some protection. Influenza vaccine does not cause flu. Influenza vaccine may be given at the same time as other vaccines. 3. Talk with your health care provider Tell your vaccination provider if the person getting the vaccine: Has had  an allergic reaction after a previous dose of influenza vaccine, or has any severe, life-threatening allergies Has ever had Guillain-Barr Syndrome (also called "GBS") In some cases, your health care provider may decide to postpone influenza vaccination until a future visit. Influenza vaccine can be administered at any time during pregnancy. People who are or will be pregnant during influenza season should receive inactivated influenza vaccine. People with minor illnesses, such as a cold, may be vaccinated. People who are moderately or severely ill should usually wait until they recover before getting influenza vaccine. Your health care provider can give you more information. 4. Risks of a vaccine reaction Soreness, redness, and swelling where the shot is given, fever, muscle aches, and headache can happen after influenza vaccination. There may be a very small increased risk of Guillain-Barr Syndrome (GBS) after inactivated influenza vaccine (the flu shot). Young children who get the flu shot along with pneumococcal vaccine (PCV13) and/or DTaP vaccine at the same time might be slightly more likely to have a seizure caused by fever. Tell your health care provider if a child who is getting flu vaccine has ever had a seizure. People sometimes faint after medical procedures, including vaccination. Tell your provider if you feel dizzy or have vision changes or ringing in the ears. As with any medicine, there is a very remote chance of a vaccine causing a severe allergic reaction, other serious injury, or death. 5. What if there is a serious problem? An allergic reaction could occur after the vaccinated person leaves the clinic. If you see signs of a severe allergic reaction (hives, swelling of the face and throat, difficulty breathing,  a fast heartbeat, dizziness, or weakness), call 9-1-1 and get the person to the nearest hospital. For other signs that concern you, call your health care provider. Adverse  reactions should be reported to the Vaccine Adverse Event Reporting System (VAERS). Your health care provider will usually file this report, or you can do it yourself. Visit the VAERS website at www.vaers.hhs.gov or call 1-800-822-7967. VAERS is only for reporting reactions, and VAERS staff members do not give medical advice. 6. The National Vaccine Injury Compensation Program The National Vaccine Injury Compensation Program (VICP) is a federal program that was created to compensate people who may have been injured by certain vaccines. Claims regarding alleged injury or death due to vaccination have a time limit for filing, which may be as short as two years. Visit the VICP website at www.hrsa.gov/vaccinecompensation or call 1-800-338-2382 to learn about the program and about filing a claim. 7. How can I learn more? Ask your health care provider. Call your local or state health department. Visit the website of the Food and Drug Administration (FDA) for vaccine package inserts and additional information at www.fda.gov/vaccines-blood-biologics/vaccines. Contact the Centers for Disease Control and Prevention (CDC): Call 1-800-232-4636 (1-800-CDC-INFO) or Visit CDC's website at www.cdc.gov/flu. Vaccine Information Statement Inactivated Influenza Vaccine (05/12/2020) This information is not intended to replace advice given to you by your health care provider. Make sure you discuss any questions you have with your health care provider. Document Revised: 06/29/2020 Document Reviewed: 06/29/2020 Elsevier Patient Education  2022 Elsevier Inc. Common Medications Safe in Pregnancy  Acne:      Constipation:  Benzoyl Peroxide     Colace  Clindamycin      Dulcolax Suppository  Topica Erythromycin     Fibercon  Salicylic Acid      Metamucil         Miralax AVOID:        Senakot   Accutane    Cough:  Retin-A       Cough Drops  Tetracycline      Phenergan w/ Codeine if Rx  Minocycline      Robitussin (Plain  & DM)  Antibiotics:     Crabs/Lice:  Ceclor       RID  Cephalosporins    AVOID:  E-Mycins      Kwell  Keflex  Macrobid/Macrodantin   Diarrhea:  Penicillin      Kao-Pectate  Zithromax      Imodium AD         PUSH FLUIDS AVOID:       Cipro     Fever:  Tetracycline      Tylenol (Regular or Extra  Minocycline       Strength)  Levaquin      Extra Strength-Do not          Exceed 8 tabs/24 hrs Caffeine:        <200mg/day (equiv. To 1 cup of coffee or  approx. 3 12 oz sodas)         Gas: Cold/Hayfever:       Gas-X  Benadryl      Mylicon  Claritin       Phazyme  **Claritin-D        Chlor-Trimeton    Headaches:  Dimetapp      ASA-Free Excedrin  Drixoral-Non-Drowsy     Cold Compress  Mucinex (Guaifenasin)     Tylenol (Regular or Extra  Sudafed/Sudafed-12 Hour     Strength)  **Sudafed PE Pseudoephedrine   Tylenol Cold & Sinus       Vicks Vapor Rub  Zyrtec  **AVOID if Problems With Blood Pressure         Heartburn: Avoid lying down for at least 1 hour after meals  Aciphex      Maalox     Rash:  Milk of Magnesia     Benadryl    Mylanta       1% Hydrocortisone Cream  Pepcid  Pepcid Complete   Sleep Aids:  Prevacid      Ambien   Prilosec       Benadryl  Rolaids       Chamomile Tea  Tums (Limit 4/day)     Unisom         Tylenol PM         Warm milk-add vanilla or  Hemorrhoids:       Sugar for taste  Anusol/Anusol H.C.  (RX: Analapram 2.5%)  Sugar Substitutes:  Hydrocortisone OTC     Ok in moderation  Preparation H      Tucks        Vaseline lotion applied to tissue with wiping    Herpes:     Throat:  Acyclovir      Oragel  Famvir  Valtrex     Vaccines:         Flu Shot Leg Cramps:       *Gardasil  Benadryl      Hepatitis A         Hepatitis B Nasal Spray:       Pneumovax  Saline Nasal Spray     Polio Booster         Tetanus Nausea:       Tuberculosis test or PPD  Vitamin B6 25 mg TID   AVOID:    Dramamine      *Gardasil  Emetrol       Live  Poliovirus  Ginger Root 250 mg QID    MMR (measles, mumps &  High Complex Carbs @ Bedtime    rebella)  Sea Bands-Accupressure    Varicella (Chickenpox)  Unisom 1/2 tab TID     *No known complications           If received before Pain:         Known pregnancy;   Darvocet       Resume series after  Lortab        Delivery  Percocet    Yeast:   Tramadol      Femstat  Tylenol 3      Gyne-lotrimin  Ultram       Monistat  Vicodin           MISC:         All Sunscreens           Hair Coloring/highlights          Insect Repellant's          (Including DEET)         Mystic Tans  

## 2021-07-25 NOTE — Progress Notes (Signed)
OB-Pt present for routine prenatal care.  Pt c/o abd pain noticed it more in the morning when she first wakes up.  Pt think that it is constipation and noticed that it occurs after eating some foods. Flu vaccine administered.

## 2021-07-26 ENCOUNTER — Encounter: Payer: Self-pay | Admitting: Family Medicine

## 2021-07-26 ENCOUNTER — Telehealth (INDEPENDENT_AMBULATORY_CARE_PROVIDER_SITE_OTHER): Payer: Managed Care, Other (non HMO) | Admitting: Family Medicine

## 2021-07-26 DIAGNOSIS — K599 Functional intestinal disorder, unspecified: Secondary | ICD-10-CM | POA: Diagnosis not present

## 2021-07-26 DIAGNOSIS — K5901 Slow transit constipation: Secondary | ICD-10-CM

## 2021-07-26 NOTE — Patient Instructions (Addendum)
Referral to [facility]) [address] - [address] [place], [place] [address] Phone: [phone]   Stay tuned for apt  Okay to increase dose Miralax as discussed You have 2 refills on file, need to wait 30 days from last fill (07/11/21) to get next bottle  Keep inc water / fiber  Follow up as needed.  Please schedule a Follow-up Appointment to: Return if symptoms worsen or fail to improve.  If you have any other questions or concerns, please feel free to call the office or send a message through MyChart. You may also schedule an earlier appointment if necessary.  Additionally, you may be receiving a survey about your experience at our office within a few days to 1 week by e-mail or mail. We value your feedback.  Saralyn Pilar, DO [facility], [place]

## 2021-07-26 NOTE — Progress Notes (Signed)
Subjective:    Patient ID: Tara Baldwin, female    DOB: 08-Jul-1991, 30 y.o.   MRN: [id number]  Tara Baldwin is a 30 y.o. female presenting on 07/26/2021 for Abdominal Pain  Virtual / Telehealth Encounter - Video Visit via MyChart The purpose of this virtual visit is to provide medical care while limiting exposure to the novel coronavirus (COVID19) for both patient and office staff.  Consent was obtained for remote visit:  Yes.   Answered questions that patient had about telehealth interaction:  Yes.   I discussed the limitations, risks, security and privacy concerns of performing an evaluation and management service by video/telephone. I also discussed with the patient that there may be a patient responsible charge related to this service. The patient expressed understanding and agreed to proceed.  Patient Location: Home Provider Location: [facility] (Office)  Participants in virtual visit: - Patient: Tara Baldwin - CMA: Burnell Blanks, CMA - Provider: Dr Althea Charon   HPI  Constipation / Functional Abdominal Pain Last visit with me on 07/11/21, she has had chronic history of abdominal pain >5 years as discussed last time, she tried the miralax as recommended 1 capful daily with good results loose stool, and has improved, not waking up in pain. She is taking Miralax 5 days taking it and 2 days off. Has helped her so far.  Pain has returned now 4 mornings in a row. She says diet has changed and eating anything.  She is currently pregnant >15 weeks. She has seen her OBGYN Dr Valentino Saxon [facility] yesterday and reviewed this as well. Not determined any pregnancy.  Not taking in as much water, but goal now to improve intake and fiber.  She has not see GI specialist.   Depression screen [facility] 2/9 07/11/2021 05/18/2021 04/23/2021  Decreased Interest 0 0 0  Down, Depressed, Hopeless 0 0 0  PHQ - 2 Score 0 0 0  Altered sleeping 3 - 1  Tired, decreased energy 1 - 1  Change in  appetite 0 - 0  Feeling bad or failure about yourself  0 - 0  Trouble concentrating 0 - 0  Moving slowly or fidgety/restless 0 - 0  Suicidal thoughts 0 - 0  PHQ-9 Score 4 - 2  Difficult doing work/chores Somewhat difficult - Not difficult at all    Social History   Tobacco Use   Smoking status: Never    Passive exposure: Past   Smokeless tobacco: Never  Vaping Use   Vaping Use: Never used  Substance Use Topics   Alcohol use: Not Currently    Comment: occasional    Drug use: No    Review of Systems Per HPI unless specifically indicated above     Objective:    LMP 03/06/2021   Wt Readings from Last 3 Encounters:  07/25/21 166 lb (75.3 kg)  07/11/21 163 lb 6.4 oz (74.1 kg)  07/04/21 161 lb 12.8 oz (73.4 kg)    Physical Exam  Note examination was completely remotely via video observation objective data only  Gen - well-appearing, no acute distress or apparent pain, comfortable HEENT - eyes appear clear without discharge or redness Heart/Lungs - cannot examine virtually - observed no evidence of coughing or labored breathing. Abd - cannot examine virtually  Skin - face visible today- no rash Neuro - awake, alert, oriented Psych - not anxious appearing   Results for orders placed or performed in visit on 07/25/21  POC Urinalysis Dipstick OB  Result Value  Ref Range   Color, UA [place]    Clarity, UA clear    Glucose, UA Negative Negative   Bilirubin, UA neg    Ketones, UA neg    Spec Grav, UA 1.020 1.010 - 1.025   Blood, UA neg    pH, UA 7.0 5.0 - 8.0   POC,PROTEIN,UA Trace Negative, Trace, Small (1+), Moderate (2+), Large (3+), 4+   Urobilinogen, UA 2.0 (A) 0.2 or 1.0 E.U./dL   Nitrite, UA neg    Leukocytes, UA Negative Negative   Appearance     Odor        Assessment & Plan:   Problem List Items Addressed This Visit   None Visit Diagnoses     Slow transit constipation    -  Primary   Relevant Orders   Ambulatory referral to Gastroenterology    Functional bowel disorder       Relevant Orders   Ambulatory referral to Gastroenterology      Interval improved constipation / functional bowel pain and symptoms last visit 2 weeks ago after miralax trial She has had chronic bowel pain and constipation >5 years prior to current pregnancy  Currently pregnancy followed by Dr Valentino Saxon has seen her yesterday and doing well  Now inc water intake / fiber / miralax course   referral to GI specialist for consultation on >5 year chronic constipation / functional bowel/abdominal pain  Orders Placed This Encounter  Procedures   Ambulatory referral to Gastroenterology    Referral Priority:   Routine    Referral Type:   Consultation    Referral Reason:   Specialty Services Required    Number of Visits Requested:   1     No orders of the defined types were placed in this encounter.    Follow up plan: Return if symptoms worsen or fail to improve.  Patient verbalizes understanding with the above medical recommendations including the limitation of remote medical advice.  Specific follow-up and call-back criteria were given for patient to follow-up or seek medical care more urgently if needed.  Total duration of direct patient care provided via video conference: 12  minutes   Saralyn Pilar, DO [facility] 07/26/2021, 11:30 AM

## 2021-07-28 LAB — AFP, SERUM, OPEN SPINA BIFIDA
AFP MoM: 1.05
AFP Value: 46.7 ng/mL
Gest. Age on Collection Date: 17.6 weeks
Maternal Age At EDD: 29.3 yr
OSBR Risk 1 IN: 10000
Test Results:: NEGATIVE
Weight: 166 [lb_av]

## 2021-08-21 NOTE — Patient Instructions (Signed)

## 2021-08-22 ENCOUNTER — Other Ambulatory Visit: Payer: Self-pay

## 2021-08-22 ENCOUNTER — Ambulatory Visit (INDEPENDENT_AMBULATORY_CARE_PROVIDER_SITE_OTHER): Payer: Managed Care, Other (non HMO) | Admitting: Obstetrics and Gynecology

## 2021-08-22 ENCOUNTER — Encounter: Payer: Self-pay | Admitting: Obstetrics and Gynecology

## 2021-08-22 VITALS — BP 104/59 | HR 75 | Wt 168.1 lb

## 2021-08-22 DIAGNOSIS — Z3482 Encounter for supervision of other normal pregnancy, second trimester: Secondary | ICD-10-CM

## 2021-08-22 DIAGNOSIS — Z3A21 21 weeks gestation of pregnancy: Secondary | ICD-10-CM

## 2021-08-22 LAB — POCT URINALYSIS DIPSTICK OB
Bilirubin, UA: NEGATIVE
Blood, UA: NEGATIVE
Glucose, UA: NEGATIVE
Ketones, UA: NEGATIVE
Leukocytes, UA: NEGATIVE
Nitrite, UA: NEGATIVE
POC,PROTEIN,UA: NEGATIVE
Spec Grav, UA: <=1.005 — AB (ref 1.010–1.025)
Urobilinogen, UA: 0.2 E.U./dL
pH, UA: 8 (ref 5.0–8.0)

## 2021-08-22 NOTE — Progress Notes (Addendum)
ROB; Patient states previous abdominal pain has resolved with help from PCP and diet changes. Patient reports movement and no other concerns. Patient declined flu shot.

## 2021-08-22 NOTE — Progress Notes (Signed)
ROB: Abdominal pain much improved.  Patient changed her diet and moved away from fried and fatty foods.  Feels much better.  Taking vitamins as directed.  Noting daily fetal movement.  Anatomy ultrasound on Friday.

## 2021-08-24 ENCOUNTER — Ambulatory Visit (INDEPENDENT_AMBULATORY_CARE_PROVIDER_SITE_OTHER): Payer: Managed Care, Other (non HMO)

## 2021-08-24 ENCOUNTER — Other Ambulatory Visit: Payer: Self-pay

## 2021-08-24 DIAGNOSIS — Z3482 Encounter for supervision of other normal pregnancy, second trimester: Secondary | ICD-10-CM | POA: Diagnosis not present

## 2021-08-24 DIAGNOSIS — Z3689 Encounter for other specified antenatal screening: Secondary | ICD-10-CM | POA: Diagnosis not present

## 2021-08-29 ENCOUNTER — Ambulatory Visit (INDEPENDENT_AMBULATORY_CARE_PROVIDER_SITE_OTHER): Payer: Managed Care, Other (non HMO) | Admitting: Gastroenterology

## 2021-08-29 ENCOUNTER — Other Ambulatory Visit: Payer: Self-pay

## 2021-08-29 ENCOUNTER — Encounter: Payer: Self-pay | Admitting: Gastroenterology

## 2021-08-29 VITALS — BP 107/66 | HR 84 | Temp 97.3°F | Ht 63.0 in | Wt 171.6 lb

## 2021-08-29 DIAGNOSIS — K5900 Constipation, unspecified: Secondary | ICD-10-CM | POA: Diagnosis not present

## 2021-08-29 NOTE — Progress Notes (Signed)
Gastroenterology Consultation  Referring Provider:     Saralyn Pilar * Primary Care Physician:  Smitty Cords, DO Primary Gastroenterologist:  Dr. Servando Snare     Reason for Consultation:     Constipation        HPI:   Tara Baldwin is a 30 y.o. y/o female referred for consultation & management of Constipation by Dr. Althea Charon, Netta Neat, DO.  This patient comes in today with a long-standing history of constipation.  The patient had some significant abdominal pain in the past and went to the ER and had a imaging exam that showed her to have a lot of stool.  The patient states that she has been on MiraLAX with some softening of her stools but reports that she has left-sided abdominal pain in the morning when she urinates.  The patient denies any fevers chills nausea vomiting black stools or bloody stools.  She also denies any family history of colon cancer colon polyps.  The constipation has been present since the birth of her last child.  She is now [redacted] weeks pregnant and taking MiraLAX once a day.  Past Medical History:  Diagnosis Date   Diastasis recti 08/20/2018   Headache    STD (female) 05/16/2017   chlmydia    Past Surgical History:  Procedure Laterality Date   CESAREAN SECTION N/A 12/19/2017   Procedure: CESAREAN SECTION;  Surgeon: Herold Harms, MD;  Location: [facility] ORS;  Service: Obstetrics;  Laterality: N/A;    Prior to Admission medications   Medication Sig Start Date End Date Taking? Authorizing Provider  polyethylene glycol powder (GLYCOLAX/MIRALAX) 17 GM/SCOOP powder Take 17 g by mouth daily as needed for mild constipation or moderate constipation. 07/11/21  Yes Karamalegos, Netta Neat, DO  Prenatal Vit-Fe Fumarate-FA (PRENATAL VITAMINS) 28-0.8 MG TABS Take by mouth.   Yes [provider]    Family History  Problem Relation Age of Onset   Hypertension Father    Breast cancer Paternal Grandmother    Ovarian cancer Neg Hx    Colon  cancer Neg Hx      Social History   Tobacco Use   Smoking status: Never    Passive exposure: Past   Smokeless tobacco: Never  Vaping Use   Vaping Use: Never used  Substance Use Topics   Alcohol use: Not Currently    Comment: occasional    Drug use: No    Allergies as of 08/29/2021   (No Known Allergies)    Review of Systems:    All systems reviewed and negative except where noted in HPI.   Physical Exam:  BP 107/66 (BP Location: Left Arm, Patient Position: Sitting, Cuff Size: Large)   Pulse 84   Temp (!) 97.3 F (36.3 C) (Temporal)   Ht 5\' 3"  (1.6 m)   Wt 171 lb 9.6 oz (77.8 kg)   LMP 03/06/2021   BMI 30.40 kg/m  Patient's last menstrual period was 03/06/2021. General:   Alert,  Well-developed, well-nourished, pleasant and cooperative in NAD Head:  Normocephalic and atraumatic. Eyes:  Sclera clear, no icterus.   Conjunctiva pink. Ears:  Normal auditory acuity. Neck:  Supple; no masses or thyromegaly. Lungs:  Respirations even and unlabored.  Clear throughout to auscultation.   No wheezes, crackles, or rhonchi. No acute distress. Heart:  Regular rate and rhythm; no murmurs, clicks, rubs, or gallops. Abdomen:  Normal bowel sounds.  No bruits.  Soft, non-tender and non-distended without masses, hepatosplenomegaly or hernias noted.  No  guarding or rebound tenderness.  Negative Carnett sign.   Rectal:  Deferred.  Pulses:  Normal pulses noted. Extremities:  No clubbing or edema.  No cyanosis. Neurologic:  Alert and oriented x3;  grossly normal neurologically. Skin:  Intact without significant lesions or rashes.  No jaundice. Lymph Nodes:  No significant cervical adenopathy. Psych:  Alert and cooperative. Normal mood and affect.  Imaging Studies: No results found.  Assessment and Plan:   Tara Baldwin is a 30 y.o. y/o female who comes in today with a history of constipation and she is now [redacted] weeks pregnant.  The patient continues to have some left-sided abdominal pain  typically in the morning when she urinates.  She feels like she is moving her bowels but is not sure if she is completely evacuating when she defecates. The patient will increase the MiraLAX to twice a day and she will start herself on Citrucel.  She will continue this until after the pregnancy whereupon we can try antispasmodics or even Linzess for her constipation and abdominal discomfort.  The patient does not have any worrisome symptoms associated with her constipation. The patient has been explained the plan and agrees with it.    Midge Minium, MD. Clementeen Graham    Note: This dictation was prepared with Dragon dictation along with smaller phrase technology. Any transcriptional errors that result from this process are unintentional.

## 2021-09-05 ENCOUNTER — Ambulatory Visit: Payer: Managed Care, Other (non HMO) | Admitting: Gastroenterology

## 2021-09-19 ENCOUNTER — Ambulatory Visit (INDEPENDENT_AMBULATORY_CARE_PROVIDER_SITE_OTHER): Payer: Managed Care, Other (non HMO) | Admitting: Obstetrics and Gynecology

## 2021-09-19 ENCOUNTER — Other Ambulatory Visit: Payer: Self-pay

## 2021-09-19 VITALS — BP 102/64 | HR 66 | Wt 174.8 lb

## 2021-09-19 DIAGNOSIS — Z3482 Encounter for supervision of other normal pregnancy, second trimester: Secondary | ICD-10-CM

## 2021-09-19 DIAGNOSIS — Z3A25 25 weeks gestation of pregnancy: Secondary | ICD-10-CM

## 2021-09-19 DIAGNOSIS — Z98891 History of uterine scar from previous surgery: Secondary | ICD-10-CM

## 2021-09-19 LAB — POCT URINALYSIS DIPSTICK OB
Bilirubin, UA: NEGATIVE
Blood, UA: NEGATIVE
Glucose, UA: NEGATIVE
Ketones, UA: NEGATIVE
Leukocytes, UA: NEGATIVE
Nitrite, UA: NEGATIVE
POC,PROTEIN,UA: NEGATIVE
Spec Grav, UA: 1.01 (ref 1.010–1.025)
Urobilinogen, UA: 0.2 E.U./dL
pH, UA: 7.5 (ref 5.0–8.0)

## 2021-09-19 NOTE — Progress Notes (Signed)
ROB: Doing well overall.  No issues.  Discussed breastfeeding (see topics). Discussed doula program.  RTC in 4 weeks, for 28 week labs at that time.

## 2021-09-19 NOTE — Patient Instructions (Signed)

## 2021-09-19 NOTE — Progress Notes (Deleted)
ROB

## 2021-10-07 NOTE — L&D Delivery Note (Deleted)
Obstetric History and Physical ? ?Tara Baldwin is a 31 y.o. G2P1001 with IUP at [redacted]w[redacted]d presenting last night for vaginal bleeding after office foley bulb insertion yesterday in clinic.  Was scheduled for IOL today.  Upon assessment, foley bulb noted to expel, with rupture of membranes while in triage. Patient states she has been having  regular, every 2-3 minutes contractions, minimal vaginal bleeding, ruptured moderate meconium membranes, with active fetal movement.  Patient's OB history significant for history of C/S x 1 (for failure to progress at 7 cm), desiring TOLAC ? ?Prenatal Course ?Source of Care: [facility] with onset of care at 10 weeks ?Pregnancy complications or risks: ?Patient Active Problem List  ? Diagnosis Date Noted  ? Post-dates pregnancy 01/01/2022  ? Constipation during pregnancy in second trimester 07/25/2021  ? Diastasis recti 08/20/2018  ? History of C-section 12/30/2017  ? Left flank pain 12/14/2017  ? Breast lump on right side at 11 o'clock position 10/09/2017  ? ?She plans to breastfeed ?She desires oral contraceptives (estrogen/progesterone) for postpartum contraception.  ? ?Prenatal labs and studies: ?ABO, Rh: --/--/B POS (03/28 2140) ?Antibody: POS (03/28 2140) ?Rubella: 3.88 (08/12 1147) ?RPR: Non Reactive (01/12 0939)  ?HBsAg: Negative (08/12 1147)  ?HIV:    ?GUR:KYHCWCBJ/-- (02/23 1641) ?1 hr Glucola  normal ?Genetic screening normal ?Anatomy US normal ? ? ?Past Medical History:  ?Diagnosis Date  ? Diastasis recti 08/20/2018  ? Headache   ? STD (female) 05/16/2017  ? chlmydia  ? ? ?Past Surgical History:  ?Procedure Laterality Date  ? CESAREAN SECTION N/A 12/19/2017  ? Procedure: CESAREAN SECTION;  Surgeon: Herold Harms, MD;  Location: [facility] ORS;  Service: Obstetrics;  Laterality: N/A;  ? ? ?OB History  ?Gravida Para Term Preterm AB Living  ?2 1 1     1   ?SAB IAB Ectopic Multiple Live Births  ?      0 1  ?  ?# Outcome Date GA Lbr Len/2nd Weight Sex Delivery Anes  PTL Lv  ?2 Current           ?1 Term 12/19/17 [redacted]w[redacted]d  3200 g M CS-LTranv EPI  LIV  ? ? ?Social History  ? ?Socioeconomic History  ? Marital status: Married  ?  Spouse name: Not on file  ? Number of children: Not on file  ? Years of education: Not on file  ? Highest education level: Not on file  ?Occupational History  ? Not on file  ?Tobacco Use  ? Smoking status: Never  ?  Passive exposure: Past  ? Smokeless tobacco: Never  ?Vaping Use  ? Vaping Use: Never used  ?Substance and Sexual Activity  ? Alcohol use: Not Currently  ?  Comment: occasional   ? Drug use: No  ? Sexual activity: Yes  ?  Comment: Pregnant  ?Other Topics Concern  ? Not on file  ?Social History Narrative  ? Not on file  ? ?Social Determinants of Health  ? ?Financial Resource Strain: Not on file  ?Food Insecurity: Not on file  ?Transportation Needs: Not on file  ?Physical Activity: Not on file  ?Stress: Not on file  ?Social Connections: Not on file  ? ? ?Family History  ?Problem Relation Age of Onset  ? Hypertension Father   ? Breast cancer Paternal Grandmother   ? Ovarian cancer Neg Hx   ? Colon cancer Neg Hx   ? ? ?Medications Prior to Admission  ?Medication Sig Dispense Refill Last Dose  ? polyethylene glycol powder (  GLYCOLAX/MIRALAX) 17 GM/SCOOP powder Take 17 g by mouth daily as needed for mild constipation or moderate constipation. 255 g 2 Past Week  ? Prenatal Vit-Fe Fumarate-FA (PRENATAL VITAMINS) 28-0.8 MG TABS Take by mouth.   01/01/2022  ? ? ?No Known Allergies ? ?Review of Systems: Negative except for what is mentioned in HPI. ? ?Physical Exam: ?BP 112/68   Pulse 75   Temp 98.3 ?F (36.8 ?C) (Oral)   Resp 16   Ht 5\' 6"  (1.676 m)   Wt 87.5 kg   LMP 03/06/2021   SpO2 100%   BMI 31.15 kg/m?  ?CONSTITUTIONAL: Well-developed, well-nourished female in no acute distress.  ?HENT:  Normocephalic, atraumatic, External right and left ear normal. Oropharynx is clear and moist ?EYES: Conjunctivae and EOM are normal. Pupils are equal, round, and  reactive to light. No scleral icterus.  ?NECK: Normal range of motion, supple, no masses ?SKIN: Skin is warm and dry. No rash noted. Not diaphoretic. No erythema. No pallor. ?NEUROLOGIC: Alert and oriented to person, place, and time. Normal reflexes, muscle tone coordination. No cranial nerve deficit noted. ?PSYCHIATRIC: Normal mood and affect. Normal behavior. Normal judgment and thought content. ?CARDIOVASCULAR: Normal heart rate noted, regular rhythm ?RESPIRATORY: Effort and breath sounds normal, no problems with respiration noted ?ABDOMEN: Soft, nontender, nondistended, gravid. ?MUSCULOSKELETAL: Normal range of motion. No edema and no tenderness. 2+ distal pulses. ? ?Cervical Exam: Dilatation 4.5 cm   Effacement 60%   Station 0 to +1   ?Presentation: cephalic ?FHT:  Baseline rate 140 bpm   Variability moderate  Accelerations present   Decelerations intermittent variable (shallow) with 2 prolonged decelerations lasting for 3 and 4 minutes at 0042 and 0101 respectively from baseline to 60s and 40's respectively.  ?Contractions: Every 1-2 mins, irregular ?  ?Pertinent Labs/Studies:   ?Results for orders placed or performed during the hospital encounter of 01/01/22 (from the past 24 hour(s))  ?CBC     Status: Abnormal  ? Collection Time: 01/01/22  9:03 PM  ?Result Value Ref Range  ? WBC 11.3 (H) 4.0 - 10.5 K/uL  ? RBC 4.31 3.87 - 5.11 MIL/uL  ? Hemoglobin 13.0 12.0 - 15.0 g/dL  ? HCT 38.1 36.0 - 46.0 %  ? MCV 88.4 80.0 - 100.0 fL  ? MCH 30.2 26.0 - 34.0 pg  ? MCHC 34.1 30.0 - 36.0 g/dL  ? RDW 12.6 11.5 - 15.5 %  ? Platelets 214 150 - 400 K/uL  ? nRBC 0.0 0.0 - 0.2 %  ?Type and screen     Status: None (Preliminary result)  ? Collection Time: 01/01/22  9:03 PM  ?Result Value Ref Range  ? ABO/RH(D) PENDING   ? Antibody Screen PENDING   ? Sample Expiration    ?  01/04/2022,2359 ?Performed at [facility], [address], [place], [place] ?  ?Type and screen     Status: None (Preliminary result)  ?  Collection Time: 01/01/22  9:40 PM  ?Result Value Ref Range  ? ABO/RH(D) B POS   ? Antibody Screen POS   ? Sample Expiration 01/04/2022,2359   ? Antibody Identification    ?  NON SPECIFIC ANTIBODY REACTIVITY ?Performed at [facility], [address] [place], [place] ?  ? Unit Number [id number]   ? Blood Component Type RBC LR PHER1   ? Unit division 00   ? Status of Unit ALLOCATED   ? Transfusion Status OK TO TRANSFUSE   ? Crossmatch Result COMPATIBLE   ?  Unit Number [id number]   ? Blood Component Type RED CELLS,LR   ? Unit division 00   ? Status of Unit ALLOCATED   ? Transfusion Status OK TO TRANSFUSE   ? Crossmatch Result COMPATIBLE   ? ? ?Assessment : ?Tara Baldwin is a 31 y.o. G2P1001 at 276w4d being admitted for linduction of labor due to postdates pregnancy. History of prior C-section x 1 desiring TOLAC. ? ?Plan: ?Labor: Augmentation with Pitocin as ordered as per protocol once fetal status more reassuring.  IUPC placed for amnioinfusion.  Is s/p epidural placement for analgesia.  ?FWB: Category II tracing, now improving after position changes, IVF bolus, and now amnioinfusion.  GBS negative. ?Delivery plan: Hopeful for vaginal delivery, however patient aware of possibility for C-section if persistent non-reassuring fetal status.  ? ? ?Hildred Laserherry, Midge Momon, MD ?[facility] ? ? ? ? ?

## 2021-10-07 NOTE — L&D Delivery Note (Signed)
Delivery Summary for [name] ? ?Labor Events:   ?Preterm labor: No data found  ?Rupture date: 01/01/2022  ?Rupture time: 9:15 PM  ?Rupture type: Spontaneous  ?Fluid Color: Moderate Meconium ?Heavy Meconium  ?Induction: No data found  ?Augmentation: No data found  ?Complications: No data found  ?Cervical ripening: No data found No data found  ? No data found  ?   ?Delivery:   ?Episiotomy: No data found  ?Lacerations: No data found  ?Repair suture: No data found  ?Repair # of packets: No data found  ?Blood loss (ml): 400  ? ?Information for the patient's newborn:  Corrine, Tillis [[id number]]  ? ?Delivery ?01/02/2022 8:07 AM by  VBAC, Vacuum Assisted ?Sex:  female Gestational Age: [redacted]w[redacted]d ?Delivery Clinician:   ?Living?:  ? ?      APGARS  One minute Five minutes Ten minutes  ?Skin color:        ?Heart rate:        ?Grimace:        ?Muscle tone:        ?Breathing:        ?Totals: 2  6     ? ?Presentation/position:      ?Resuscitation:   ?Cord information:    Disposition of cord blood:     Blood gases sent?  ?Complications:   ?Placenta: Delivered:       appearance ?Newborn Measurements: ?Weight: 7 lb 0.5 oz (3190 g)  Height: 21.46"  Head circumference:    Chest circumference:    ?Other providers:    ?Additional  information: ?Forceps:   ?Vacuum:   ?Breech:   ?Observed anomalies   ? ? ? ? ?Operative Delivery Note ?At 8:07 AM a viable female was delivered via VBAC, Vacuum Assisted.  Presentation: vertex; Position: Left,, Occiput,, Anterior;  ? ?Patient had been pushing for ~ 1.5 hours with no further descent beyond +2 station. was examined and found to be fully dilated with fetal station of +2.  Patient's bladder was noted to be empty, and there were no known fetal contraindications to operative vaginal delivery. EFW was ~ 6.5-7 lbs by [place].  FHR tracing remarkable for Category I tracing during pushing. Moderately thick meconium noted at SROM on admission.  ? ?Risks of vacuum assistance were discussed in detail,  including but not limited to, bleeding, infection, damage to maternal tissues, fetal cephalohematoma, inability to effect vaginal delivery of the head or shoulder dystocia that cannot be resolved by established maneuvers and need for emergency cesarean section.  Patient gave verbal consent. ? ?The soft vacuum cup was positioned over the sagittal suture 3 cm anterior to posterior fontanelle.  Pressure was then increased to 500 mmHg, and the patient was instructed to push.  Pulling was administered along the pelvic curve while patient was pushing; there were 8 contractions and 2 popoffs.  Vacuum was reduced in between contractions. This brought the fetus to +3 station.  Patient continued to push for an additional 40 minutes with gradual descent. She began to experience some fatigue with pushing and efforts became reduced. The vacuum was again applied to expedite delivery as tracing now Category II with late decelerations.  Good variability was still noted during this time. After the second contraction, an additional pop-off occurred for a total of 3. The patient continued to push and ultimately the infant was then delivered atraumatically, noted to be a viable female infant, Apgars of 2 and 6.  Neonatology present for delivery.    The cord  was clamped and cut immediately, no delayed cord clamping observed, and the infant was handed to Neonatology.  A cord gas was collected. Pitocin infusion initiated for active management of third stage of labor.  There was spontaneous placental delivery, over an intact perineum, with a three-vessel cord and true knot present.  [id number], epidural anesthesia.   Sponge, instrument and needle counts were correct x 2.  The patient was stable after delivery with plans to transfer later to postpartum unit. Infant transferred to NICU due to respiratory distress and concerns for meconium aspiration.  ? ? ? ?APGAR: 2, 6; weight 7 lb 0.5 oz (3190 g).   ?Placenta status: spontaneously removed,  intact.   ?Cord:  3-vessel with the following complications: true knot.  Cord pH: obtained. ? ?Anesthesia:  Epidural ?Instruments: [place] vacuum ?Episiotomy: None ?Lacerations:  None ?Suture Repair:  None ?Est. Blood Loss (mL): 400 ? ?Mom to postpartum.  Baby to NICU. ? ?Hildred Laser, MD ?01/02/2022, 11:10 AM ? ? ? ?

## 2021-10-18 ENCOUNTER — Encounter: Payer: Self-pay | Admitting: Obstetrics and Gynecology

## 2021-10-18 ENCOUNTER — Other Ambulatory Visit: Payer: Managed Care, Other (non HMO)

## 2021-10-18 ENCOUNTER — Other Ambulatory Visit: Payer: Self-pay

## 2021-10-18 ENCOUNTER — Ambulatory Visit (INDEPENDENT_AMBULATORY_CARE_PROVIDER_SITE_OTHER): Payer: Managed Care, Other (non HMO) | Admitting: Obstetrics and Gynecology

## 2021-10-18 VITALS — BP 107/68 | Wt 177.8 lb

## 2021-10-18 DIAGNOSIS — Z3482 Encounter for supervision of other normal pregnancy, second trimester: Secondary | ICD-10-CM

## 2021-10-18 DIAGNOSIS — Z23 Encounter for immunization: Secondary | ICD-10-CM

## 2021-10-18 DIAGNOSIS — Z3A29 29 weeks gestation of pregnancy: Secondary | ICD-10-CM

## 2021-10-18 LAB — POCT URINALYSIS DIPSTICK OB
Bilirubin, UA: NEGATIVE
Blood, UA: NEGATIVE
Glucose, UA: NEGATIVE
Ketones, UA: NEGATIVE
Leukocytes, UA: NEGATIVE
Nitrite, UA: NEGATIVE
POC,PROTEIN,UA: NEGATIVE
Spec Grav, UA: 1.025 (ref 1.010–1.025)
Urobilinogen, UA: 0.2 E.U./dL
pH, UA: 5 (ref 5.0–8.0)

## 2021-10-18 NOTE — Progress Notes (Signed)
c 

## 2021-10-18 NOTE — Progress Notes (Signed)
ROB: Doing well.  Watching her diet.  Focused and involved in her prenatal care.  Had an episode of deep pelvic pressure and some back pain 1 week ago.  This resolved over approximately 24 hours.  1 hour GCT today.

## 2021-10-19 ENCOUNTER — Telehealth: Payer: Self-pay | Admitting: Obstetrics and Gynecology

## 2021-10-19 LAB — CBC
Hematocrit: 35.3 % (ref 34.0–46.6)
Hemoglobin: 11.9 g/dL (ref 11.1–15.9)
MCH: 30.2 pg (ref 26.6–33.0)
MCHC: 33.7 g/dL (ref 31.5–35.7)
MCV: 90 fL (ref 79–97)
Platelets: 178 10*3/uL (ref 150–450)
RBC: 3.94 x10E6/uL (ref 3.77–5.28)
RDW: 12.2 % (ref 11.7–15.4)
WBC: 8.3 10*3/uL (ref 3.4–10.8)

## 2021-10-19 LAB — GLUCOSE, 1 HOUR GESTATIONAL: Gestational Diabetes Screen: 77 mg/dL (ref 70–139)

## 2021-10-19 LAB — RPR: RPR Ser Ql: NONREACTIVE

## 2021-10-19 NOTE — Telephone Encounter (Signed)
Spoke with patient about FMLA paperwork - she did pay her form fee over the phone today, copy of receipt was mailed to patient. I made pt aware that the name on forms was Tara Baldwin and not [employer] she states she will check with her employer to see if that will be an issue since her insurance is still [employer]. Paperwork entered in Excel and given to clinical staff.

## 2021-11-08 ENCOUNTER — Encounter: Payer: Self-pay | Admitting: Obstetrics and Gynecology

## 2021-11-08 ENCOUNTER — Other Ambulatory Visit: Payer: Self-pay

## 2021-11-08 ENCOUNTER — Ambulatory Visit (INDEPENDENT_AMBULATORY_CARE_PROVIDER_SITE_OTHER): Payer: Managed Care, Other (non HMO) | Admitting: Obstetrics and Gynecology

## 2021-11-08 VITALS — BP 105/67 | HR 74 | Wt 179.0 lb

## 2021-11-08 DIAGNOSIS — K056 Periodontal disease, unspecified: Secondary | ICD-10-CM

## 2021-11-08 DIAGNOSIS — Z98891 History of uterine scar from previous surgery: Secondary | ICD-10-CM

## 2021-11-08 DIAGNOSIS — Z3482 Encounter for supervision of other normal pregnancy, second trimester: Secondary | ICD-10-CM

## 2021-11-08 DIAGNOSIS — Z3A32 32 weeks gestation of pregnancy: Secondary | ICD-10-CM

## 2021-11-08 LAB — POCT URINALYSIS DIPSTICK OB
Bilirubin, UA: NEGATIVE
Blood, UA: NEGATIVE
Glucose, UA: NEGATIVE
Ketones, UA: NEGATIVE
Leukocytes, UA: NEGATIVE
Nitrite, UA: NEGATIVE
POC,PROTEIN,UA: NEGATIVE
Spec Grav, UA: 1.015 (ref 1.010–1.025)
Urobilinogen, UA: NEGATIVE E.U./dL — AB
pH, UA: 7 (ref 5.0–8.0)

## 2021-11-08 NOTE — Progress Notes (Signed)
ROB: Noted significant issues with gum pain and swelling last week.  Denies tooth pain.  Tried flossing and use of Orajel. Notes that it is slowly resolving.  Gestational size borderline less than dates, visibly appears smaller than dates. Continue to monitor, if continues may consider growth [place]. RTC in 2 weeks.

## 2021-11-14 ENCOUNTER — Encounter: Payer: Self-pay | Admitting: Obstetrics and Gynecology

## 2021-11-29 ENCOUNTER — Encounter: Payer: Self-pay | Admitting: Obstetrics and Gynecology

## 2021-11-29 ENCOUNTER — Other Ambulatory Visit: Payer: Self-pay

## 2021-11-29 ENCOUNTER — Ambulatory Visit (INDEPENDENT_AMBULATORY_CARE_PROVIDER_SITE_OTHER): Payer: Managed Care, Other (non HMO) | Admitting: Obstetrics and Gynecology

## 2021-11-29 VITALS — BP 118/66 | HR 66 | Wt 183.4 lb

## 2021-11-29 DIAGNOSIS — Z113 Encounter for screening for infections with a predominantly sexual mode of transmission: Secondary | ICD-10-CM

## 2021-11-29 DIAGNOSIS — Z3A35 35 weeks gestation of pregnancy: Secondary | ICD-10-CM

## 2021-11-29 DIAGNOSIS — Z3483 Encounter for supervision of other normal pregnancy, third trimester: Secondary | ICD-10-CM

## 2021-11-29 LAB — POCT URINALYSIS DIPSTICK OB
Bilirubin, UA: NEGATIVE
Blood, UA: NEGATIVE
Glucose, UA: NEGATIVE
Ketones, UA: NEGATIVE
Leukocytes, UA: NEGATIVE
Nitrite, UA: NEGATIVE
POC,PROTEIN,UA: NEGATIVE
Spec Grav, UA: 1.01 (ref 1.010–1.025)
Urobilinogen, UA: 0.2 E.U./dL
pH, UA: 7 (ref 5.0–8.0)

## 2021-11-29 NOTE — Progress Notes (Signed)
ROB. Patient states feeling fetal movement with lots of pressure. Patient states she would like to discuss the details of labor and when it is time to go to the hospital.  G/C and GBS cultures ordered and to be preformed today. Patient states no questions or concerns at this time.

## 2021-11-29 NOTE — Progress Notes (Signed)
ROB: No problems- denies contractions.  Signs and symptoms of labor discussed.  Patient planning a TOLAC and would like to spend as much time as possible at home in labor.  GC/CT-GBS performed today.

## 2021-12-01 LAB — STREP GP B NAA: Strep Gp B NAA: NEGATIVE

## 2021-12-02 LAB — GC/CHLAMYDIA PROBE AMP
Chlamydia trachomatis, NAA: NEGATIVE
Neisseria Gonorrhoeae by PCR: NEGATIVE

## 2021-12-05 ENCOUNTER — Other Ambulatory Visit: Payer: Self-pay

## 2021-12-05 ENCOUNTER — Encounter: Payer: Self-pay | Admitting: Obstetrics and Gynecology

## 2021-12-05 ENCOUNTER — Ambulatory Visit (INDEPENDENT_AMBULATORY_CARE_PROVIDER_SITE_OTHER): Payer: Managed Care, Other (non HMO) | Admitting: Obstetrics and Gynecology

## 2021-12-05 VITALS — BP 120/61 | HR 79 | Wt 184.9 lb

## 2021-12-05 DIAGNOSIS — Z98891 History of uterine scar from previous surgery: Secondary | ICD-10-CM

## 2021-12-05 DIAGNOSIS — Z3483 Encounter for supervision of other normal pregnancy, third trimester: Secondary | ICD-10-CM

## 2021-12-05 DIAGNOSIS — Z3A36 36 weeks gestation of pregnancy: Secondary | ICD-10-CM

## 2021-12-05 NOTE — Patient Instructions (Signed)
Signs and Symptoms of Labor ?Labor is the body's natural process of moving the baby and the placenta out of the uterus. The process of labor usually starts when the baby is full-term, between 39 and 41 weeks of pregnancy. ?Signs and symptoms that you are close to going into labor ?As your body prepares for labor and the birth of your baby, you may notice the following symptoms in the weeks and days before true labor starts: ?Passing a small amount of thick, bloody mucus from your vagina. This is called normal bloody show or losing your mucus plug. This may happen more than a week before labor begins, or right before labor begins, as the opening of the cervix starts to widen (dilate). For some women, the entire mucus plug passes at once. For others, pieces of the mucus plug may gradually pass over several days. ?Your baby moving (dropping) lower in your pelvis to get into position for birth (lightening). When this happens, you may feel more pressure on your bladder and pelvic bone and less pressure on your ribs. This may make it easier to breathe. It may also cause you to need to urinate more often and have problems with bowel movements. ?Having "practice contractions," also called Braxton Hicks contractions or false labor. These occur at irregular (unevenly spaced) intervals that are more than 10 minutes apart. False labor contractions are common after exercise or sexual activity. They will stop if you change position, rest, or drink fluids. These contractions are usually mild and do not get stronger over time. They may feel like: ?A backache or back pain. ?Mild cramps, similar to menstrual cramps. ?Tightening or pressure in your abdomen. ?Other early symptoms include: ?Nausea or loss of appetite. ?Diarrhea. ?Having a sudden burst of energy, or feeling very tired. ?Mood changes. ?Having trouble sleeping. ?Signs and symptoms that labor has begun ?Signs that you are in labor may include: ?Having contractions that come  at regular (evenly spaced) intervals and increase in intensity. This may feel like more intense tightening or pressure in your abdomen that moves to your back. ?Contractions may also feel like rhythmic pain in your upper thighs or back that comes and goes at regular intervals. ?If you are delivering for the first time, this change in intensity of contractions often occurs at a more gradual pace. ?If you have given birth before, you may notice a more rapid progression of contraction changes. ?Feeling pressure in the vaginal area. ?Your water breaking (rupture of membranes). This is when the sac of fluid that surrounds your baby breaks. Fluid leaking from your vagina may be clear or blood-tinged. Labor usually starts within 24 hours of your water breaking, but it may take longer to begin. ?Some people may feel a sudden gush of fluid; others may notice repeatedly damp underwear. ?Follow these instructions at home: ? ?When labor starts, or if your water breaks, call your health care provider or nurse care line. Based on your situation, they will determine when you should go in for an exam. ?During early labor, you may be able to rest and manage symptoms at home. Some strategies to try at home include: ?Breathing and relaxation techniques. ?Taking a warm bath or shower. ?Listening to music. ?Using a heating pad on the lower back for pain. If directed, apply heat to the area as often as told by your health care provider. Use the heat source that your health care provider recommends, such as a moist heat pack or a heating pad. ?Place a   towel between your skin and the heat source. ?Leave the heat on for 20-30 minutes. ?Remove the heat if your skin turns bright red. This is especially important if you are unable to feel pain, heat, or cold. You have a greater risk of getting burned. ?Contact a health care provider if: ?Your labor has started. ?Your water breaks. ?You have nausea, vomiting, or diarrhea. ?Get help right away  if: ?You have painful, regular contractions that are 5 minutes apart or less. ?Labor starts before you are [redacted] weeks along in your pregnancy. ?You have a fever. ?You have bright red blood coming from your vagina. ?You do not feel your baby moving. ?You have a severe headache with or without vision problems. ?You have chest pain or shortness of breath. ?These symptoms may represent a serious problem that is an emergency. Do not wait to see if the symptoms will go away. Get medical help right away. Call your local emergency services (911 in the [place] Do not drive yourself to the hospital. ?Summary ?Labor is your body's natural process of moving your baby and the placenta out of your uterus. ?The process of labor usually starts when your baby is full-term, between 39 and 40 weeks of pregnancy. ?When labor starts, or if your water breaks, call your health care provider or nurse care line. Based on your situation, they will determine when you should go in for an exam. ?This information is not intended to replace advice given to you by your health care provider. Make sure you discuss any questions you have with your health care provider. ?Document Revised: 02/06/2021 Document Reviewed: 02/06/2021 ?Elsevier Patient Education ? 2022 [employer] ? ?

## 2021-12-05 NOTE — Progress Notes (Signed)
ROB: She hasn't been sleeping well x 1 week. Baby is really active when she tries to sleep. Unable to leave urine sample at intake. ?

## 2021-12-05 NOTE — Progress Notes (Signed)
ROB: Patient noes that she has not been sleeping well lately. Notes some discomfort with sleeping, and more active fetal movement at night.  Discussed comfort measures. 36 week labs performed last visit, neg. Reviewed labor precautions.  RTC in 1 week. ? ?

## 2021-12-10 ENCOUNTER — Ambulatory Visit (INDEPENDENT_AMBULATORY_CARE_PROVIDER_SITE_OTHER): Payer: Managed Care, Other (non HMO) | Admitting: Obstetrics and Gynecology

## 2021-12-10 ENCOUNTER — Other Ambulatory Visit: Payer: Self-pay

## 2021-12-10 ENCOUNTER — Encounter: Payer: Self-pay | Admitting: Obstetrics and Gynecology

## 2021-12-10 VITALS — BP 120/71 | HR 88 | Wt 188.4 lb

## 2021-12-10 DIAGNOSIS — Z3483 Encounter for supervision of other normal pregnancy, third trimester: Secondary | ICD-10-CM

## 2021-12-10 DIAGNOSIS — Z3A37 37 weeks gestation of pregnancy: Secondary | ICD-10-CM

## 2021-12-10 LAB — POCT URINALYSIS DIPSTICK OB
Bilirubin, UA: NEGATIVE
Blood, UA: NEGATIVE
Glucose, UA: NEGATIVE
Ketones, UA: NEGATIVE
Leukocytes, UA: NEGATIVE
Nitrite, UA: NEGATIVE
POC,PROTEIN,UA: NEGATIVE
Spec Grav, UA: 1.015 (ref 1.010–1.025)
Urobilinogen, UA: 0.2 E.U./dL
pH, UA: 7 (ref 5.0–8.0)

## 2021-12-10 NOTE — Progress Notes (Signed)
ROB. Patient states fetal movement with pressure. She reports trouble sleeping lately. Patient states no questions or concerns at this time.   ?

## 2021-12-10 NOTE — Progress Notes (Signed)
ROB: Reports active baby-sometimes to the point of discomfort.  Denies contractions.  Requesting cervical check today. ?

## 2021-12-13 ENCOUNTER — Encounter: Payer: Managed Care, Other (non HMO) | Admitting: Obstetrics and Gynecology

## 2021-12-19 ENCOUNTER — Other Ambulatory Visit: Payer: Self-pay

## 2021-12-19 ENCOUNTER — Ambulatory Visit (INDEPENDENT_AMBULATORY_CARE_PROVIDER_SITE_OTHER): Payer: Managed Care, Other (non HMO) | Admitting: Obstetrics and Gynecology

## 2021-12-19 ENCOUNTER — Encounter: Payer: Self-pay | Admitting: Obstetrics and Gynecology

## 2021-12-19 VITALS — BP 116/73 | HR 83 | Wt 191.2 lb

## 2021-12-19 DIAGNOSIS — O26899 Other specified pregnancy related conditions, unspecified trimester: Secondary | ICD-10-CM

## 2021-12-19 DIAGNOSIS — R102 Pelvic and perineal pain: Secondary | ICD-10-CM

## 2021-12-19 DIAGNOSIS — Z3483 Encounter for supervision of other normal pregnancy, third trimester: Secondary | ICD-10-CM

## 2021-12-19 DIAGNOSIS — Z3A38 38 weeks gestation of pregnancy: Secondary | ICD-10-CM

## 2021-12-19 DIAGNOSIS — F439 Reaction to severe stress, unspecified: Secondary | ICD-10-CM

## 2021-12-19 LAB — POCT URINALYSIS DIPSTICK OB
Bilirubin, UA: NEGATIVE
Blood, UA: NEGATIVE
Glucose, UA: NEGATIVE
Ketones, UA: NEGATIVE
Leukocytes, UA: NEGATIVE
Nitrite, UA: NEGATIVE
POC,PROTEIN,UA: NEGATIVE
Spec Grav, UA: 1.01 (ref 1.010–1.025)
Urobilinogen, UA: 0.2 E.U./dL
pH, UA: 7 (ref 5.0–8.0)

## 2021-12-19 NOTE — Progress Notes (Signed)
ROB: Notes increased pelvic pressure but otherwise doing ok.  Reports breast and nipple sensitivity.  Does note some increased stress lately due to family issues. Also noting stress at job. Feels like she won't be able to work beyond this week. Advised to bring FMLA paperwork. RTC in 1 week.  ?

## 2021-12-19 NOTE — Patient Instructions (Signed)
Natural Cervical Ripening Supplements  Red Raspberry Leaf Tea 2-4 cups daily.  Start at [redacted] weeks gestation and continue until labor.     Evening Primrose Oil (1000 mg) Take twice daily - by mouth in the morning and vaginally at bedtime. (If previous C-section, take both doses by mouth) Start at 38 weeks and continue until labor    Blue Cohosh 1 capsule daily starting at [redacted] weeks gestation 2 capsules daily starting at [redacted] weeks gestation 3 capsules daily starting at [redacted] weeks gestation  

## 2021-12-19 NOTE — Progress Notes (Signed)
ROB: She is having an increased hunger, emotions, discharge, breast soreness and stress. She worries that the increased stress will cause her to go into labor. She also has had lots of pressure over the past week. ?

## 2021-12-20 ENCOUNTER — Encounter: Payer: Managed Care, Other (non HMO) | Admitting: Certified Nurse Midwife

## 2021-12-26 ENCOUNTER — Ambulatory Visit (INDEPENDENT_AMBULATORY_CARE_PROVIDER_SITE_OTHER): Payer: Managed Care, Other (non HMO) | Admitting: Obstetrics and Gynecology

## 2021-12-26 ENCOUNTER — Other Ambulatory Visit: Payer: Self-pay

## 2021-12-26 ENCOUNTER — Encounter: Payer: Self-pay | Admitting: Obstetrics and Gynecology

## 2021-12-26 VITALS — BP 116/69 | HR 68 | Wt 191.8 lb

## 2021-12-26 DIAGNOSIS — Z3483 Encounter for supervision of other normal pregnancy, third trimester: Secondary | ICD-10-CM

## 2021-12-26 DIAGNOSIS — Z3A39 39 weeks gestation of pregnancy: Secondary | ICD-10-CM

## 2021-12-26 LAB — POCT URINALYSIS DIPSTICK OB
Bilirubin, UA: NEGATIVE
Blood, UA: NEGATIVE
Glucose, UA: NEGATIVE
Ketones, UA: NEGATIVE
Leukocytes, UA: NEGATIVE
Nitrite, UA: NEGATIVE
POC,PROTEIN,UA: NEGATIVE
Spec Grav, UA: 1.015 (ref 1.010–1.025)
Urobilinogen, UA: 0.2 E.U./dL
pH, UA: 6.5 (ref 5.0–8.0)

## 2021-12-26 NOTE — Progress Notes (Signed)
ROB. Patient states fetal movement with pressure, back pain as well. Patient states no questions or concerns at this time.   ?

## 2021-12-26 NOTE — Progress Notes (Signed)
ROB: She is doing well.  She denies contractions.  Reports daily fetal movement.  She has reaffirmed her desire for vaginal birth (TOLAC).  Biophysical profile scheduled for Monday.  Induction for Wednesday at midnight. ?

## 2021-12-31 ENCOUNTER — Other Ambulatory Visit: Payer: Self-pay

## 2021-12-31 ENCOUNTER — Ambulatory Visit (INDEPENDENT_AMBULATORY_CARE_PROVIDER_SITE_OTHER): Payer: Managed Care, Other (non HMO)

## 2021-12-31 DIAGNOSIS — Z3A40 40 weeks gestation of pregnancy: Secondary | ICD-10-CM | POA: Diagnosis not present

## 2021-12-31 DIAGNOSIS — O480 Post-term pregnancy: Secondary | ICD-10-CM | POA: Diagnosis not present

## 2021-12-31 DIAGNOSIS — Z3A39 39 weeks gestation of pregnancy: Secondary | ICD-10-CM

## 2022-01-01 ENCOUNTER — Inpatient Hospital Stay: Payer: Managed Care, Other (non HMO) | Admitting: Anesthesiology

## 2022-01-01 ENCOUNTER — Other Ambulatory Visit: Payer: Self-pay

## 2022-01-01 ENCOUNTER — Inpatient Hospital Stay
Admission: EM | Admit: 2022-01-01 | Discharge: 2022-01-04 | DRG: 807 | Disposition: A | Discharge status: Home / Self Care | Payer: Managed Care, Other (non HMO) | Attending: Obstetrics and Gynecology | Admitting: Obstetrics and Gynecology

## 2022-01-01 ENCOUNTER — Encounter: Payer: Self-pay | Admitting: Obstetrics and Gynecology

## 2022-01-01 ENCOUNTER — Ambulatory Visit (INDEPENDENT_AMBULATORY_CARE_PROVIDER_SITE_OTHER): Payer: Managed Care, Other (non HMO) | Admitting: Obstetrics and Gynecology

## 2022-01-01 ENCOUNTER — Other Ambulatory Visit: Payer: Managed Care, Other (non HMO)

## 2022-01-01 VITALS — BP 113/73 | HR 67 | Wt 193.2 lb

## 2022-01-01 DIAGNOSIS — O9081 Anemia of the puerperium: Secondary | ICD-10-CM | POA: Diagnosis not present

## 2022-01-01 DIAGNOSIS — O26813 Pregnancy related exhaustion and fatigue, third trimester: Secondary | ICD-10-CM | POA: Diagnosis present

## 2022-01-01 DIAGNOSIS — Z98891 History of uterine scar from previous surgery: Secondary | ICD-10-CM

## 2022-01-01 DIAGNOSIS — Z3483 Encounter for supervision of other normal pregnancy, third trimester: Secondary | ICD-10-CM

## 2022-01-01 DIAGNOSIS — Z3A40 40 weeks gestation of pregnancy: Secondary | ICD-10-CM

## 2022-01-01 DIAGNOSIS — O480 Post-term pregnancy: Secondary | ICD-10-CM | POA: Diagnosis present

## 2022-01-01 DIAGNOSIS — O34219 Maternal care for unspecified type scar from previous cesarean delivery: Secondary | ICD-10-CM | POA: Diagnosis present

## 2022-01-01 DIAGNOSIS — O3429 Maternal care due to uterine scar from other previous surgery: Secondary | ICD-10-CM | POA: Diagnosis not present

## 2022-01-01 DIAGNOSIS — D649 Anemia, unspecified: Secondary | ICD-10-CM | POA: Diagnosis not present

## 2022-01-01 LAB — POCT URINALYSIS DIPSTICK OB
Bilirubin, UA: NEGATIVE
Blood, UA: NEGATIVE
Glucose, UA: NEGATIVE
Ketones, UA: NEGATIVE
Leukocytes, UA: NEGATIVE
Nitrite, UA: NEGATIVE
POC,PROTEIN,UA: NEGATIVE
Spec Grav, UA: 1.015 (ref 1.010–1.025)
Urobilinogen, UA: 0.2 E.U./dL
pH, UA: 6.5 (ref 5.0–8.0)

## 2022-01-01 LAB — CBC
HCT: 38.1 % (ref 36.0–46.0)
Hemoglobin: 13 g/dL (ref 12.0–15.0)
MCH: 30.2 pg (ref 26.0–34.0)
MCHC: 34.1 g/dL (ref 30.0–36.0)
MCV: 88.4 fL (ref 80.0–100.0)
Platelets: 214 10*3/uL (ref 150–400)
RBC: 4.31 MIL/uL (ref 3.87–5.11)
RDW: 12.6 % (ref 11.5–15.5)
WBC: 11.3 10*3/uL — ABNORMAL HIGH (ref 4.0–10.5)
nRBC: 0 % (ref 0.0–0.2)

## 2022-01-01 LAB — FETAL NONSTRESS TEST

## 2022-01-01 MED ORDER — ONDANSETRON HCL 4 MG/2ML IJ SOLN: 4.0000 mg | Freq: Four times a day (QID) | INTRAMUSCULAR | Status: DC | PRN

## 2022-01-01 MED ORDER — LACTATED RINGERS IV SOLN: INTRAVENOUS | Status: DC

## 2022-01-01 MED ORDER — DIPHENHYDRAMINE HCL 50 MG/ML IJ SOLN: 12.5000 mg | INTRAMUSCULAR | Status: DC | PRN

## 2022-01-01 MED ORDER — PHENYLEPHRINE 40 MCG/ML (10ML) SYRINGE FOR IV PUSH (FOR BLOOD PRESSURE SUPPORT): 80.0000 ug | PREFILLED_SYRINGE | INTRAVENOUS | Status: DC | PRN

## 2022-01-01 MED ORDER — SOD CITRATE-CITRIC ACID 500-334 MG/5ML PO SOLN: 30.0000 mL | ORAL | Status: DC | PRN

## 2022-01-01 MED ORDER — FENTANYL-BUPIVACAINE-NACL 0.5-0.125-0.9 MG/250ML-% EP SOLN
EPIDURAL | Status: AC
  Filled 2022-01-01: qty 250

## 2022-01-01 MED ORDER — EPHEDRINE 5 MG/ML INJ: 10.0000 mg | INTRAVENOUS | Status: DC | PRN

## 2022-01-01 MED ORDER — OXYTOCIN-SODIUM CHLORIDE 30-0.9 UT/500ML-% IV SOLN
1.0000 m[IU]/min | INTRAVENOUS | Status: DC
  Administered 2022-01-02: 2 m[IU]/min via INTRAVENOUS

## 2022-01-01 MED ORDER — ACETAMINOPHEN 325 MG PO TABS
650.0000 mg | ORAL_TABLET | ORAL | Status: DC | PRN
Start: 2022-01-01 — End: 2022-01-02
  Administered 2022-01-02: 650 mg via ORAL
  Filled 2022-01-01: qty 2

## 2022-01-01 MED ORDER — FENTANYL-BUPIVACAINE-NACL 0.5-0.125-0.9 MG/250ML-% EP SOLN: 12.0000 mL/h | EPIDURAL | Status: DC | PRN

## 2022-01-01 MED ORDER — BUTORPHANOL TARTRATE 1 MG/ML IJ SOLN: 1.0000 mg | INTRAMUSCULAR | Status: DC | PRN

## 2022-01-01 MED ORDER — LACTATED RINGERS IV SOLN: 500.0000 mL | INTRAVENOUS | Status: DC | PRN

## 2022-01-01 MED ORDER — LACTATED RINGERS IV SOLN
500.0000 mL | Freq: Once | INTRAVENOUS | Status: AC
  Administered 2022-01-01: 500 mL via INTRAVENOUS

## 2022-01-01 MED ORDER — OXYTOCIN-SODIUM CHLORIDE 30-0.9 UT/500ML-% IV SOLN
2.5000 [IU]/h | INTRAVENOUS | Status: DC
Start: 2022-01-01 — End: 2022-01-04
  Administered 2022-01-02: 2.5 [IU]/h via INTRAVENOUS
  Filled 2022-01-01: qty 1000

## 2022-01-01 MED ORDER — OXYTOCIN BOLUS FROM INFUSION: 333.0000 mL | Freq: Once | INTRAVENOUS | Status: AC

## 2022-01-01 MED ORDER — LIDOCAINE HCL (PF) 1 % IJ SOLN: 30.0000 mL | INTRAMUSCULAR | Status: DC | PRN

## 2022-01-01 MED ORDER — LIDOCAINE-EPINEPHRINE (PF) 1.5 %-1:200000 IJ SOLN
INTRAMUSCULAR | Status: DC | PRN
  Administered 2022-01-01: 3 mL via EPIDURAL

## 2022-01-01 MED ORDER — LIDOCAINE HCL (PF) 1 % IJ SOLN
INTRAMUSCULAR | Status: DC | PRN
  Administered 2022-01-01: 2 mL via SUBCUTANEOUS

## 2022-01-01 MED ORDER — FENTANYL-BUPIVACAINE-NACL 0.5-0.125-0.9 MG/250ML-% EP SOLN
EPIDURAL | Status: DC | PRN
  Administered 2022-01-01: 12 mL/h via EPIDURAL

## 2022-01-01 MED ORDER — SODIUM CHLORIDE 0.9 % IV SOLN
INTRAVENOUS | Status: DC | PRN
  Administered 2022-01-01 (×2): 5 mL via EPIDURAL

## 2022-01-01 NOTE — Patient Instructions (Signed)

## 2022-01-01 NOTE — Anesthesia Procedure Notes (Signed)
Epidural ?Patient location during procedure: OB ?Start time: 01/01/2022 10:54 PM ?End time: 01/01/2022 10:59 PM ? ?Staffing ?Anesthesiologist: Lenard Simmer, MD ?Performed: anesthesiologist  ? ?Preanesthetic Checklist ?Completed: patient identified, IV checked, site marked, risks and benefits discussed, surgical consent, monitors and equipment checked, pre-op evaluation and timeout performed ? ?Epidural ?Patient position: sitting ?Prep: ChloraPrep ?Patient monitoring: heart rate, continuous pulse ox and blood pressure ?Approach: midline ?Location: L3-L4 ?Injection technique: LOR saline ? ?Needle:  ?Needle type: Tuohy  ?Needle gauge: 17 G ?Needle length: 9 cm and 9 ?Needle insertion depth: 5 cm ?Catheter type: closed end flexible ?Catheter size: 19 Gauge ?Catheter at skin depth: 10 cm ?Test dose: negative and 1.5% lidocaine with Epi 1:200 K ? ?Assessment ?Sensory level: T10 ?Events: blood not aspirated, injection not painful, no injection resistance, no paresthesia and negative IV test ? ?Additional Notes ?1st attempt ?Pt. Evaluated and documentation done after procedure finished. ?Patient identified. Risks/Benefits/Options discussed with patient including but not limited to bleeding, infection, nerve damage, paralysis, failed block, incomplete pain control, headache, blood pressure changes, nausea, vomiting, reactions to medication both or allergic, itching and postpartum back pain. Confirmed with bedside nurse the patient's most recent platelet count. Confirmed with patient that they are not currently taking any anticoagulation, have any bleeding history or any family history of bleeding disorders. Patient expressed understanding and wished to proceed. All questions were answered. Sterile technique was used throughout the entire procedure. Please see nursing notes for vital signs. Test dose was given through epidural catheter and negative prior to continuing to dose epidural or start infusion. Warning signs of high  block given to the patient including shortness of breath, tingling/numbness in hands, complete motor block, or any concerning symptoms with instructions to call for help. Patient was given instructions on fall risk and not to get out of bed. All questions and concerns addressed with instructions to call with any issues or inadequate analgesia.   ? ?Patient tolerated the insertion well without immediate complications.Reason for block:procedure for pain ? ? ? ?

## 2022-01-01 NOTE — Progress Notes (Signed)
ROB: She had severe pressure yesterday that radiated into her right leg, only lasted about 5 minutes and then it resolved. ?

## 2022-01-01 NOTE — Progress Notes (Signed)
ROB: Patient doing well. Notes she is over being pregnant.  Is beginning to have concerns about being induced as her last pregnancy ended with a C-section after induction.  Discussed concerns with patient. Offered foley bulb vs Dilapan insertion to help with cervical ripening. Patient ok to have foley bulb. Scheduled for IOL this tomorrow (midnight). Reviewed ultrasound performed yesterday, AFI 7.4 cm, vertex, BPP 8/8.  ? ? ?FOLEY BULB PROCEDURE NOTE:  ? ? ?I have verified that the patient is an appropriate candidate for outpatient foley bulb placement.  She has consented to foley bulb placement today. She has no concerns at this time. A pre-procedure NST performed today was reviewed and was found to be reactive.   ? ? ?The patient was placed in the dorsal lithotomy position.  A speculum was inserted into the vagina and the cervix was visualized  The cervix was noted to be 0.5 cm dilated  A foley catheter was advanced into the cervix slightly pass the internal cervical os.  The foley balloon was filled with 40 cc of normal saline.  Gentle traction was placed on the foley bulb, and the catheter was taped to the medial portion of the thigh.  The patient tolerated the procedure well.  A post-procedure NST was performed. Sterile technique was observed throughout.  ? ? ? ? ?NONSTRESS TEST INTERPRETATION ? ?INDICATIONS: Foley bulb induction placement ? ?FHR baseline: 140 bpm (pre-procedure) and 150  bpm (post procedure) ?RESULTS:Reactive x 2.  ?COMMENTS: 1 contraction, uterine irritability ? ? ?PLAN: ?1. To arrive at Labor and Delivery for scheduled induction of labor on (01/02/22 at midnight. Arrive to the ER sooner if labor ensues. She was given post-procedure instructions.  ? ? ? ? ?

## 2022-01-01 NOTE — Anesthesia Preprocedure Evaluation (Signed)
Anesthesia Evaluation  ?Patient identified by MRN, date of birth, ID band ?Patient awake ? ? ? ?Reviewed: ?Allergy & Precautions, H&P , NPO status , Patient's Chart, lab work & pertinent test results, reviewed documented beta blocker date and time  ? ?History of Anesthesia Complications ?Negative for: history of anesthetic complications ? ?Airway ?Mallampati: I ? ?TM Distance: >3 FB ?Neck ROM: full ? ? ? Dental ? ?(+) Dental Advidsory Given, Teeth Intact ?  ?Pulmonary ?neg pulmonary ROS,  ?  ?Pulmonary exam normal ?breath sounds clear to auscultation ? ? ? ? ? ? Cardiovascular ?Exercise Tolerance: Good ?negative cardio ROS ?Normal cardiovascular exam ?Rhythm:regular Rate:Normal ? ? ?  ?Neuro/Psych ? Headaches, neg Seizures  Neuromuscular disease negative psych ROS  ? GI/Hepatic ?negative GI ROS, Neg liver ROS,   ?Endo/Other  ?negative endocrine ROS ? Renal/GU ?negative Renal ROS  ?negative genitourinary ?  ?Musculoskeletal ? ? Abdominal ?  ?Peds ? Hematology ?negative hematology ROS ?(+)   ?Anesthesia Other Findings ?Past Medical History: ?08/20/2018: Diastasis recti ?No date: Headache ?05/16/2017: STD (female) ?    Comment:  chlmydia ? ? Reproductive/Obstetrics ?negative OB ROS ? ?  ? ? ? ? ? ? ? ? ? ? ? ? ? ?  ?  ? ? ? ? ? ? ? ? ?Anesthesia Physical ?Anesthesia Plan ? ?ASA: 2 ? ?Anesthesia Plan: Epidural  ? ?Post-op Pain Management:   ? ?Induction:  ? ?PONV Risk Score and Plan:  ? ?Airway Management Planned:  ? ?Additional Equipment:  ? ?Intra-op Plan:  ? ?Post-operative Plan:  ? ?Informed Consent: I have reviewed the patients History and Physical, chart, labs and discussed the procedure including the risks, benefits and alternatives for the proposed anesthesia with the patient or authorized representative who has indicated his/her understanding and acceptance.  ? ? ? ?Dental Advisory Given ? ?Plan Discussed with: Anesthesiologist, CRNA and Surgeon ? ?Anesthesia Plan Comments:    ? ? ? ? ? ? ?Anesthesia Quick Evaluation ? ?

## 2022-01-02 ENCOUNTER — Encounter: Payer: Self-pay | Admitting: Obstetrics and Gynecology

## 2022-01-02 DIAGNOSIS — D649 Anemia, unspecified: Secondary | ICD-10-CM

## 2022-01-02 DIAGNOSIS — O9081 Anemia of the puerperium: Secondary | ICD-10-CM

## 2022-01-02 DIAGNOSIS — Z3A40 40 weeks gestation of pregnancy: Secondary | ICD-10-CM

## 2022-01-02 DIAGNOSIS — O34219 Maternal care for unspecified type scar from previous cesarean delivery: Secondary | ICD-10-CM

## 2022-01-02 DIAGNOSIS — O480 Post-term pregnancy: Principal | ICD-10-CM

## 2022-01-02 LAB — RAPID HIV SCREEN (HIV 1/2 AB+AG)
HIV 1/2 Antibodies: NONREACTIVE
HIV-1 P24 Antigen - HIV24: NONREACTIVE

## 2022-01-02 LAB — RPR: RPR Ser Ql: NONREACTIVE

## 2022-01-02 MED ORDER — METHYLERGONOVINE MALEATE 0.2 MG/ML IJ SOLN
INTRAMUSCULAR | Status: AC
  Filled 2022-01-02: qty 1

## 2022-01-02 MED ORDER — COCONUT OIL OIL
1.0000 "application " | TOPICAL_OIL | Status: DC | PRN
  Administered 2022-01-02: 1 via TOPICAL
  Filled 2022-01-02: qty 120

## 2022-01-02 MED ORDER — OXYCODONE HCL 5 MG PO TABS
5.0000 mg | ORAL_TABLET | ORAL | Status: DC | PRN
  Administered 2022-01-02: 5 mg via ORAL
  Filled 2022-01-02: qty 1

## 2022-01-02 MED ORDER — ONDANSETRON HCL 4 MG/2ML IJ SOLN
4.0000 mg | INTRAMUSCULAR | Status: DC | PRN
Start: 2022-01-02 — End: 2022-01-04
  Administered 2022-01-02: 4 mg via INTRAVENOUS
  Filled 2022-01-02: qty 2

## 2022-01-02 MED ORDER — MISOPROSTOL 200 MCG PO TABS
ORAL_TABLET | ORAL | Status: AC
  Filled 2022-01-02: qty 4

## 2022-01-02 MED ORDER — SENNOSIDES-DOCUSATE SODIUM 8.6-50 MG PO TABS
2.0000 | ORAL_TABLET | Freq: Every day | ORAL | Status: DC
  Administered 2022-01-04: 2 via ORAL
  Filled 2022-01-02 (×2): qty 2

## 2022-01-02 MED ORDER — ACETAMINOPHEN 325 MG PO TABS
650.0000 mg | ORAL_TABLET | ORAL | Status: DC | PRN
  Administered 2022-01-02 – 2022-01-03 (×5): 650 mg via ORAL
  Filled 2022-01-02 (×6): qty 2

## 2022-01-02 MED ORDER — OXYCODONE HCL 5 MG PO TABS: 10.0000 mg | ORAL_TABLET | ORAL | Status: DC | PRN

## 2022-01-02 MED ORDER — SIMETHICONE 80 MG PO CHEW: 80.0000 mg | CHEWABLE_TABLET | ORAL | Status: DC | PRN

## 2022-01-02 MED ORDER — BENZOCAINE-MENTHOL 20-0.5 % EX AERO
1.0000 "application " | INHALATION_SPRAY | CUTANEOUS | Status: DC | PRN
  Filled 2022-01-02: qty 56

## 2022-01-02 MED ORDER — DIBUCAINE (PERIANAL) 1 % EX OINT: 1.0000 "application " | TOPICAL_OINTMENT | CUTANEOUS | Status: DC | PRN

## 2022-01-02 MED ORDER — IBUPROFEN 600 MG PO TABS
600.0000 mg | ORAL_TABLET | Freq: Four times a day (QID) | ORAL | Status: DC
  Administered 2022-01-02 – 2022-01-04 (×9): 600 mg via ORAL
  Filled 2022-01-02 (×9): qty 1

## 2022-01-02 MED ORDER — CARBOPROST TROMETHAMINE 250 MCG/ML IM SOLN
INTRAMUSCULAR | Status: AC
  Filled 2022-01-02: qty 1

## 2022-01-02 MED ORDER — PRENATAL MULTIVITAMIN CH
1.0000 | ORAL_TABLET | Freq: Every day | ORAL | Status: DC
  Administered 2022-01-02 – 2022-01-04 (×3): 1 via ORAL
  Filled 2022-01-02 (×3): qty 1

## 2022-01-02 MED ORDER — SOD CITRATE-CITRIC ACID 500-334 MG/5ML PO SOLN
ORAL | Status: AC
  Filled 2022-01-02: qty 15

## 2022-01-02 MED ORDER — OXYTOCIN-SODIUM CHLORIDE 30-0.9 UT/500ML-% IV SOLN
INTRAVENOUS | Status: AC
  Administered 2022-01-02: 333 mL via INTRAVENOUS
  Filled 2022-01-02: qty 500

## 2022-01-02 MED ORDER — WITCH HAZEL-GLYCERIN EX PADS
1.0000 "application " | MEDICATED_PAD | CUTANEOUS | Status: DC | PRN
  Administered 2022-01-02: 1 via TOPICAL
  Filled 2022-01-02: qty 100

## 2022-01-02 MED ORDER — BUPIVACAINE HCL (PF) 0.5 % IJ SOLN
INTRAMUSCULAR | Status: AC
  Filled 2022-01-02: qty 30

## 2022-01-02 MED ORDER — OXYTOCIN 10 UNIT/ML IJ SOLN
INTRAMUSCULAR | Status: AC
  Filled 2022-01-02: qty 2

## 2022-01-02 MED ORDER — TRANEXAMIC ACID-NACL 1000-0.7 MG/100ML-% IV SOLN
INTRAVENOUS | Status: AC
  Filled 2022-01-02: qty 100

## 2022-01-02 MED ORDER — AMMONIA AROMATIC IN INHA
RESPIRATORY_TRACT | Status: AC
  Filled 2022-01-02: qty 10

## 2022-01-02 MED ORDER — LACTATED RINGERS AMNIOINFUSION
INTRAVENOUS | Status: DC
  Filled 2022-01-02: qty 1000

## 2022-01-02 MED ORDER — LIDOCAINE HCL (PF) 1 % IJ SOLN
INTRAMUSCULAR | Status: AC
  Filled 2022-01-02: qty 30

## 2022-01-02 MED ORDER — ONDANSETRON HCL 4 MG PO TABS: 4.0000 mg | ORAL_TABLET | ORAL | Status: DC | PRN

## 2022-01-02 MED ORDER — ZOLPIDEM TARTRATE 5 MG PO TABS: 5.0000 mg | ORAL_TABLET | Freq: Every evening | ORAL | Status: DC | PRN

## 2022-01-02 MED ORDER — SODIUM CHLORIDE (PF) 0.9 % IJ SOLN
INTRAMUSCULAR | Status: AC
  Filled 2022-01-02: qty 50

## 2022-01-02 MED ORDER — DIPHENHYDRAMINE HCL 25 MG PO CAPS: 25.0000 mg | ORAL_CAPSULE | Freq: Four times a day (QID) | ORAL | Status: DC | PRN

## 2022-01-02 MED ORDER — BUPIVACAINE LIPOSOME 1.3 % IJ SUSP
INTRAMUSCULAR | Status: AC
  Filled 2022-01-02: qty 20

## 2022-01-02 NOTE — Plan of Care (Signed)
SP VBAC, viable female infant,  Infant to NICU, ?

## 2022-01-03 DIAGNOSIS — Z3A40 40 weeks gestation of pregnancy: Secondary | ICD-10-CM

## 2022-01-03 DIAGNOSIS — D649 Anemia, unspecified: Secondary | ICD-10-CM

## 2022-01-03 DIAGNOSIS — O480 Post-term pregnancy: Principal | ICD-10-CM

## 2022-01-03 DIAGNOSIS — O9081 Anemia of the puerperium: Secondary | ICD-10-CM

## 2022-01-03 DIAGNOSIS — O34219 Maternal care for unspecified type scar from previous cesarean delivery: Secondary | ICD-10-CM

## 2022-01-03 LAB — BPAM RBC
Blood Product Expiration Date: 202304212359
Blood Product Expiration Date: 202304212359
Unit Type and Rh: 7300
Unit Type and Rh: 7300

## 2022-01-03 LAB — CBC
HCT: 28.9 % — ABNORMAL LOW (ref 36.0–46.0)
Hemoglobin: 9.7 g/dL — ABNORMAL LOW (ref 12.0–15.0)
MCH: 30.4 pg (ref 26.0–34.0)
MCHC: 33.6 g/dL (ref 30.0–36.0)
MCV: 90.6 fL (ref 80.0–100.0)
Platelets: 165 10*3/uL (ref 150–400)
RBC: 3.19 MIL/uL — ABNORMAL LOW (ref 3.87–5.11)
RDW: 13 % (ref 11.5–15.5)
WBC: 17 10*3/uL — ABNORMAL HIGH (ref 4.0–10.5)
nRBC: 0 % (ref 0.0–0.2)

## 2022-01-03 LAB — TYPE AND SCREEN
ABO/RH(D): B POS
Antibody Screen: POSITIVE
Unit division: 0
Unit division: 0

## 2022-01-03 NOTE — Anesthesia Postprocedure Evaluation (Signed)
Anesthesia Post Note ? ?Patient: Tara Baldwin ? ?Procedure(s) Performed: AN AD HOC LABOR EPIDURAL ? ?Patient location during evaluation: Mother Baby ?Anesthesia Type: Epidural ?Level of consciousness: awake and alert ?Pain management: pain level controlled ?Vital Signs Assessment: post-procedure vital signs reviewed and stable ?Respiratory status: spontaneous breathing, nonlabored ventilation and respiratory function stable ?Cardiovascular status: stable ?Postop Assessment: no headache, no backache and epidural receding ?Anesthetic complications: no ? ? ?No notable events documented. ? ? ?Last Vitals:  ?Vitals:  ? 01/03/22 0005 01/03/22 0415  ?BP: 106/70 130/60  ?Pulse: 74 67  ?Resp: 18 18  ?Temp: 36.7 ?C 37.1 ?C  ?SpO2: 100% 98%  ?  ?Last Pain:  ?Vitals:  ? 01/03/22 0708  ?TempSrc:   ?PainSc: 0-No pain  ? ? ?  ?  ?  ?  ?  ?  ? ?[name] ? ? ? ? ?

## 2022-01-03 NOTE — Progress Notes (Addendum)
Intrapartum Progress Note ? ?Called to assess patient as prolonged deceleration present, lasting for 4 minutes from baseline 140s to 60s.  Upon my arrival, patient in hands and knees position, with fetal heart rate recovery.  ? ?S: Patient herself without complaints. Comfortable with epidural.   ? ?O: Blood pressure 110/64, pulse 69, temperature 97.9 ?F (36.6 ?C), temperature source Oral, resp. rate 20, height 5\' 6"  (1.676 m), weight 87.5 kg, last menstrual period 03/06/2021, SpO2 100 %, unknown if currently breastfeeding. ? ?Gen App: NAD, hands and knees position.  ?Abdomen: soft, gravid ?FHT: baseline 135 bpm.  Accels present.  Decels  prolonged deceleration lasting 4 min . moderate in degree variability.   ?Tocometer: contractions q 2 minutes with some coupling.  ?Cervix: 5.5/80-90/+1. Amnioinfusion running.  ?Extremities: Nontender, no edema. ? ?Pitocin: turned off (previously on 4 mIU) ? ?Labs: no new labs ? ? ?Assessment:  ?1: SIUP at [redacted]w[redacted]d ?2. Moderate meconium ?3. Category II tracing ? ?Plan:  ?1. Patient's cervix pliable, can likely push to complete, fetal head well applied. Anticipate vaginal delivery.  ? ? ?[redacted]w[redacted]d, MD ? ? ? ?

## 2022-01-03 NOTE — Progress Notes (Signed)
Post Partum Day # 1, s/p SVD (vacuum-assisted) ? ?Subjective: ?up ad lib, voiding, and tolerating PO.  Does note some mild back pain at times.  Is working on pumping for infant in NICU. Not noting much production yet. Also feels a lump on her left breast. Notes bleeding is normal.  ? ?Objective: ?Temp:  [97.4 ?F (36.3 ?C)-98.8 ?F (37.1 ?C)] 98.6 ?F (37 ?C) (03/30 0815) ?Pulse Rate:  [63-77] 63 (03/30 0804) ?Resp:  [18-20] 20 (03/30 0804) ?BP: (101-130)/(57-70) 101/57 (03/30 0804) ?SpO2:  [98 %-100 %] 99 % (03/30 0804) ? ?Physical Exam:  ?General: alert and no distress  ?Lungs: clear to auscultation bilaterally ?Breasts: normal appearance, no masses or tenderness. Unable to palpate lump at this time on left breast.  ?Heart: regular rate and rhythm, S1, S2 normal, no murmur, click, rub or gallop ?Abdomen: soft, non-tender; bowel sounds normal; no masses,  no organomegaly ?Pelvis: Lochia: appropriate, Uterine Fundus: firm ?Extremities: DVT Evaluation: No evidence of DVT seen on physical exam. Negative Homan's sign. No cords or calf tenderness. No significant calf/ankle edema. ? ?Recent Labs  ?  01/01/22 ?2103 01/03/22 ?9518  ?HGB 13.0 9.7*  ?HCT 38.1 28.9*  ? ? ?Assessment/Plan: ?Doing well postpartum ?Breastfeeding/pumping, Lactation consult ?Circumcision prior to discharge (however infant currently in NICU for meconium aspiration and mild subgaleal hematoma)  ?Contraception combined OCPs ?Mild anemia postpartum, asymptomatic.   ?Discharge home tomorrow.  ? ? LOS: 2 days  ? ?Hildred Laser, MD ?[facility] ?01/03/2022 11:40 AM ? ? ? ?

## 2022-01-03 NOTE — Lactation Note (Signed)
This note was copied from a baby's chart. ?Lactation Consultation Note ? ?Patient Name: Tara Baldwin ?Today's Date: 01/03/2022 ?Reason for consult: Initial assessment;Term;Other (Comment) (Baby in SCN) ?Age:30 hours ? ?Maternal Data ? ?This is mom's 2nd baby. Baby in SCN r/t perinatal depression and respiratory support/ care. Met with mother in her room. She is concerned she is not obtaining any measurable amounts of colostrum. She also has concerns of nipple pain when pumping. ? ?Has patient been taught Hand Expression?: Yes ?Does the patient have breastfeeding experience prior to this delivery?: Yes ?How long did the patient breastfeed?: 3 weeks. Mom reports she felt stressed post delivery with her first child as she was having pain issues in the post partum period r/t having a C/S and tried to breastfeed for 3 weeks. She also had pumped and the most she could express at a pump session was 1.5 ounces at 3 weeks which discouraged her. ? ?Feeding ?Mother's Current Feeding Choice:  (Mom's current choice is donor breastmilk until she can obtain measureable amounts of breastmilk. Mom pumping every 3 hours.) ? ?Interventions ?Interventions: Education (Reviewed pumping, milk storage, cleaning of breastpump parts, transport of breastmilk.) ?Observed pumping session. Flange size is appropriate. Mom needed assistance of correctly placing flange on her breast to center nipple not to rub on the plastic. Once shown she was comfortable and did not experience pain. Nipples are intact. Mom was able to express a drop from each nipple. Reviewed hand expression techniques to use in conjunction with pumping. Encouraged mom to take in adequate fluids. Also, mom reports painful cramping while pumping. Care nurse to room to provide tylenol. ? ?Discharge ?Pump: DEBP;Personal (Mom has double electric pump(lansinoh). Discussed with mom difference of pumps to establish vs. maintain milk supply. Mom going to call insurance co to check  eligibility for hospital grade pump. She understands she can rent a hospital grade pump at [facility].) ? ?Consult Status ?Consult Status: Follow-up ?Date: 01/04/22 ?Follow-up type: In-patient ? ? ? ?Jonna Caylen Yardley ?01/03/2022, 5:24 PM ? ? ? ?

## 2022-01-03 NOTE — H&P (Signed)
Obstetric History and Physical ? ?Tara Baldwin is a 31 y.o. G2P1001 with IUP at [redacted]w[redacted]d presenting last night for vaginal bleeding after office foley bulb insertion yesterday in clinic.  Was scheduled for IOL today.  Upon assessment, foley bulb noted to expel, with rupture of membranes while in triage. Patient states she has been having  regular, every 2-3 minutes contractions, minimal vaginal bleeding, ruptured moderate meconium membranes, with active fetal movement.  Patient's OB history significant for history of C/S x 1 (for failure to progress at 7 cm), desiring TOLAC ? ?Prenatal Course ?Source of Care: [facility] with onset of care at 19 weeks ?Pregnancy complications or risks: ?Patient Active Problem List  ? Diagnosis Date Noted  ? Post-dates pregnancy 01/01/2022  ? Constipation during pregnancy in second trimester 07/25/2021  ? Diastasis recti 08/20/2018  ? History of C-section 12/30/2017  ? Left flank pain 12/14/2017  ? Breast lump on right side at 11 o'clock position 10/09/2017  ? ?She plans to breastfeed ?She desires oral contraceptives (estrogen/progesterone) for postpartum contraception.  ? ?Prenatal labs and studies: ?ABO, Rh: --/--/B POS (03/28 2140) ?Antibody: POS (03/28 2140) ?Rubella: 3.88 (08/12 1147) ?RPR: NON REACTIVE (03/28 2103)  ?HBsAg: Negative (08/12 1147)  ?HIV: NON REACTIVE (03/28 2103)  ?[id number] (02/23 1641) ?1 hr Glucola  normal ?Genetic screening normal ?Anatomy US normal ? ? ?Past Medical History:  ?Diagnosis Date  ? Diastasis recti 08/20/2018  ? Headache   ? STD (female) 05/16/2017  ? chlmydia  ? ? ?Past Surgical History:  ?Procedure Laterality Date  ? CESAREAN SECTION N/A 12/19/2017  ? Procedure: CESAREAN SECTION;  Surgeon: Brayton Mars, MD;  Location: [facility] ORS;  Service: Obstetrics;  Laterality: N/A;  ? ? ?OB History  ?Gravida Para Term Preterm AB Living  ?2 2 2     2   ?SAB IAB Ectopic Multiple Live Births  ?      0 2  ?  ?# Outcome Date GA Lbr Len/2nd  Weight Sex Delivery Anes PTL Lv  ?2 Term 01/02/22 [redacted]w[redacted]d / 02:29 3190 g M VBAC, Vacuum EPI  LIV  ?1 Term 12/19/17 [redacted]w[redacted]d  3200 g M CS-LTranv EPI  LIV  ? ? ?Social History  ? ?Socioeconomic History  ? Marital status: Married  ?  Spouse name: Not on file  ? Number of children: Not on file  ? Years of education: Not on file  ? Highest education level: Not on file  ?Occupational History  ? Not on file  ?Tobacco Use  ? Smoking status: Never  ?  Passive exposure: Past  ? Smokeless tobacco: Never  ?Vaping Use  ? Vaping Use: Never used  ?Substance and Sexual Activity  ? Alcohol use: Not Currently  ?  Comment: occasional   ? Drug use: No  ? Sexual activity: Yes  ?  Comment: Pregnant  ?Other Topics Concern  ? Not on file  ?Social History Narrative  ? Not on file  ? ?Social Determinants of Health  ? ?Financial Resource Strain: Not on file  ?Food Insecurity: Not on file  ?Transportation Needs: Not on file  ?Physical Activity: Not on file  ?Stress: Not on file  ?Social Connections: Not on file  ? ? ?Family History  ?Problem Relation Age of Onset  ? Hypertension Father   ? Breast cancer Paternal Grandmother   ? Ovarian cancer Neg Hx   ? Colon cancer Neg Hx   ? ? ?Medications Prior to Admission  ?Medication Sig Dispense Refill Last Dose  ?  polyethylene glycol powder (GLYCOLAX/MIRALAX) 17 GM/SCOOP powder Take 17 g by mouth daily as needed for mild constipation or moderate constipation. 255 g 2 Past Week  ? Prenatal Vit-Fe Fumarate-FA (PRENATAL VITAMINS) 28-0.8 MG TABS Take by mouth.   01/01/2022  ? ? ?No Known Allergies ? ?Review of Systems: Negative except for what is mentioned in HPI. ? ?Physical Exam: ?BP (!) 101/57 (BP Location: Right Arm)   Pulse 63   Temp 98.6 ?F (37 ?C) (Oral)   Resp 20   Ht 5\' 6"  (1.676 m)   Wt 87.5 kg   LMP 03/06/2021   SpO2 99%   Breastfeeding Unknown   BMI 31.15 kg/m?  ?CONSTITUTIONAL: Well-developed, well-nourished female in no acute distress.  ?HENT:  Normocephalic, atraumatic, External right and  left ear normal. Oropharynx is clear and moist ?EYES: Conjunctivae and EOM are normal. Pupils are equal, round, and reactive to light. No scleral icterus.  ?NECK: Normal range of motion, supple, no masses ?SKIN: Skin is warm and dry. No rash noted. Not diaphoretic. No erythema. No pallor. ?NEUROLOGIC: Alert and oriented to person, place, and time. Normal reflexes, muscle tone coordination. No cranial nerve deficit noted. ?PSYCHIATRIC: Normal mood and affect. Normal behavior. Normal judgment and thought content. ?CARDIOVASCULAR: Normal heart rate noted, regular rhythm ?RESPIRATORY: Effort and breath sounds normal, no problems with respiration noted ?ABDOMEN: Soft, nontender, nondistended, gravid. ?MUSCULOSKELETAL: Normal range of motion. No edema and no tenderness. 2+ distal pulses. ? ?Cervical Exam: Dilatation 4.5 cm   Effacement 60%   Station 0 to +1   ?Presentation: cephalic ?FHT:  Baseline rate 140 bpm   Variability moderate  Accelerations present   Decelerations intermittent variable (shallow) with 2 prolonged decelerations lasting for 3 and 4 minutes at 0042 and 0101 respectively from baseline to 60s and 40's respectively.  ?Contractions: Every 1-2 mins, irregular ?  ?Pertinent Labs/Studies:   ?Results for orders placed or performed during the hospital encounter of 01/01/22 (from the past 24 hour(s))  ?CBC     Status: Abnormal  ? Collection Time: 01/03/22  4:37 AM  ?Result Value Ref Range  ? WBC 17.0 (H) 4.0 - 10.5 K/uL  ? RBC 3.19 (L) 3.87 - 5.11 MIL/uL  ? Hemoglobin 9.7 (L) 12.0 - 15.0 g/dL  ? HCT 28.9 (L) 36.0 - 46.0 %  ? MCV 90.6 80.0 - 100.0 fL  ? MCH 30.4 26.0 - 34.0 pg  ? MCHC 33.6 30.0 - 36.0 g/dL  ? RDW 13.0 11.5 - 15.5 %  ? Platelets 165 150 - 400 K/uL  ? nRBC 0.0 0.0 - 0.2 %  ? ? ?Assessment : ?Tara Baldwin is a 31 y.o. G2P1001 at [redacted]w[redacted]d being admitted for linduction of labor due to postdates pregnancy. History of prior C-section x 1 desiring TOLAC. ? ?Plan: ?Labor: Augmentation with Pitocin as  ordered as per protocol once fetal status more reassuring.  IUPC placed for amnioinfusion.  Is s/p epidural placement for analgesia.  ?FWB: Category II tracing, now improving after position changes, IVF bolus, and now amnioinfusion.  GBS negative. ?Delivery plan: Hopeful for vaginal delivery, however patient aware of possibility for C-section if persistent non-reassuring fetal status.  ? ? ?Rubie Maid, MD ?[facility] ? ? ? ? ?

## 2022-01-04 MED ORDER — IBUPROFEN 600 MG PO TABS: 600.0000 mg | ORAL_TABLET | Freq: Four times a day (QID) | ORAL | 1 refills | Status: DC

## 2022-01-04 NOTE — Discharge Instructions (Signed)

## 2022-01-04 NOTE — Progress Notes (Signed)
Patient discharged home with family.  Discharge instructions, when to follow up, and prescriptions reviewed with patient.  Patient verbalized understanding. Patient will be escorted out by auxiliary.   

## 2022-01-04 NOTE — Lactation Note (Signed)
This note was copied from a baby's chart. ?Lactation Consultation Note ? ?Patient Name: Tara Baldwin ?Today's Date: 01/04/2022 ?Reason for consult: Follow-up assessment;NICU baby;Term ?Age:30 hours ? ?Maternal Data ?Has patient been taught Hand Expression?: Yes ?Does the patient have breastfeeding experience prior to this delivery?: Yes ?How long did the patient breastfeed?: 3 mths ? ?Feeding ?Mother's Current Feeding Choice: Breast Milk ?First time for mom to have baby at breast, baby has feeding tube in mouth and cpap, baby crying when placed at breast, allowed to calm and just lay at breast with nipple at mouth, did not root at present, colostrum expressed and baby's mouth at nipple, but did not push feeding, mom has sl flat nipples, may need to pump before next session to evert nipple or use nipple shield.     ?LATCH Score ?Latch: Too sleepy or reluctant, no latch achieved, no sucking elicited. (fussy and crying) ? ?Audible Swallowing: None ? ?Type of Nipple: Flat ? ?Comfort (Breast/Nipple): Soft / non-tender ? ?Hold (Positioning): Assistance needed to correctly position infant at breast and maintain latch. ? ?LATCH Score: 4 ? ? ?Lactation Tools Discussed/Used ?Tools: Pump;3F feeding tube / Syringe ?Recommended mom to pump as soon as possible after this attempt, given cotton swab to collect small drops of colostrum, probable d/c today of mom, lactation name updated on white board ?Interventions ?Interventions: Breast feeding basics reviewed;Assisted with latch;Skin to skin;Hand express;Support pillows;Expressed milk;Education ? ?Discharge ?Pump: Personal ?[place] Program: No ? ?Consult Status ?Consult Status: PRN ?Date: 01/04/22 ?Follow-up type: In-patient ? ? ? ?Ferol Luz ?01/04/2022, 12:27 PM ? ? ? ?

## 2022-01-04 NOTE — Discharge Summary (Signed)
? ?  Postpartum Discharge Summary ? ? ?   ?Patient Name: Tara Baldwin ?DOB: [birth year] ?MRN: [id number] ? ?Date of admission: 01/01/2022 ?Delivery date:01/02/2022  ?Delivering provider: Rubie Maid  ?Date of discharge: 01/04/2022 ? ?Admitting diagnosis: Post-dates pregnancy [O48.0] ?Intrauterine pregnancy: [redacted]w[redacted]d    ?Secondary diagnosis:  Principal Problem: ?  Post-dates pregnancy ?Active Problems: ?  Desires VBAC (vaginal birth after cesarean) trial ? ?Additional problems: None    ?Discharge diagnosis: VBAC, Term pregnancy delivered.                                            ?Post partum procedures: None ?Augmentation: Pitocin and OP Foley ?Complications: None ? ?Hospital course: Induction of Labor With Vaginal Delivery   ?31y.o. yo G2P2002 at 432w4das admitted to the hospital 01/01/2022 for induction of labor.  Indication for induction: Postdates.  Patient had an uncomplicated labor course as follows: ?Membrane Rupture Time/Date: 9:15 PM ,01/01/2022   ?Delivery Method:VBAC, Vacuum Assisted  ?Episiotomy: None  ?Lacerations:    ?Details of delivery can be found in separate delivery note.  Patient had a routine postpartum course. Patient is discharged home 01/04/22. ? ?Newborn Data: ?Birth date:01/02/2022  ?Birth time:8:07 AM  ?Gender:Female  ?Living status:Living  ?Apgars:2 ,6  ?Weight:3190 g  ? ?Magnesium Sulfate received: No ?BMZ received: No ?Rhophylac:N/A ?MMR:No ?T-DaP:Given prenatally ?Flu: Given prenatally ?Transfusion:No ? ?Physical exam  ?Vitals:  ? 01/03/22 1536 01/03/22 2031 01/04/22 0007 01/04/22 0820  ?BP: 112/60 110/64 111/62 (!) 102/56  ?Pulse: 68 69 66 75  ?Resp: _0 ?Temp: 97.7 ?F (36.5 ?C) 97.9 ?F (36.6 ?C) 98.7 ?F (37.1 ?C) 98.6 ?F (37 ?C)  ?TempSrc: Oral Oral Oral Oral  ?SpO2:  100% 100% 98%  ?Weight:      ?Height:      ? ?General: alert, cooperative, and no distress ?Lochia: appropriate ?Uterine Fundus: firm ?Incision: N/A ?DVT Evaluation: No evidence of DVT seen on physical  exam. ?Negative Homan's sign. ?No cords or calf tenderness. ?No significant calf/ankle edema. ?Labs: ?Lab Results  ?Component Value Date  ? WBC 17.0 (H) 01/03/2022  ? HGB 9.7 (L) 01/03/2022  ? HCT 28.9 (L) 01/03/2022  ? MCV 90.6 01/03/2022  ? PLT 165 01/03/2022  ? ? ? ?Edinburgh Score: ? ?  01/03/2022  ?  9:30 AM  ?EdFlavia Shipperostnatal Depression Scale Screening Tool  ?I have been able to laugh and see the funny side of things. 0  ?I have looked forward with enjoyment to things. 0  ?I have blamed myself unnecessarily when things went wrong. 0  ?I have been anxious or worried for no good reason. 0  ?I have felt scared or panicky for no good reason. 0  ?Things have been getting on top of me. 0  ?I have been so unhappy that I have had difficulty sleeping. 0  ?I have felt sad or miserable. 0  ?I have been so unhappy that I have been crying. 0  ?The thought of harming myself has occurred to me. 0  ?Edinburgh Postnatal Depression Scale Total 0  ? ? ? ? ?After visit meds:  ?Allergies as of 01/04/2022   ?No Known Allergies ?  ? ?  ?Medication List  ?  ? ?TAKE these medications   ? ?ibuprofen 600 MG tablet ?Commonly known as: ADVIL ?Take 1 tablet (600 mg total) by mouth every  6 (six) hours. ?  ?polyethylene glycol powder 17 GM/SCOOP powder ?Commonly known as: GLYCOLAX/MIRALAX ?Take 17 g by mouth daily as needed for mild constipation or moderate constipation. ?  ?Prenatal Vitamins 28-0.8 MG Tabs ?Take by mouth. ?  ? ?  ? ? ? ?Discharge home in stable condition ?Infant Feeding: Breast (pumping) ?Infant Disposition:NICU ?Discharge instruction: per After Visit Summary and Postpartum booklet. ?Activity: Advance as tolerated. Pelvic rest for 6 weeks.  ?Diet: routine diet ?Anticipated Birth Control: OCPs ?Postpartum Appointment:6 weeks ?Additional Postpartum F/U: Postpartum Depression checkup in 2 weeks ?Future Appointments:No future appointments. ?Follow up Visit: ? Follow-up Information   ? ? Rubie Maid, MD Follow up.    ?Specialties: Obstetrics and Gynecology, Radiology ?Why: 2 week postpartum mood check (televisit) ?6 week postpartum visit ?Contact information: ?[address] ?[address] ?[place] [address] ?[phone] ? ? ?  ?  ? ?  ?  ? ?  ? ? ? ?  ? ?01/04/2022 ?Rubie Maid, MD ? ? ?

## 2022-01-07 ENCOUNTER — Encounter: Payer: Self-pay | Admitting: Obstetrics and Gynecology

## 2022-01-17 ENCOUNTER — Telehealth (INDEPENDENT_AMBULATORY_CARE_PROVIDER_SITE_OTHER): Payer: Managed Care, Other (non HMO) | Admitting: Obstetrics and Gynecology

## 2022-01-17 ENCOUNTER — Encounter: Payer: Self-pay | Admitting: Obstetrics and Gynecology

## 2022-01-17 DIAGNOSIS — Z1332 Encounter for screening for maternal depression: Secondary | ICD-10-CM

## 2022-01-17 DIAGNOSIS — O34219 Maternal care for unspecified type scar from previous cesarean delivery: Secondary | ICD-10-CM

## 2022-01-17 DIAGNOSIS — M6208 Separation of muscle (nontraumatic), other site: Secondary | ICD-10-CM

## 2022-01-17 NOTE — Progress Notes (Signed)
? ?  Virtual Visit via Video Note ? ?I connected with Tara Baldwin on 01/17/22 at  4:00 PM EDT by a video enabled telemedicine application and verified that I am speaking with the correct person using two identifiers. ? ?Location: ?Patient: Home ?Provider: Office ?  ?I discussed the limitations of evaluation and management by telemedicine and the availability of in person appointments. The patient expressed understanding and agreed to proceed. ? ?  ?History of Present Illness: ?  ?Tara Baldwin is a 31 y.o. G3P2002 female who presents for a 2 week televisit for mood check. She is 2 weeks postpartum following a spontaneous vaginal delivery (VBAC). The delivery was at 40.4 gestational weeks. Induction for post-dates pregnancy.  Postpartum course has been well so far. Baby is feeding by breast. Bleeding: light. Postpartum depression screening: negative.  EDPS score is negative.  ?  ? Patient has questions about possible referral for diastasis recti.  Has seen physical therapy after first pregnancy and noted that this helped. Has concerns for how long she will be able to be seen as she and her family may be relocating to [place] in the next few months.  ? ?The following portions of the patient's history were reviewed and updated as appropriate: allergies, current medications, past family history, past medical history, past social history, past surgical history, and problem list. ?  ?Observations/Objective: ?  ?Height 5\' 6"  (1.676 m), unknown if currently breastfeeding. ?Gen App: NAD ?Psych: normal speech, affect. Good mood.  ?  ? ? ?  01/17/2022  ?  4:13 PM 01/03/2022  ?  9:30 AM  ?Flavia Shipper Postnatal Depression Scale Screening Tool  ?I have been able to laugh and see the funny side of things. 0 0  ?I have looked forward with enjoyment to things. 0 0  ?I have blamed myself unnecessarily when things went wrong. 0 0  ?I have been anxious or worried for no good reason. 0 0  ?I have felt scared or panicky for no good  reason. 0 0  ?Things have been getting on top of me. 0 0  ?I have been so unhappy that I have had difficulty sleeping. 0 0  ?I have felt sad or miserable. 0 0  ?I have been so unhappy that I have been crying. 0 0  ?The thought of harming myself has occurred to me. 0 0  ?Edinburgh Postnatal Depression Scale Total 0 0  ?  ? ?  ?Assessment and Plan: ?  ?1. Encounter for screening for maternal depression ?- Screening negative today. Will rescreen at 6 week postpartum visit. Overall doing well.  ?  ?2. Postpartum state ?- Overall doing well. Continue routine postpartum home care.  ?  ?3. Lactating mother ?- Doing well with breastfeeding, no concerns.  ?  ?4. Diastasis recti ? Ambulatory referral to PT placed.  ? ? ?Follow Up Instructions: ?  ?  ?I discussed the assessment and treatment plan with the patient. The patient was provided an opportunity to ask questions and all were answered. The patient agreed with the plan and demonstrated an understanding of the instructions. ?  ?The patient was advised to call back or seek an in-person evaluation if the symptoms worsen or if the condition fails to improve as anticipated. ?  ?I provided 9 minutes of non-face-to-face time during this encounter. ?  ?  ?Rubie Maid, MD ?[facility] ? ?

## 2022-02-12 ENCOUNTER — Encounter: Payer: Managed Care, Other (non HMO) | Admitting: Obstetrics and Gynecology

## 2022-02-27 NOTE — Progress Notes (Signed)
OBSTETRICS POSTPARTUM CLINIC PROGRESS NOTE  Subjective:     Tara Baldwin is a 31 y.o. G39P2002 female who presents for a postpartum visit. She is 6 weeks postpartum following a VBAC. I have fully reviewed the prenatal and intrapartum course. The delivery was at 40.4 gestational weeks.  Anesthesia: epidural. Postpartum course has been complicated. Baby's course has been doing well. Baby is feeding by  breastfeeding with bottle . Bleeding: patient has not resumed menses, with Patient's last menstrual period was 02/14/2022 (exact date).. Bowel function is normal. Bladder function is normal. Patient is not sexually active. Contraception method desired is condoms. Intercourse was very uncomfortable. Postpartum depression screening: negative.  EDPS score is 7. She has been going through a lot of stress. With family, work life balance. She has been really forgetful lately. She has been crying a lot sporadically over the past two months. She doesn't feel like she is depressed. No suicidal ideation. She is very tired from working 13 hour shifts several days per week, and taking care of a newborn she has not been getting the support from her family like she thought she would. Her husband has been working in [place] some days and she has to be home with baby. He does take the other kids with him, but it is still difficult for her.  Patient also noting some pain with intercourse. States that it feels like sharp pains in her vagina with deep penetration.   The following portions of the patient's history were reviewed and updated as appropriate: allergies, current medications, past family history, past medical history, past social history, past surgical history, and problem list.  Review of Systems Pertinent items noted in HPI and remainder of comprehensive ROS otherwise negative.   Objective:    BP 128/76   Pulse 65   Resp 16   Ht 5\' 6"  (1.676 m)   Wt 176 lb 14.4 oz (80.2 kg)   LMP 02/14/2022 (Exact  Date)   BMI 28.55 kg/m   General:  alert and tearful during today's exam.    Breasts:  inspection negative, no nipple discharge or bleeding, no masses or nodularity palpable  Lungs: clear to auscultation bilaterally  Heart:  regular rate and rhythm, S1, S2 normal, no murmur, click, rub or gallop  Abdomen: soft, non-tender; bowel sounds normal; no masses,  no organomegaly.  Moderate diastasis recti present.    Vulva:  normal  Vagina: normal vagina, scant thin discharge,no erythema. Moderate discomfort with speculum exam.   Cervix:  no cervical motion tenderness and no lesions  Corpus: normal size, contour, position, consistency, mobility, non-tender  Adnexa:  normal adnexa and no mass, fullness, tenderness  Rectal Exam: Not performed.         Labs:  Lab Results  Component Value Date   HGB 9.7 (L) 01/03/2022     Assessment:   1. Postpartum care following vaginal delivery   2. Postpartum anemia   3. Stress   4. Dyspareunia in female   5. At risk for depressed mood during postpartum period   6. Diastasis recti      Plan:   1. Contraception: condoms.  2.  Dyspareunia - Discussed use of coconut oil daily in the vaginal area, also can use lubricants for additional moisture (given samples of UberLube).   3. Will check Hgb for h/o postpartum anemia of less than 10.  4. Lengthy conversation had regarding patient's stress and tearfulness (at risk for postpartum depression). Discussed reducing hours at work due to  mental health reasons, can complete new FMLA. Will also refer for counseling. Discussed use of OTC supplements such as SAM-E for mood. Advised on methods of relieving stress at home.  5. Diastasis recti - discussed support garments, also referral previously placed for physical therapy.  6. Follow up in: 3 months for annual exam, or sooner as needed.   Hildred Laser, MD [facility]

## 2022-03-05 ENCOUNTER — Other Ambulatory Visit: Payer: Self-pay | Admitting: Obstetrics and Gynecology

## 2022-03-05 ENCOUNTER — Encounter: Payer: Self-pay | Admitting: Obstetrics and Gynecology

## 2022-03-05 ENCOUNTER — Ambulatory Visit (INDEPENDENT_AMBULATORY_CARE_PROVIDER_SITE_OTHER): Payer: Managed Care, Other (non HMO) | Admitting: Obstetrics and Gynecology

## 2022-03-05 DIAGNOSIS — O9081 Anemia of the puerperium: Secondary | ICD-10-CM

## 2022-03-05 DIAGNOSIS — M6208 Separation of muscle (nontraumatic), other site: Secondary | ICD-10-CM

## 2022-03-05 DIAGNOSIS — F439 Reaction to severe stress, unspecified: Secondary | ICD-10-CM

## 2022-03-05 DIAGNOSIS — N941 Unspecified dyspareunia: Secondary | ICD-10-CM

## 2022-03-05 DIAGNOSIS — Z9189 Other specified personal risk factors, not elsewhere classified: Secondary | ICD-10-CM

## 2022-03-06 LAB — HEMOGLOBIN AND HEMATOCRIT, BLOOD
Hematocrit: 36.7 % (ref 34.0–46.6)
Hemoglobin: 12.4 g/dL (ref 11.1–15.9)

## 2022-03-19 ENCOUNTER — Encounter: Payer: Self-pay | Admitting: Obstetrics and Gynecology

## 2022-03-31 IMAGING — CT CT HEAD W/O CM
4 series · 15 of 47 positions shown, 17 images · non-contrast
Comparison: None.
COMPARISON: None.

Addendum:
CLINICAL DATA: Injury head pain

EXAM:
CT HEAD WITHOUT CONTRAST
TECHNIQUE: Contiguous axial images were obtained from the base of the skull
through the vertex without intravenous contrast.

[Series 2: head wo · axial · 0.43mm/px · z∈[+1386,+1491]mm · 7 of 29 slices shown, 9 images]
[im 4/29  brain]
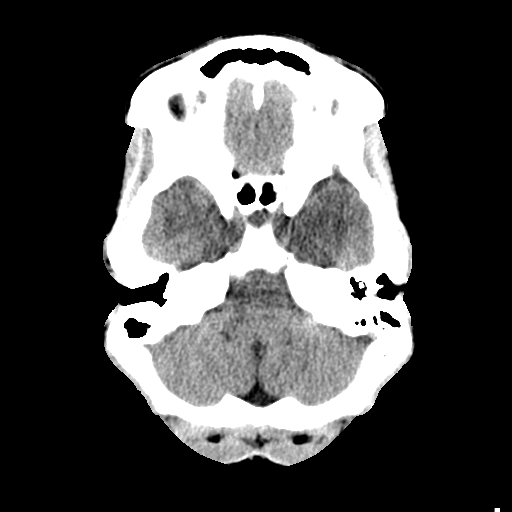
[im 4/29  bone]
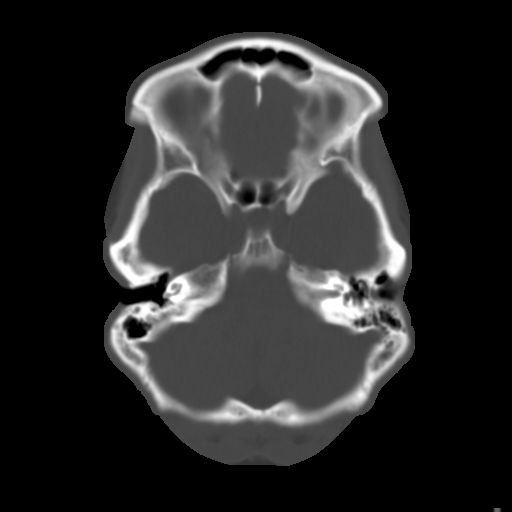
[im 8/29  brain]
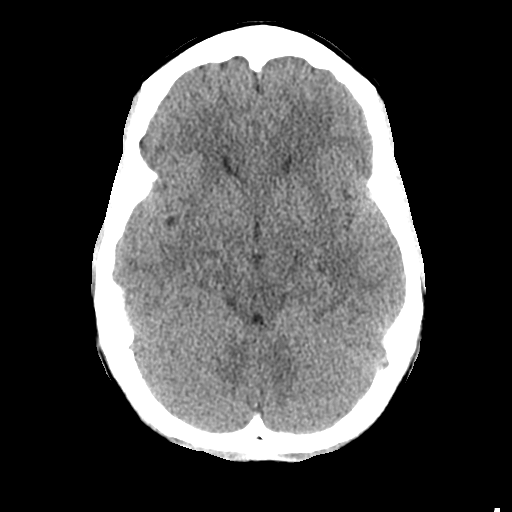
[im 11/29  brain]
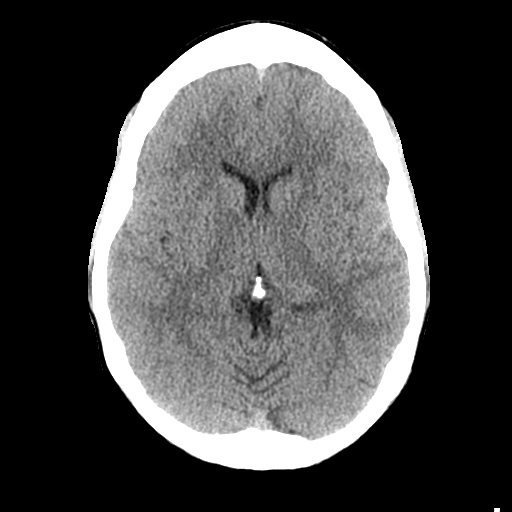
[im 15/29  brain]
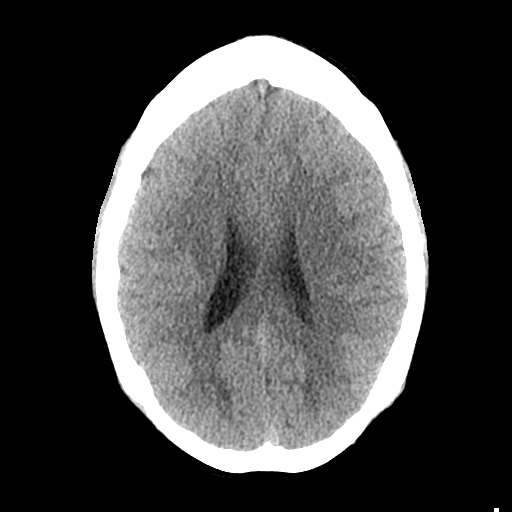
[im 18/29  brain]
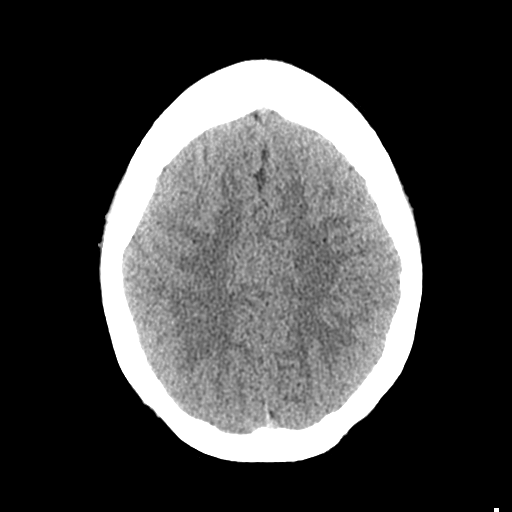
[im 18/29  bone]
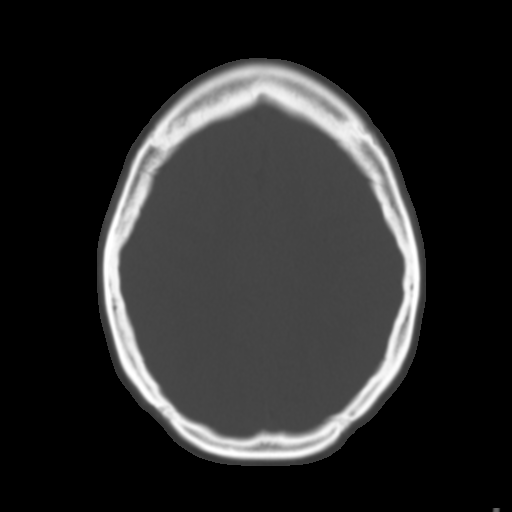
[im 22/29  brain]
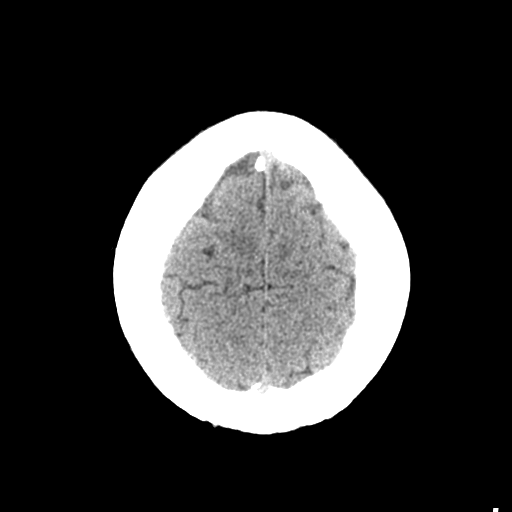
[im 25/29  brain]
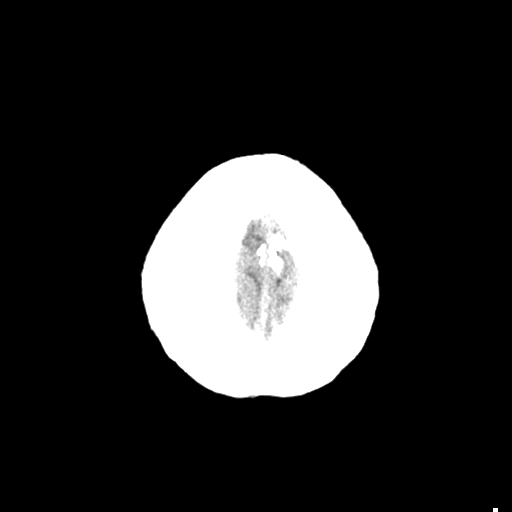

[Series 3: head bone · axial · 0.43mm/px · z∈[+1385,+1399]mm · 2 of 73 slices shown]
[im 8/73  bone]
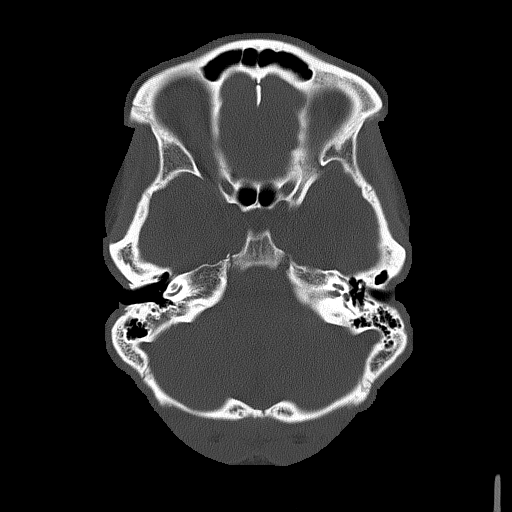
[im 15/73  bone]
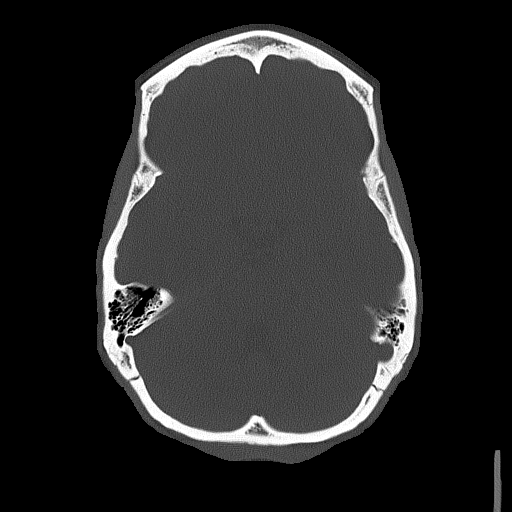

[Series 4: coronal soft tissue · coronal · 0.32mm/px · 3 of 68 slices shown]
[im 23/68  brain]
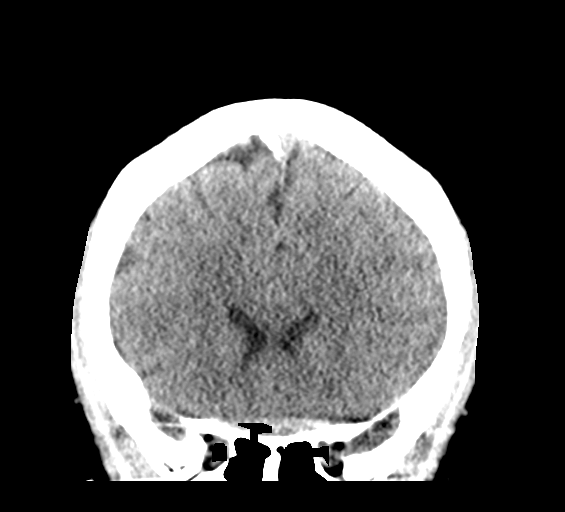
[im 30/68  brain]
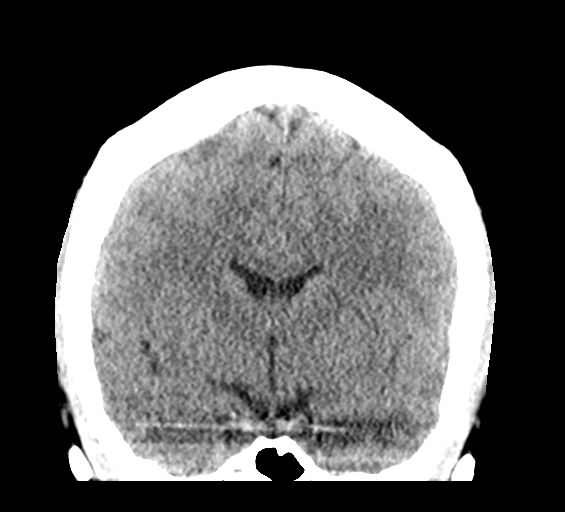
[im 38/68  brain]
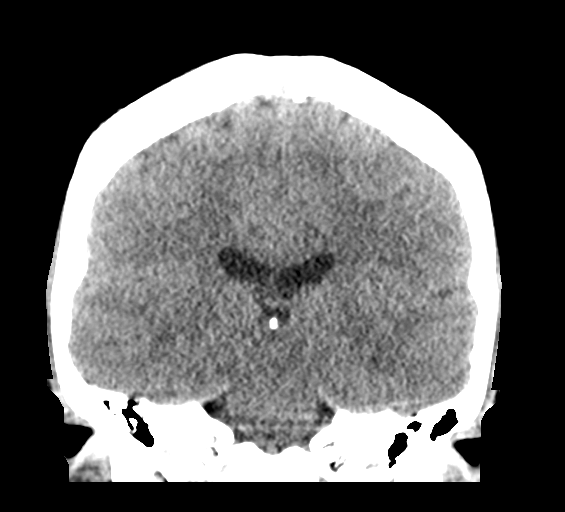

[Series 5: sagittal soft tissue · sagittal · 0.31mm/px · 3 of 54 slices shown]
[im 18/54  brain]
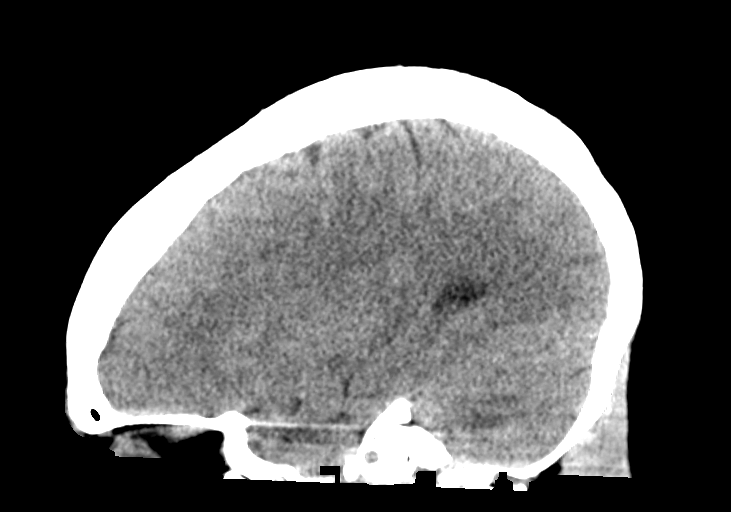
[im 27/54  brain]
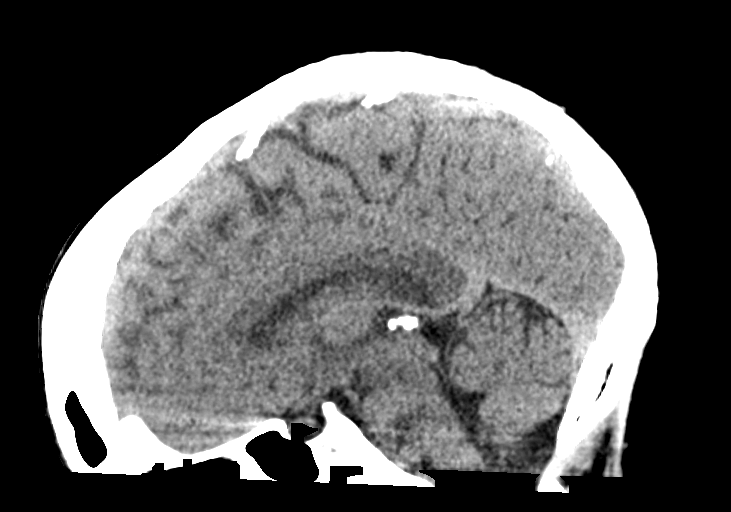
[im 36/54  brain]
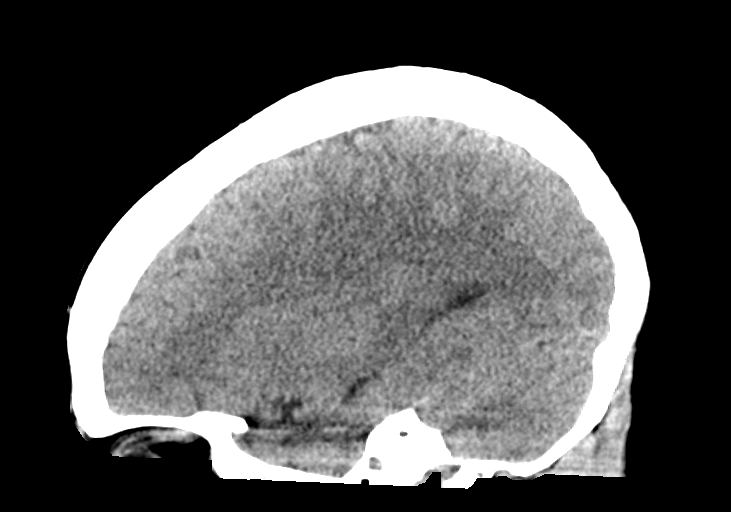

[15 of 47 positions shown; findings below may reference images not displayed]

FINDINGS: Brain: No evidence of acute territorial infarction, hemorrhage,
hydrocephalus,extra-axial collection or mass lesion/mass effect.
Normal gray-white differentiation. Ventricles are normal in size and
contour.

Vascular: No hyperdense vessel or unexpected calcification.

Skull: The skull is intact. No fracture or focal lesion identified.

Sinuses/Orbits: The visualized paranasal sinuses and mastoid air
cells are clear. The orbits and globes intact.

Other: None
IMPRESSION: No acute intracranial abnormality.  In

ADDENDUM:
No acute intracranial abnormality.

*** End of Addendum ***
FINDINGS: Brain: No evidence of acute territorial infarction, hemorrhage,
hydrocephalus,extra-axial collection or mass lesion/mass effect.
Normal gray-white differentiation. Ventricles are normal in size and
contour.

Vascular: No hyperdense vessel or unexpected calcification.

Skull: The skull is intact. No fracture or focal lesion identified.

Sinuses/Orbits: The visualized paranasal sinuses and mastoid air
cells are clear. The orbits and globes intact.

Other: None
IMPRESSION: No acute intracranial abnormality.  In

## 2022-04-16 ENCOUNTER — Ambulatory Visit: Payer: Managed Care, Other (non HMO) | Attending: Family Medicine

## 2022-04-16 DIAGNOSIS — M6208 Separation of muscle (nontraumatic), other site: Secondary | ICD-10-CM | POA: Diagnosis present

## 2022-04-16 DIAGNOSIS — R279 Unspecified lack of coordination: Secondary | ICD-10-CM | POA: Insufficient documentation

## 2022-04-16 DIAGNOSIS — M6281 Muscle weakness (generalized): Secondary | ICD-10-CM | POA: Insufficient documentation

## 2022-04-16 DIAGNOSIS — R293 Abnormal posture: Secondary | ICD-10-CM | POA: Diagnosis present

## 2022-04-16 NOTE — Therapy (Signed)
OUTPATIENT PHYSICAL THERAPY FEMALE PELVIC EVALUATION   Patient Name: Tara Baldwin MRN: [id number] DOB:06/04/1991, 31 y.o., female Today's Date: 04/16/2022   PT End of Session - 04/16/22 0846     Visit Number 1    Number of Visits 12    Date for PT Re-Evaluation 07/09/22    Authorization Type IE: 04/16/22    PT Start Time 0845    PT Stop Time 0925    PT Time Calculation (min) 40 min    Activity Tolerance Patient tolerated treatment well             Past Medical History:  Diagnosis Date   Diastasis recti 08/20/2018   Headache    STD (female) 05/16/2017   chlmydia   Past Surgical History:  Procedure Laterality Date   CESAREAN SECTION N/A 12/19/2017   Procedure: CESAREAN SECTION;  Surgeon: Herold Harms, MD;  Location: [facility] ORS;  Service: Obstetrics;  Laterality: N/A;   Patient Active Problem List   Diagnosis Date Noted   Diastasis recti 08/20/2018   Breast lump on right side at 11 o'clock position 10/09/2017    PCP: Saralyn Pilar, MD  REFERRING PROVIDER: Hildred Laser, MD   REFERRING DIAG:  M62.08 (ICD-10-CM) - Diastasis recti   THERAPY DIAG:  Muscle weakness (generalized)  Unspecified lack of coordination  Separation of muscle (nontraumatic), other site  Abnormal posture  Rationale for Evaluation and Treatment: Rehabilitation  ONSET DATE: After 2nd pregnancy  RED FLAGS: N/A  Have you had any night sweats? Unexplained weight loss? Saddle anesthesia? Unexplained changes in bowel or bladder habits?   SUBJECTIVE: Patient confirms identification and approves PT to assess pelvic floor and treatment Yes                                                                                                                                                                                           PRECAUTIONS: None  WEIGHT BEARING RESTRICTIONS: No  FALLS:  Has patient fallen in last 6 months? No  OCCUPATION/SOCIAL ACTIVITIES: [employer] up on her  feet, traveling  PLOF: Independent   CHIEF CONCERN: Pt has talked to Dr. Valentino Saxon about postpartum anxiety. Dr recommended SAMe but Pt said it was expensive and was referred to a psychiatrist but is hesitant about taking medication. Pt has been able to take more time off from work and spend time at home with family. Pt feels this has helped and will look into talk therapy if things get worse. Pt is breastfeeding for the first time but is pumping, as baby is not latching on. Pt has been to PFPT before due to having a DRA with  first born child. Pt now returns for concerns of returning diastasis. 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 Hx of low back pain but none currently. Pt does have mid-back pain when pumping and feels she has position her body at different angles to completely pump breast milk out.    PAIN:  Are you having pain? No    LIVING ENVIRONMENT: Lives with: lives with their family Lives in: House/apartment, Apartment     PATIENT GOALS: Pt would like to close diastasis recti and be in control of her body and core again.     UROLOGICAL HISTORY Pt has no concerns    GASTROINTESTINAL HISTORY Pt has no concerns     SEXUAL HISTORY/FUNCTION  Pain with intercourse: Initial Penetration Ability to have vaginal penetration:  Yes: have to stop due to pain , burning sensation, 9/10  Able to achieve orgasm?: Yes   OBSTETRICAL HISTORY Vaginal deliveries: G2P2 Tearing: Yes: minimal no stitches  C-section deliveries: 1st delivery  Currently pregnant: No  GYNECOLOGICAL HISTORY Hysterectomy: no Pelvic Organ Prolapse: None Pain with exam: yes, burning  Heaviness/pressure: yes no   OBJECTIVE:   DIAGNOSTIC TESTING/IMPRESSIONS:    COGNITION: Overall cognitive status: Within functional limits for tasks assessed     POSTURE:  Lumbar lordosis: increased in sitting and standing   Iliac crest height: WNL Pelvic obliquity:  WNL   GAIT:  Deferred 2/2 time constraints  Distance walked:  Comments:   Trendelenburg:   SENSATION: Deferred 2/2 time constraints  Light touch: , L2-S2 dermatomes  Proprioception:    RANGE OF MOTION:  Deferred the rest 2/2 time constraints   (Norm range in degrees)  LEFT 04/16/22 RIGHT 04/16/22  Lumbar forward flexion (65):  WNL    Lumbar extension (30): WNL    Lumbar lateral flexion (25):  WNL WNL  Thoracic and Lumbar rotation (30 degrees):    WNL WNL  Hip Flexion (0-125):      Hip IR (0-45):     Hip ER (0-45):     Hip Adduction:      Hip Abduction (0-40):     Hip extension (0-15):     (*= pain, Blank rows = not tested)   STRENGTH: MMT  Deferred 2/2 time constraints   RLE  LLE   Hip Flexion    Hip Extension    Hip Abduction     Hip Adduction     Hip ER     Hip IR     Knee Extension    Knee Flexion    Dorsiflexion     Plantarflexion (seated)    (*= pain, Blank rows = not tested)   SPECIAL TESTS: Deferred 2/2 time constraints  Centralization and Peripheralization (SN 92, -LR 0.12):  Slump (SN 83, -LR 0.32): R:  L:  SLR (SN 92, -LR 0.29): R:  L:   Lumbar quadrant (SN 70): R:  L:  FABER (SN 81): R:  L:  FADIR (SN 94): R:  L:  Hip scour (SN 50): R:  L:  Thigh Thrust (SN 88, -LR 0.18) : R:  L:  Distraction (UX32): R:  L:  Compression (SN/SP 69): R:  L:  Stork/March (SP 93): R:  L:    PALPATION: Deferred the rest 2/2 time constraints  Abdominal:  Diastasis: 3 finger above umbilicus with DIP depth, 3 fingers at and below umbilicus with DIP depth  Scar mobility:  Rib flare:   EXTERNAL PELVIC EXAM: Patient educated on  the purpose of the pelvic exam and articulated understanding; patient consented to the exam verbally. Deferred 2/2 time constraints  Palpation: Breath coordination: present/absent/inconsistent Voluntary Contraction: present/absent Relaxation: full/delayed/non-relaxing Perineal movement with sustained IAP increase ("bear down"): descent/no  change/elevation/excessive descent Perineal movement with rapid IAP increase ("cough"): elevation/no change/descent Pubic symphysis: (0= no contraction, 1= flicker, 2= weak squeeze, 3= fair squeeze with lift, 4= good squeeze and lift against resistance, 5= strong squeeze against strong resistance)   INTERNAL PELVIC EXAM: Patient educated on the purpose of the pelvic exam and articulated understanding; patient consented to the exam verbally. Deferred 2/2 to time constraints Introitus Appears:  Skin integrity:  Scar mobility: Strength (PERF):  Symmetry: Palpation: Prolapse: (0= no contraction, 1= flicker, 2= weak squeeze, 3= fair squeeze with lift, 4= good squeeze and lift against resistance, 5= strong squeeze against strong resistance)   TODAY'S TREATMENT:   Pre-treatment assessment: lumbar/thoracic ROM, abdominal palpation for DRA  Neuromuscular Re-education Supine hooklying diaphragmatic breathing with VCs and TCs for downregulation of the nervous system and improved IAP management  Supine pelvic tilts in order to find the neutral spine for improved postural stability and mobility   Patient Education:  Patient educated on what to expect during course of physical therapy, POC, and provided with a discussion on mental health resources (talk therapy, physical activity vs psychiatry) and what signs to be aware of such as, but limited to, disinterest in social activities, decreased sleep (more than usual as Pt is breastfeeding), constant emotional distress, inability to participate in ADLs or taking care of young children. Patient verbalized understanding. Patient will benefit from further education in order to maximize compliance and understanding for long-term therapeutic gains.    Patient Surveys:  FOTO PFDI Pain- 13    ASSESSMENT:  Clinical Impression: Patient is a 31 y.o. who was seen today for physical therapy evaluation and treatment for a chief concern of diastasis recti.  Today's evaluation suggest deficits in IAP management, pain, posture, PFM coordination, PFM extensibility, posture, and scar mobility as evidenced by increased lumbar lordosis in sitting and standing, increased mid-thoracic pain when breastfeeding, pain with penetrative sex 9/10 (NPRS) upon initial penetration and pain inhibits continuation of activity, pain (9/10) with gynecological exams described as a burning sensation, presence of DRA after 1st pregnancy, and delivery via c-section with 1st child. Upon brief physical examination, Pt demonstrates DRA of 3 fingers width/DIP depth above, at, and below umbilicus with observed doming upon neck flexion. Patient's responses on FOTO PFDI Pain (13) indicates minimal limitation/disability/distress. Patient's progress may be limited due to previous DRA and demanding nature of postpartum childcare; however, patient's motivation is advantageous. Pt with basic understanding of PFM function in bowel/bladder habits, the deep core, sexual function, breathing mechanics, and posture. Patient will benefit from skilled therapeutic intervention to address deficits in IAP management, pain, posture, PFM coordination,  PFM extensibility, posture, and scar mobility  in order to increase PLOF and improve overall QOL.    Objective Impairments: decreased activity tolerance, decreased coordination, decreased endurance, decreased strength, increased fascial restrictions, improper body mechanics, postural dysfunction, and pain.   Activity Limitations: carrying, lifting, bending, squatting, locomotion level, and caring for others  Personal Factors: Behavior pattern, Past/current experiences, and Time since onset of injury/illness/exacerbation are also affecting patient's functional outcome.   Rehab Potential: Good  Clinical Decision Making: Evolving/moderate complexity  Evaluation Complexity: Moderate   GOALS: Goals reviewed with patient? Yes  SHORT TERM GOALS: Target date:  05/28/2022  Patient will demonstrate independence with HEP  in order to maximize therapeutic gains and improve carryover from physical therapy sessions to ADLs in the home and community. Baseline: supine diaphragmatic breathing Goal status: INITIAL    LONG TERM GOALS: Target date: 07/09/2022   Patient will be able to demonstrate improved abdominal strengthening with closure of the diastasis recti to 2 fingers or less in order to return to activities such as, but not limited to: physical activity and caring for young children without limitation or distress.  Baseline: 3 finger width/DIP depth above, at, and below.  Goal status: INITIAL  2.  Pt will be able to coordinate PFM relaxation and contraction and verbalize pain modulation/mindfulness techniques in order to reduce worst pain by at least 2 points and participate in activities such as, but not limited to: penetrative sex and gynecological yearly exams.  Baseline: 9/10 with burning sensation, unable to continue with penetration due to pain  Goal status: INITIAL  3.  Patient will be able to demonstrate improved postural alignment in standing and sitting and overall improved muscular strength in order to reduce bodily compensations from prior pregnancies and allow for a complete resolution of pelvic floor chief concern: DRA.  Baseline: increased lordosis sitting, standing, and supine Goal status: INITIAL  4.  Patient will demonstrate independent and coordinated diaphragmatic breathing in supine with a 1:2 breathing pattern for improved down-regulation of the nervous system and improved management of intra-abdominal pressures in order to increase function at home and in the community. Baseline: will assess next visit  Goal status: INITIAL   PLAN: PT Frequency: 1x/week  PT Duration: 12 weeks  Planned Interventions: Therapeutic exercises, Therapeutic activity, Neuromuscular re-education, Balance training, Gait training, Patient/Family  education, Joint mobilization, Spinal mobilization, Cryotherapy, Moist heat, scar mobilization, Taping, and Manual therapy  Plan For Next Session: finish phys assess, log rolling technique    Hari Casaus, PT, DPT  04/16/2022, 2:31 PM

## 2022-04-16 NOTE — Therapy (Signed)
OUTPATIENT PHYSICAL THERAPY FEMALE PELVIC EVALUATION   Patient Name: Tara Baldwin MRN: [id number] DOB:06/04/1991, 31 y.o., female Today's Date: 04/16/2022   PT End of Session - 04/16/22 0846     Visit Number 1    Number of Visits 12    Date for PT Re-Evaluation 07/09/22    Authorization Type IE: 04/16/22    PT Start Time 0845    PT Stop Time 0925    PT Time Calculation (min) 40 min    Activity Tolerance Patient tolerated treatment well             Past Medical History:  Diagnosis Date   Diastasis recti 08/20/2018   Headache    STD (female) 05/16/2017   chlmydia   Past Surgical History:  Procedure Laterality Date   CESAREAN SECTION N/A 12/19/2017   Procedure: CESAREAN SECTION;  Surgeon: Herold Harms, MD;  Location: [facility] ORS;  Service: Obstetrics;  Laterality: N/A;   Patient Active Problem List   Diagnosis Date Noted   Diastasis recti 08/20/2018   Breast lump on right side at 11 o'clock position 10/09/2017    PCP: Saralyn Pilar, MD  REFERRING PROVIDER: Hildred Laser, MD   REFERRING DIAG:  M62.08 (ICD-10-CM) - Diastasis recti   THERAPY DIAG:  Muscle weakness (generalized)  Unspecified lack of coordination  Separation of muscle (nontraumatic), other site  Abnormal posture  Rationale for Evaluation and Treatment: Rehabilitation  ONSET DATE: After 2nd pregnancy  RED FLAGS: N/A  Have you had any night sweats? Unexplained weight loss? Saddle anesthesia? Unexplained changes in bowel or bladder habits?   SUBJECTIVE: Patient confirms identification and approves PT to assess pelvic floor and treatment Yes                                                                                                                                                                                           PRECAUTIONS: None  WEIGHT BEARING RESTRICTIONS: No  FALLS:  Has patient fallen in last 6 months? No  OCCUPATION/SOCIAL ACTIVITIES: [employer] up on her  feet, traveling  PLOF: Independent   CHIEF CONCERN: Pt has talked to Dr. Valentino Saxon about postpartum anxiety. Dr recommended SAMe but Pt said it was expensive and was referred to a psychiatrist but is hesitant about taking medication. Pt has been able to take more time off from work and spend time at home with family. Pt feels this has helped and will look into talk therapy if things get worse. Pt is breastfeeding for the first time but is pumping, as baby is not latching on. Pt has been to PFPT before due to having a DRA with  first born child. Pt now returns for concerns of returning diastasis. 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 Hx of low back pain but none currently. Pt does have mid-back pain when pumping and feels she has position her body at different angles to completely pump breast milk out.    PAIN:  Are you having pain? No    LIVING ENVIRONMENT: Lives with: lives with their family Lives in: House/apartment, Apartment     PATIENT GOALS: Pt would like to close diastasis recti and be in control of her body and core again.     UROLOGICAL HISTORY Pt has no concerns    GASTROINTESTINAL HISTORY Pt has no concerns     SEXUAL HISTORY/FUNCTION  Pain with intercourse: Initial Penetration Ability to have vaginal penetration:  Yes: have to stop due to pain , burning sensation, 9/10  Able to achieve orgasm?: Yes   OBSTETRICAL HISTORY Vaginal deliveries: G2P2 Tearing: Yes: minimal no stitches  C-section deliveries: 1st delivery  Currently pregnant: No  GYNECOLOGICAL HISTORY Hysterectomy: no Pelvic Organ Prolapse: None Pain with exam: yes, burning  Heaviness/pressure: yes no   OBJECTIVE:   DIAGNOSTIC TESTING/IMPRESSIONS:    COGNITION: Overall cognitive status: Within functional limits for tasks assessed     POSTURE:  Lumbar lordosis: increased in sitting and standing   Iliac crest height: WNL Pelvic obliquity:  WNL   GAIT:  Deferred 2/2 time constraints  Distance walked:  Comments:   Trendelenburg:   SENSATION: Deferred 2/2 time constraints  Light touch: , L2-S2 dermatomes  Proprioception:    RANGE OF MOTION:  Deferred the rest 2/2 time constraints   (Norm range in degrees)  LEFT 04/16/22 RIGHT 04/16/22  Lumbar forward flexion (65):  WNL    Lumbar extension (30): WNL    Lumbar lateral flexion (25):  WNL WNL  Thoracic and Lumbar rotation (30 degrees):    WNL WNL  Hip Flexion (0-125):      Hip IR (0-45):     Hip ER (0-45):     Hip Adduction:      Hip Abduction (0-40):     Hip extension (0-15):     (*= pain, Blank rows = not tested)   STRENGTH: MMT  Deferred 2/2 time constraints   RLE  LLE   Hip Flexion    Hip Extension    Hip Abduction     Hip Adduction     Hip ER     Hip IR     Knee Extension    Knee Flexion    Dorsiflexion     Plantarflexion (seated)    (*= pain, Blank rows = not tested)   SPECIAL TESTS: Deferred 2/2 time constraints  Centralization and Peripheralization (SN 92, -LR 0.12):  Slump (SN 83, -LR 0.32): R:  L:  SLR (SN 92, -LR 0.29): R:  L:   Lumbar quadrant (SN 70): R:  L:  FABER (SN 81): R:  L:  FADIR (SN 94): R:  L:  Hip scour (SN 50): R:  L:  Thigh Thrust (SN 88, -LR 0.18) : R:  L:  Distraction (UX32): R:  L:  Compression (SN/SP 69): R:  L:  Stork/March (SP 93): R:  L:    PALPATION: Deferred the rest 2/2 time constraints  Abdominal:  Diastasis: 3 finger above umbilicus with DIP depth, 3 fingers at and below umbilicus with DIP depth  Scar mobility:  Rib flare:   EXTERNAL PELVIC EXAM: Patient educated on  the purpose of the pelvic exam and articulated understanding; patient consented to the exam verbally. Deferred 2/2 time constraints  Palpation: Breath coordination: present/absent/inconsistent Voluntary Contraction: present/absent Relaxation: full/delayed/non-relaxing Perineal movement with sustained IAP increase ("bear down"): descent/no  change/elevation/excessive descent Perineal movement with rapid IAP increase ("cough"): elevation/no change/descent Pubic symphysis: (0= no contraction, 1= flicker, 2= weak squeeze, 3= fair squeeze with lift, 4= good squeeze and lift against resistance, 5= strong squeeze against strong resistance)   INTERNAL PELVIC EXAM: Patient educated on the purpose of the pelvic exam and articulated understanding; patient consented to the exam verbally. Deferred 2/2 to time constraints Introitus Appears:  Skin integrity:  Scar mobility: Strength (PERF):  Symmetry: Palpation: Prolapse: (0= no contraction, 1= flicker, 2= weak squeeze, 3= fair squeeze with lift, 4= good squeeze and lift against resistance, 5= strong squeeze against strong resistance)   TODAY'S TREATMENT:   Pre-treatment assessment: lumbar/thoracic ROM, abdominal palpation for DRA  Neuromuscular Re-education Supine hooklying diaphragmatic breathing with VCs and TCs for downregulation of the nervous system and improved IAP management  Supine pelvic tilts in order to find the neutral spine for improved postural stability and mobility   Patient Education:  Patient educated on what to expect during course of physical therapy, POC, and provided with a discussion on mental health resources (talk therapy, physical activity vs psychiatry) and what signs to be aware of such as, but limited to, disinterest in social activities, decreased sleep (more than usual as Pt is breastfeeding), constant emotional distress, inability to participate in ADLs or taking care of young children. Patient verbalized understanding. Patient will benefit from further education in order to maximize compliance and understanding for long-term therapeutic gains.    Patient Surveys:  FOTO PFDI Pain- 13    ASSESSMENT:  Clinical Impression: Patient is a 31 y.o. who was seen today for physical therapy evaluation and treatment for a chief concern of diastasis recti.  Today's evaluation suggest deficits in IAP management, pain, posture, PFM coordination, PFM extensibility, posture, and scar mobility as evidenced by increased lumbar lordosis in sitting and standing, increased mid-thoracic pain when breastfeeding, pain with penetrative sex 9/10 (NPRS) upon initial penetration and pain inhibits continuation of activity, pain (9/10) with gynecological exams described as a burning sensation, presence of DRA after 1st pregnancy, and delivery via c-section with 1st child. Upon brief physical examination, Pt demonstrates DRA of 3 fingers width/DIP depth above, at, and below umbilicus with observed doming upon neck flexion. Patient's responses on FOTO PFDI Pain (13) indicates minimal limitation/disability/distress. Patient's progress may be limited due to previous DRA and demanding nature of postpartum childcare; however, patient's motivation is advantageous. Pt with basic understanding of PFM function in bowel/bladder habits, the deep core, sexual function, breathing mechanics, and posture. Patient will benefit from skilled therapeutic intervention to address deficits in IAP management, pain, posture, PFM coordination,  PFM extensibility, posture, and scar mobility  in order to increase PLOF and improve overall QOL.    Objective Impairments: decreased activity tolerance, decreased coordination, decreased endurance, decreased strength, increased fascial restrictions, improper body mechanics, postural dysfunction, and pain.   Activity Limitations: carrying, lifting, bending, squatting, locomotion level, and caring for others  Personal Factors: Behavior pattern, Past/current experiences, and Time since onset of injury/illness/exacerbation are also affecting patient's functional outcome.   Rehab Potential: Good  Clinical Decision Making: Evolving/moderate complexity  Evaluation Complexity: Moderate   GOALS: Goals reviewed with patient? Yes  SHORT TERM GOALS: Target date:  05/28/2022  Patient will demonstrate independence with HEP  in order to maximize therapeutic gains and improve carryover from physical therapy sessions to ADLs in the home and community. Baseline: supine diaphragmatic breathing Goal status: INITIAL    LONG TERM GOALS: Target date: 07/09/2022   Patient will be able to demonstrate improved abdominal strengthening with closure of the diastasis recti to 2 fingers or less in order to return to activities such as, but not limited to: physical activity and caring for young children without limitation or distress.  Baseline: 3 finger width/DIP depth above, at, and below.  Goal status: INITIAL  2.  Pt will be able to coordinate PFM relaxation and contraction and verbalize pain modulation/mindfulness techniques in order to reduce worst pain by at least 2 points and participate in activities such as, but not limited to: penetrative sex and gynecological yearly exams.  Baseline: 9/10 with burning sensation, unable to continue with penetration due to pain  Goal status: INITIAL  3.  Patient will be able to demonstrate improved postural alignment in standing and sitting and overall improved muscular strength in order to reduce bodily compensations from prior pregnancies and allow for a complete resolution of pelvic floor chief concern: DRA.  Baseline: increased lordosis sitting, standing, and supine Goal status: INITIAL  4.  Patient will demonstrate independent and coordinated diaphragmatic breathing in supine with a 1:2 breathing pattern for improved down-regulation of the nervous system and improved management of intra-abdominal pressures in order to increase function at home and in the community. Baseline: will assess next visit  Goal status: INITIAL   PLAN: PT Frequency: 1x/week  PT Duration: 12 weeks  Planned Interventions: Therapeutic exercises, Therapeutic activity, Neuromuscular re-education, Balance training, Gait training, Patient/Family  education, Joint mobilization, Spinal mobilization, Cryotherapy, Moist heat, scar mobilization, Taping, and Manual therapy  Plan For Next Session: finish phys assess, log rolling technique    Lilou Kneip, PT, DPT  04/16/2022, 2:06 PM

## 2022-04-23 ENCOUNTER — Ambulatory Visit: Payer: Managed Care, Other (non HMO)

## 2022-04-23 DIAGNOSIS — M6208 Separation of muscle (nontraumatic), other site: Secondary | ICD-10-CM

## 2022-04-23 DIAGNOSIS — R279 Unspecified lack of coordination: Secondary | ICD-10-CM

## 2022-04-23 DIAGNOSIS — R293 Abnormal posture: Secondary | ICD-10-CM

## 2022-04-23 DIAGNOSIS — M6281 Muscle weakness (generalized): Secondary | ICD-10-CM

## 2022-04-23 NOTE — Therapy (Signed)
OUTPATIENT PHYSICAL THERAPY FEMALE PELVIC TREATMENT   Patient Name: Tara Baldwin MRN: [id number] DOB:1991/03/30, 31 y.o., female Today's Date: 04/23/2022   PT End of Session - 04/23/22 0843     Visit Number 2    Number of Visits 12    Date for PT Re-Evaluation 07/09/22    PT Start Time 0845    PT Stop Time 0927    PT Time Calculation (min) 42 min    Activity Tolerance Patient tolerated treatment well             Past Medical History:  Diagnosis Date   Diastasis recti 08/20/2018   Headache    STD (female) 05/16/2017   chlmydia   Past Surgical History:  Procedure Laterality Date   CESAREAN SECTION N/A 12/19/2017   Procedure: CESAREAN SECTION;  Surgeon: Herold Harms, MD;  Location: [facility] ORS;  Service: Obstetrics;  Laterality: N/A;   Patient Active Problem List   Diagnosis Date Noted   Diastasis recti 08/20/2018   Breast lump on right side at 11 o'clock position 10/09/2017    PCP: Saralyn Pilar, MD  REFERRING PROVIDER: Hildred Laser, MD   REFERRING DIAG:  M62.08 (ICD-10-CM) - Diastasis recti   THERAPY DIAG:  Muscle weakness (generalized)  Unspecified lack of coordination  Separation of muscle (nontraumatic), other site  Abnormal posture  Rationale for Evaluation and Treatment: Rehabilitation  ONSET DATE: After 2nd pregnancy  PRECAUTIONS: None  WEIGHT BEARING RESTRICTIONS: No  FALLS:  Has patient fallen in last 6 months? No  OCCUPATION/SOCIAL ACTIVITIES: [employer] up on her feet, traveling  PLOF: Independent   CHIEF CONCERN: Pt has talked to Dr. Valentino Saxon about postpartum anxiety. Dr recommended SAMe but Pt said it was expensive and was referred to a psychiatrist but is hesitant about taking medication. Pt has been able to take more time off from work and spend time at home with family. Pt feels this has helped and will look into talk therapy if things get worse. Pt is breastfeeding for the first time but is pumping, as baby is not  latching on. Pt has been to PFPT before due to having a DRA with first born child. Pt now returns for concerns of returning diastasis. 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 Hx of low back pain but none currently. Pt does have mid-back pain when pumping and feels she has position her body at different angles to completely pump breast milk out.    PATIENT GOALS: Pt would like to close diastasis recti and be in control of her body and core again.    SEXUAL HISTORY/FUNCTION  Pain with intercourse: Initial Penetration Ability to have vaginal penetration:  Yes: have to stop due to pain , burning sensation, 9/10  Able to achieve orgasm?: Yes   OBSTETRICAL HISTORY Vaginal deliveries: G2P2 Tearing: Yes: minimal no stitches  C-section deliveries: 1st delivery  Currently pregnant: No  GYNECOLOGICAL HISTORY Hysterectomy: no Pelvic Organ Prolapse: None Pain with exam: yes, burning  Heaviness/pressure: yes no   SUBJECTIVE:  Pt has no significant changes since last visit. Pt reports that the diaphragmatic breathing has put her to sleep a few times this past week. Pt did have a slight leakage with a sneezing event.    PAIN:  Are you having pain? Yes NPRS: 2/10 LBP    OBJECTIVE:   DIAGNOSTIC TESTING/IMPRESSIONS:    COGNITION: Overall cognitive status: Within functional limits for tasks assessed     POSTURE:  Lumbar lordosis: increased in sitting and standing   Iliac crest height: WNL Pelvic obliquity: WNL   RANGE OF MOTION:    (Norm range in degrees)  LEFT 04/16/22 RIGHT 04/16/22  Lumbar forward flexion (65):  WNL    Lumbar extension (30): WNL    Lumbar lateral flexion (25):  WNL WNL  Thoracic and Lumbar rotation (30 degrees):    WNL WNL  Hip Flexion (0-125):   WNL WNL  Hip IR (0-45):  WNL WNL  Hip ER (0-45):  WNL WNL  Hip Adduction:   WNL WNL  Hip Abduction (0-40):  WNL WNL  Hip extension (0-15):  WNL WNL  (*= pain, Blank rows =  not tested)   STRENGTH: MMT    RLE  LLE   Hip Flexion 4* 4*  Hip Extension 4 4  Hip Abduction     Hip Adduction     Hip ER  4*  4*  Hip IR  5 5  Knee Extension 4* 4*  Knee Flexion 4 4  Dorsiflexion     Plantarflexion (seated) 5 5  (*= fasciculations, Blank rows = not tested)   SPECIAL TESTS:  FABER (SN 81): negative B FADIR (SN 94): negative B     PALPATION:  Abdominal:  Diastasis: 3 finger above umbilicus with DIP depth, 3 fingers at and below umbilicus with DIP depth, observed doming  Scar mobility: present, slight discomfort at scar on the R side with scar restriction noted    EXTERNAL PELVIC EXAM: Patient educated on the purpose of the pelvic exam and articulated understanding; patient consented to the exam verbally.  Breath coordination: present but inconsistent Voluntary Contraction: present, 3/5 MMT Relaxation: delayed Perineal movement with sustained IAP increase ("bear down"): elevation Perineal movement with rapid IAP increase ("cough"): no change (0= no contraction, 1= flicker, 2= weak squeeze, 3= fair squeeze with lift, 4= good squeeze and lift against resistance, 5= strong squeeze against strong resistance)    TODAY'S TREATMENT:   Neuromuscular Re-education  Pre-treatment assessment: LE MMT, LE ROM, special tests, palpation, PFM external exam   Supine hooklying diaphragmatic breathing with VCs and TCs for downregulation of the nervous system and improved IAP management  Supine hooklying PFM lengthening techniques with diaphragmatic breathing, VCs and TCs as needed              B Single knee to chest   Double knee to chest              "Happy baby" pose   Butterfly pose "adductor stretch"   Discussion on toileting posture mechanics and how raised heels does not allow the PFM to lengthen properly/relax. Handout given. Also discussion on c-section scar massage to decrease scar tissue restriction. Handout given.   Patient response to interventions:   Pt enjoyed the "happy baby" pose and felt the tension in her lower back release with all above positions.    Patient Education:  Patient given HEP: [id number] as well as scar massage/toileting posture handout. Patient verbalized understanding. Patient educated throughout session on appropriate technique and form using multi-modal cueing, HEP, and activity modification.  Patient will continue to benefit from further education in order to maximize compliance and understanding for long-term therapeutic gains.    ASSESSMENT:  Clinical Impression: Patient presents to clinic with excellent motivation to participate in today's session. Pt reports 2/10 LBP at beginning of session. Upon physical assessment, Pt demonstrates deficits in IAP management, LE strength, pain, posture, PFM coordination, PFM extensibility, and scar mobility  as evidenced by increased lumbar lordosis in sitting and standing, LBP 2/10 (NPRS), 4/5 MMT with hip flexion/ext/ER and knee ext/flex, fasciculations present with MMT for B hip flex/ER, scar restriction and discomfort upon palpation at c-section scar, urinary leakage with sneezing, present but inconsistent breath coordination, 3/5 MMT PFM, delayed PFM relaxation, and elevation of PFM with sustained IAP increase ("bear down") with significant gluteal/abdominal activation. Pt has a hx of constipation. Pt required moderate VCs for decreased bodily compensations. Pt responded well to active and educational interventions with 0/10 LBP at end of session. Patient will continue to benefit from skilled therapeutic intervention to address deficits in IAP management, LE strength, pain, posture, PFM coordination,  PFM extensibility, and scar mobility  in order to increase PLOF and improve overall QOL.    Objective Impairments: decreased activity tolerance, decreased coordination, decreased endurance, decreased strength, increased fascial restrictions, improper body mechanics, postural  dysfunction, and pain.   Activity Limitations: carrying, lifting, bending, squatting, locomotion level, and caring for others  Personal Factors: Behavior pattern, Past/current experiences, and Time since onset of injury/illness/exacerbation are also affecting patient's functional outcome.   Rehab Potential: Good  Clinical Decision Making: Evolving/moderate complexity  Evaluation Complexity: Moderate   GOALS: Goals reviewed with patient? Yes  SHORT TERM GOALS: Target date: 06/04/2022  Patient will demonstrate independence with HEP in order to maximize therapeutic gains and improve carryover from physical therapy sessions to ADLs in the home and community. Baseline: supine diaphragmatic breathing Goal status: INITIAL    LONG TERM GOALS: Target date: 07/16/2022   Patient will be able to demonstrate improved abdominal strengthening with closure of the diastasis recti to 2 fingers or less in order to return to activities such as, but not limited to: physical activity and caring for young children without limitation or distress.  Baseline: 3 finger width/DIP depth above, at, and below.  Goal status: INITIAL  2.  Pt will be able to coordinate PFM relaxation and contraction and verbalize pain modulation/mindfulness techniques in order to reduce worst pain by at least 2 points and participate in activities such as, but not limited to: penetrative sex and gynecological yearly exams.  Baseline: 9/10 with burning sensation, unable to continue with penetration due to pain  Goal status: INITIAL  3.  Patient will be able to demonstrate improved postural alignment in standing and sitting and overall improved muscular strength in order to reduce bodily compensations from prior pregnancies and allow for a complete resolution of pelvic floor chief concern: DRA.  Baseline: increased lordosis sitting, standing, and supine Goal status: INITIAL  4.  Patient will demonstrate independent and coordinated  diaphragmatic breathing in supine with a 1:2 breathing pattern for improved down-regulation of the nervous system and improved management of intra-abdominal pressures in order to increase function at home and in the community. Baseline: present but inconsistent, delayed relaxation after series of PFM activation during assessment Goal status: INITIAL   PLAN: PT Frequency: 1x/week  PT Duration: 12 weeks  Planned Interventions: Therapeutic exercises, Therapeutic activity, Neuromuscular re-education, Balance training, Gait training, Patient/Family education, Joint mobilization, Spinal mobilization, Cryotherapy, Moist heat, scar mobilization, Taping, and Manual therapy  Plan For Next Session: begin deep core, piriformis stretch    Gena Laski, PT, DPT  04/23/2022, 9:52 AM

## 2022-04-30 ENCOUNTER — Ambulatory Visit: Payer: Managed Care, Other (non HMO)

## 2022-04-30 DIAGNOSIS — R293 Abnormal posture: Secondary | ICD-10-CM

## 2022-04-30 DIAGNOSIS — M6281 Muscle weakness (generalized): Secondary | ICD-10-CM

## 2022-04-30 DIAGNOSIS — R279 Unspecified lack of coordination: Secondary | ICD-10-CM

## 2022-04-30 DIAGNOSIS — M6208 Separation of muscle (nontraumatic), other site: Secondary | ICD-10-CM

## 2022-04-30 NOTE — Therapy (Signed)
OUTPATIENT PHYSICAL THERAPY FEMALE PELVIC TREATMENT   Patient Name: Tara Baldwin MRN: [id number] DOB:1991/07/16, 31 y.o., female Today's Date: 04/30/2022   PT End of Session - 04/30/22 0852     Visit Number 3    Number of Visits 12    Date for PT Re-Evaluation 07/09/22    PT Start Time 0850    PT Stop Time 0930    PT Time Calculation (min) 40 min    Activity Tolerance Patient tolerated treatment well             Past Medical History:  Diagnosis Date   Diastasis recti 08/20/2018   Headache    STD (female) 05/16/2017   chlmydia   Past Surgical History:  Procedure Laterality Date   CESAREAN SECTION N/A 12/19/2017   Procedure: CESAREAN SECTION;  Surgeon: Herold Harms, MD;  Location: [facility] ORS;  Service: Obstetrics;  Laterality: N/A;   Patient Active Problem List   Diagnosis Date Noted   Diastasis recti 08/20/2018   Breast lump on right side at 11 o'clock position 10/09/2017    PCP: Saralyn Pilar, MD  REFERRING PROVIDER: Hildred Laser, MD   REFERRING DIAG:  M62.08 (ICD-10-CM) - Diastasis recti   THERAPY DIAG:  Muscle weakness (generalized)  Unspecified lack of coordination  Separation of muscle (nontraumatic), other site  Abnormal posture  Rationale for Evaluation and Treatment: Rehabilitation  ONSET DATE: After 2nd pregnancy  PRECAUTIONS: None  WEIGHT BEARING RESTRICTIONS: No  FALLS:  Has patient fallen in last 6 months? No  OCCUPATION/SOCIAL ACTIVITIES: [employer] up on her feet, traveling  PLOF: Independent   CHIEF CONCERN: Pt has talked to Dr. Valentino Saxon about postpartum anxiety. Dr recommended SAMe but Pt said it was expensive and was referred to a psychiatrist but is hesitant about taking medication. Pt has been able to take more time off from work and spend time at home with family. Pt feels this has helped and will look into talk therapy if things get worse. Pt is breastfeeding for the first time but is pumping, as baby is not  latching on. Pt has been to PFPT before due to having a DRA with first born child. Pt now returns for concerns of returning diastasis. 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 Hx of low back pain but none currently. Pt does have mid-back pain when pumping and feels she has position her body at different angles to completely pump breast milk out.    PATIENT GOALS: Pt would like to close diastasis recti and be in control of her body and core again.    SEXUAL HISTORY/FUNCTION  Pain with intercourse: Initial Penetration Ability to have vaginal penetration:  Yes: have to stop due to pain , burning sensation, 9/10  Able to achieve orgasm?: Yes   OBSTETRICAL HISTORY Vaginal deliveries: G2P2 Tearing: Yes: minimal no stitches  C-section deliveries: 1st delivery  Currently pregnant: No  GYNECOLOGICAL HISTORY Hysterectomy: no Pelvic Organ Prolapse: None Pain with exam: yes, burning  Heaviness/pressure: yes no   SUBJECTIVE:  Pt has been having stressful days at work. Pt feels she has not had enough time she would like for her HEP. She was able to do her PFM lengthening exercises twice this week. Pt did feel a bit sore but it went away the next day. Pt has been able to be more conscious of log rolling and has been trying to keep up with the technique every time she gets out of bed.  PAIN:  Are you having pain? No NPRS: 0/10 low back    OBJECTIVE:   DIAGNOSTIC TESTING/IMPRESSIONS:    COGNITION: Overall cognitive status: Within functional limits for tasks assessed     POSTURE:  Lumbar lordosis: increased in sitting and standing   Iliac crest height: WNL Pelvic obliquity: WNL   RANGE OF MOTION:    (Norm range in degrees)  LEFT 04/16/22 RIGHT 04/16/22  Lumbar forward flexion (65):  WNL    Lumbar extension (30): WNL    Lumbar lateral flexion (25):  WNL WNL  Thoracic and Lumbar rotation (30 degrees):    WNL WNL  Hip Flexion (0-125):   WNL  WNL  Hip IR (0-45):  WNL WNL  Hip ER (0-45):  WNL WNL  Hip Adduction:   WNL WNL  Hip Abduction (0-40):  WNL WNL  Hip extension (0-15):  WNL WNL  (*= pain, Blank rows = not tested)   STRENGTH: MMT    RLE  LLE   Hip Flexion 4* 4*  Hip Extension 4 4  Hip Abduction     Hip Adduction     Hip ER  4*  4*  Hip IR  5 5  Knee Extension 4* 4*  Knee Flexion 4 4  Dorsiflexion     Plantarflexion (seated) 5 5  (*= fasciculations, Blank rows = not tested)   SPECIAL TESTS:  FABER (SN 81): negative B FADIR (SN 94): negative B     PALPATION:  Abdominal:  Diastasis: 3 finger above umbilicus with DIP depth, 3 fingers at and below umbilicus with DIP depth, observed doming  Scar mobility: present, slight discomfort at scar on the R side with scar restriction noted    EXTERNAL PELVIC EXAM: Patient educated on the purpose of the pelvic exam and articulated understanding; patient consented to the exam verbally.  Breath coordination: present but inconsistent Voluntary Contraction: present, 3/5 MMT Relaxation: delayed Perineal movement with sustained IAP increase ("bear down"): elevation Perineal movement with rapid IAP increase ("cough"): no change (0= no contraction, 1= flicker, 2= weak squeeze, 3= fair squeeze with lift, 4= good squeeze and lift against resistance, 5= strong squeeze against strong resistance)    TODAY'S TREATMENT:   Manual Therapy: Scar mobilizations at c-section scar for improved mobility and scar restriction Scar restriction noted at the R of scar and centrally  Improved discomfort with increased time    Neuromuscular Re-education Supine hooklying diaphragmatic breathing with VCs and TCs for downregulation of the nervous system and improved IAP management  [facility] abdominal [facility]   Supine hooklying TrA contraction with coordinated exhale, VCs and TCs for decreased posterior pelvic tilt   Supine hooklying PFM lengthening techniques with diaphragmatic breathing,  VCs and TCs as needed  Seated piriformis stretch for improved tissue extensibility   Brief discussion on avoiding forward trunk flexion, when possible, especially when she is on the floor with her newborn to decrease worsening of DRA and improved IAP management. Also discussed pairing her breath with the movement if she can't avoid the forward trunk flexion (if newborn is sleeping). Will continue to address in future sessions.   Patient response to interventions:  Pt felt some tension in the gluteals after TrA activation but improved after piriformis stretch.    Patient Education:  Patient given HEP: supine TrA contraction, piriformis stretch. Patient educated throughout session on appropriate technique and form using multi-modal cueing, HEP, and activity modification. Patient will continue to benefit from further education in order to maximize compliance and understanding  for long-term therapeutic gains.    ASSESSMENT:  Clinical Impression: Patient presents to clinic with excellent motivation to participate in today's session. Pt continues to demonstrate deficits in IAP management, LE strength, pain, posture, PFM coordination, PFM extensibility, and scar mobility. Pt presents with 0/10 LBP today. Pt with continued scar restriction at c-section scar causing discomfort, but improved with increased time of manual intervention. Pt required moderate VCs and TCs for beginning of deep core activation to decrease bodily compensations (gluteal activation and posterior pelvic tilt). Pt able to demonstrate improvement with TrA activation with increased time. Pt responded well to all active, manual, and educational interventions. Patient will continue to benefit from skilled therapeutic intervention to address deficits in IAP management, LE strength, pain, posture, PFM coordination,  PFM extensibility, and scar mobility  in order to increase PLOF and improve overall QOL.    Objective Impairments: decreased  activity tolerance, decreased coordination, decreased endurance, decreased strength, increased fascial restrictions, improper body mechanics, postural dysfunction, and pain.   Activity Limitations: carrying, lifting, bending, squatting, locomotion level, and caring for others  Personal Factors: Behavior pattern, Past/current experiences, and Time since onset of injury/illness/exacerbation are also affecting patient's functional outcome.   Rehab Potential: Good  Clinical Decision Making: Evolving/moderate complexity  Evaluation Complexity: Moderate   GOALS: Goals reviewed with patient? Yes  SHORT TERM GOALS: Target date: 06/11/2022  Patient will demonstrate independence with HEP in order to maximize therapeutic gains and improve carryover from physical therapy sessions to ADLs in the home and community. Baseline: supine diaphragmatic breathing Goal status: INITIAL    LONG TERM GOALS: Target date: 07/23/2022   Patient will be able to demonstrate improved abdominal strengthening with closure of the diastasis recti to 2 fingers or less in order to return to activities such as, but not limited to: physical activity and caring for young children without limitation or distress.  Baseline: 3 finger width/DIP depth above, at, and below.  Goal status: INITIAL  2.  Pt will be able to coordinate PFM relaxation and contraction and verbalize pain modulation/mindfulness techniques in order to reduce worst pain by at least 2 points and participate in activities such as, but not limited to: penetrative sex and gynecological yearly exams.  Baseline: 9/10 with burning sensation, unable to continue with penetration due to pain  Goal status: INITIAL  3.  Patient will be able to demonstrate improved postural alignment in standing and sitting and overall improved muscular strength in order to reduce bodily compensations from prior pregnancies and allow for a complete resolution of pelvic floor chief  concern: DRA.  Baseline: increased lordosis sitting, standing, and supine Goal status: INITIAL  4.  Patient will demonstrate independent and coordinated diaphragmatic breathing in supine with a 1:2 breathing pattern for improved down-regulation of the nervous system and improved management of intra-abdominal pressures in order to increase function at home and in the community. Baseline: present but inconsistent, delayed relaxation after series of PFM activation during assessment Goal status: INITIAL   PLAN: PT Frequency: 1x/week  PT Duration: 12 weeks  Planned Interventions: Therapeutic exercises, Therapeutic activity, Neuromuscular re-education, Balance training, Gait training, Patient/Family education, Joint mobilization, Spinal mobilization, Cryotherapy, Moist heat, scar mobilization, Taping, and Manual therapy  Plan For Next Session: how is deep core? Progression, DRAM technique manual    Bretta Fees, PT, DPT  04/30/2022, 8:53 AM

## 2022-05-07 ENCOUNTER — Ambulatory Visit: Payer: Managed Care, Other (non HMO) | Attending: Obstetrics and Gynecology

## 2022-05-07 DIAGNOSIS — R293 Abnormal posture: Secondary | ICD-10-CM | POA: Insufficient documentation

## 2022-05-07 DIAGNOSIS — M6208 Separation of muscle (nontraumatic), other site: Secondary | ICD-10-CM | POA: Diagnosis present

## 2022-05-07 DIAGNOSIS — R279 Unspecified lack of coordination: Secondary | ICD-10-CM | POA: Insufficient documentation

## 2022-05-07 DIAGNOSIS — M6281 Muscle weakness (generalized): Secondary | ICD-10-CM | POA: Insufficient documentation

## 2022-05-07 NOTE — Therapy (Signed)
OUTPATIENT PHYSICAL THERAPY FEMALE PELVIC TREATMENT   Patient Name: Tara Baldwin MRN: [id number] DOB:02-20-1991, 31 y.o., female Today's Date: 05/07/2022   PT End of Session - 05/07/22 1023     Visit Number 4    Number of Visits 12    Date for PT Re-Evaluation 07/09/22    PT Start Time 1025    PT Stop Time 1055    PT Time Calculation (min) 30 min    Activity Tolerance Patient tolerated treatment well             Past Medical History:  Diagnosis Date   Diastasis recti 08/20/2018   Headache    STD (female) 05/16/2017   chlmydia   Past Surgical History:  Procedure Laterality Date   CESAREAN SECTION N/A 12/19/2017   Procedure: CESAREAN SECTION;  Surgeon: Herold Harms, MD;  Location: [facility] ORS;  Service: Obstetrics;  Laterality: N/A;   Patient Active Problem List   Diagnosis Date Noted   Diastasis recti 08/20/2018   Breast lump on right side at 11 o'clock position 10/09/2017    PCP: Saralyn Pilar, MD  REFERRING PROVIDER: Hildred Laser, MD   REFERRING DIAG:  M62.08 (ICD-10-CM) - Diastasis recti   THERAPY DIAG:  Muscle weakness (generalized)  Unspecified lack of coordination  Separation of muscle (nontraumatic), other site  Abnormal posture  Rationale for Evaluation and Treatment: Rehabilitation  ONSET DATE: After 2nd pregnancy  PRECAUTIONS: None  WEIGHT BEARING RESTRICTIONS: No  FALLS:  Has patient fallen in last 6 months? No  OCCUPATION/SOCIAL ACTIVITIES: [employer] up on her feet, traveling  PLOF: Independent   CHIEF CONCERN: Pt has talked to Dr. Valentino Saxon about postpartum anxiety. Dr recommended SAMe but Pt said it was expensive and was referred to a psychiatrist but is hesitant about taking medication. Pt has been able to take more time off from work and spend time at home with family. Pt feels this has helped and will look into talk therapy if things get worse. Pt is breastfeeding for the first time but is pumping, as baby is not  latching on. Pt has been to PFPT before due to having a DRA with first born child. Pt now returns for concerns of returning diastasis. 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 Hx of low back pain but none currently. Pt does have mid-back pain when pumping and feels she has position her body at different angles to completely pump breast milk out.    PATIENT GOALS: Pt would like to close diastasis recti and be in control of her body and core again.    SEXUAL HISTORY/FUNCTION  Pain with intercourse: Initial Penetration Ability to have vaginal penetration:  Yes: have to stop due to pain , burning sensation, 9/10  Able to achieve orgasm?: Yes   OBSTETRICAL HISTORY Vaginal deliveries: G2P2 Tearing: Yes: minimal no stitches  C-section deliveries: 1st delivery  Currently pregnant: No  GYNECOLOGICAL HISTORY Hysterectomy: no Pelvic Organ Prolapse: None Pain with exam: yes, burning  Heaviness/pressure: yes no   SUBJECTIVE:  Pt arrived 10 mins after scheduled appt. Pthas been doing TrA activation technique at home and is going really well.    PAIN:  Are you having pain? No    OBJECTIVE:   DIAGNOSTIC TESTING/IMPRESSIONS:    COGNITION: Overall cognitive status: Within functional limits for tasks assessed     POSTURE:  Lumbar lordosis: increased in sitting and standing   Iliac crest height: WNL Pelvic obliquity: WNL  RANGE OF MOTION:    (Norm range in degrees)  LEFT 04/16/22 RIGHT 04/16/22  Lumbar forward flexion (65):  WNL    Lumbar extension (30): WNL    Lumbar lateral flexion (25):  WNL WNL  Thoracic and Lumbar rotation (30 degrees):    WNL WNL  Hip Flexion (0-125):   WNL WNL  Hip IR (0-45):  WNL WNL  Hip ER (0-45):  WNL WNL  Hip Adduction:   WNL WNL  Hip Abduction (0-40):  WNL WNL  Hip extension (0-15):  WNL WNL  (*= pain, Blank rows = not tested)   STRENGTH: MMT    RLE  LLE   Hip Flexion 4* 4*  Hip Extension 4 4  Hip  Abduction     Hip Adduction     Hip ER  4*  4*  Hip IR  5 5  Knee Extension 4* 4*  Knee Flexion 4 4  Dorsiflexion     Plantarflexion (seated) 5 5  (*= fasciculations, Blank rows = not tested)   SPECIAL TESTS:  FABER (SN 81): negative B FADIR (SN 94): negative B     PALPATION:  Abdominal:  Diastasis: 3 finger above umbilicus with DIP depth, 3 fingers at and below umbilicus with DIP depth, observed doming  Scar mobility: present, slight discomfort at scar on the R side with scar restriction noted    EXTERNAL PELVIC EXAM: Patient educated on the purpose of the pelvic exam and articulated understanding; patient consented to the exam verbally.  Breath coordination: present but inconsistent Voluntary Contraction: present, 3/5 MMT Relaxation: delayed Perineal movement with sustained IAP increase ("bear down"): elevation Perineal movement with rapid IAP increase ("cough"): no change (0= no contraction, 1= flicker, 2= weak squeeze, 3= fair squeeze with lift, 4= good squeeze and lift against resistance, 5= strong squeeze against strong resistance)    TODAY'S TREATMENT:   Manual Therapy: DRAM technique on quadruped with shoulder flexion and rotation, B 3x5    Neuromuscular Re-education Quadruped diaphragmatic breathing for improved IAP management, VCs and TCs as needed   Quadruped TrA activation with coordinated breath, VCs and TCs as needed   Brief discussion on DRA taping, but Pt reports occasional bruising and bleeding when removing adhesive. DPT advised Pt that will avoid because of skin reaction, but will continue to challenge the deep core in coming sessions.   Patient response to interventions:  Pt felt the difference between a gentle TrA activation in quadruped vs forceful pulling in of abdominals   Patient Education:  Patient given HEP: quadruped diaphragmatic breathing/TrA activation. Patient educated throughout session on appropriate technique and form using  multi-modal cueing, HEP, and activity modification. Patient will continue to benefit from further education in order to maximize compliance and understanding for long-term therapeutic gains.    ASSESSMENT:  Clinical Impression: Patient presents to clinic with excellent motivation to participate in today's session. Pt continues to demonstrate deficits in IAP management, LE strength, pain, posture, PFM coordination, PFM extensibility, and scar mobility. Pt required significant cueing during quadruped interventions to decrease bodily compensations of forecful drawing in of abdominals during breath/TrA activation. Pt improved with increased time. After discussion, due to previous skin reactions to adhesive taping, will not use taping for DRA. Pt responded well to all active, manual, and educational interventions. Patient will continue to benefit from skilled therapeutic intervention to address deficits in IAP management, LE strength, pain, posture, PFM coordination,  PFM extensibility, and scar mobility  in order to increase PLOF and improve  overall QOL.    Objective Impairments: decreased activity tolerance, decreased coordination, decreased endurance, decreased strength, increased fascial restrictions, improper body mechanics, postural dysfunction, and pain.   Activity Limitations: carrying, lifting, bending, squatting, locomotion level, and caring for others  Personal Factors: Behavior pattern, Past/current experiences, and Time since onset of injury/illness/exacerbation are also affecting patient's functional outcome.   Rehab Potential: Good  Clinical Decision Making: Evolving/moderate complexity  Evaluation Complexity: Moderate   GOALS: Goals reviewed with patient? Yes  SHORT TERM GOALS: Target date: 06/18/2022  Patient will demonstrate independence with HEP in order to maximize therapeutic gains and improve carryover from physical therapy sessions to ADLs in the home and  community. Baseline: supine diaphragmatic breathing Goal status: INITIAL    LONG TERM GOALS: Target date: 07/30/2022   Patient will be able to demonstrate improved abdominal strengthening with closure of the diastasis recti to 2 fingers or less in order to return to activities such as, but not limited to: physical activity and caring for young children without limitation or distress.  Baseline: 3 finger width/DIP depth above, at, and below.  Goal status: INITIAL  2.  Pt will be able to coordinate PFM relaxation and contraction and verbalize pain modulation/mindfulness techniques in order to reduce worst pain by at least 2 points and participate in activities such as, but not limited to: penetrative sex and gynecological yearly exams.  Baseline: 9/10 with burning sensation, unable to continue with penetration due to pain  Goal status: INITIAL  3.  Patient will be able to demonstrate improved postural alignment in standing and sitting and overall improved muscular strength in order to reduce bodily compensations from prior pregnancies and allow for a complete resolution of pelvic floor chief concern: DRA.  Baseline: increased lordosis sitting, standing, and supine Goal status: INITIAL  4.  Patient will demonstrate independent and coordinated diaphragmatic breathing in supine with a 1:2 breathing pattern for improved down-regulation of the nervous system and improved management of intra-abdominal pressures in order to increase function at home and in the community. Baseline: present but inconsistent, delayed relaxation after series of PFM activation during assessment Goal status: INITIAL   PLAN: PT Frequency: 1x/week  PT Duration: 12 weeks  Planned Interventions: Therapeutic exercises, Therapeutic activity, Neuromuscular re-education, Balance training, Gait training, Patient/Family education, Joint mobilization, Spinal mobilization, Cryotherapy, Moist heat, scar mobilization, Taping, and  Manual therapy  Plan For Next Session: DRA manual technique again, continue progression of deep core, standing? Or squat/lifting techniques   Riley Papin, PT, DPT  05/07/2022, 10:23 AM

## 2022-05-13 ENCOUNTER — Ambulatory Visit: Payer: Managed Care, Other (non HMO)

## 2022-05-13 DIAGNOSIS — R279 Unspecified lack of coordination: Secondary | ICD-10-CM

## 2022-05-13 DIAGNOSIS — M6208 Separation of muscle (nontraumatic), other site: Secondary | ICD-10-CM

## 2022-05-13 DIAGNOSIS — M6281 Muscle weakness (generalized): Secondary | ICD-10-CM | POA: Diagnosis not present

## 2022-05-13 DIAGNOSIS — R293 Abnormal posture: Secondary | ICD-10-CM

## 2022-05-13 NOTE — Therapy (Signed)
OUTPATIENT PHYSICAL THERAPY FEMALE PELVIC TREATMENT   Patient Name: Tara Baldwin MRN: [id number] DOB:[birth year], 31 y.o., female Today's Date: 05/13/2022   PT End of Session - 05/13/22 0803     Visit Number 5    Number of Visits 12    Date for PT Re-Evaluation 07/09/22    PT Start Time 0801    PT Stop Time 0841    PT Time Calculation (min) 40 min    Activity Tolerance Patient tolerated treatment well             Past Medical History:  Diagnosis Date   Diastasis recti 08/20/2018   Headache    STD (female) 05/16/2017   chlmydia   Past Surgical History:  Procedure Laterality Date   CESAREAN SECTION N/A 12/19/2017   Procedure: CESAREAN SECTION;  Surgeon: Herold Harms, MD;  Location: [facility] ORS;  Service: Obstetrics;  Laterality: N/A;   Patient Active Problem List   Diagnosis Date Noted   Diastasis recti 08/20/2018   Breast lump on right side at 11 o'clock position 10/09/2017    PCP: Saralyn Pilar, MD  REFERRING PROVIDER: Hildred Laser, MD   REFERRING DIAG:  M62.08 (ICD-10-CM) - Diastasis recti   THERAPY DIAG:  Muscle weakness (generalized)  Unspecified lack of coordination  Separation of muscle (nontraumatic), other site  Abnormal posture  Rationale for Evaluation and Treatment: Rehabilitation  ONSET DATE: After 2nd pregnancy  PRECAUTIONS: None  WEIGHT BEARING RESTRICTIONS: No  FALLS:  Has patient fallen in last 6 months? No  OCCUPATION/SOCIAL ACTIVITIES: [employer] up on her feet, traveling  PLOF: Independent   CHIEF CONCERN: Pt has talked to Dr. Valentino Saxon about postpartum anxiety. Dr recommended SAMe but Pt said it was expensive and was referred to a psychiatrist but is hesitant about taking medication. Pt has been able to take more time off from work and spend time at home with family. Pt feels this has helped and will look into talk therapy if things get worse. Pt is breastfeeding for the first time but is pumping, as baby is not  latching on. Pt has been to PFPT before due to having a DRA with first born child. Pt now returns for concerns of returning diastasis. 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 Hx of low back pain but none currently. Pt does have mid-back pain when pumping and feels she has position her body at different angles to completely pump breast milk out.    PATIENT GOALS: Pt would like to close diastasis recti and be in control of her body and core again.    SEXUAL HISTORY/FUNCTION  Pain with intercourse: Initial Penetration Ability to have vaginal penetration:  Yes: have to stop due to pain , burning sensation, 9/10  Able to achieve orgasm?: Yes   OBSTETRICAL HISTORY Vaginal deliveries: G2P2 Tearing: Yes: minimal no stitches  C-section deliveries: 1st delivery  Currently pregnant: No  GYNECOLOGICAL HISTORY Hysterectomy: no Pelvic Organ Prolapse: None Pain with exam: yes, burning  Heaviness/pressure: yes no   SUBJECTIVE:  Pt has had a busy week and did not have too much time for HEP. Pt did have a busy Saturday on her feet with limited back pain while running around with her kids.    PAIN:  Are you having pain? Yes NPRS Scale: in between shoulder blades, 3/10    OBJECTIVE:   DIAGNOSTIC TESTING/IMPRESSIONS:    COGNITION: Overall cognitive status: Within functional limits for tasks assessed  POSTURE:  Lumbar lordosis: increased in sitting and standing   Iliac crest height: WNL Pelvic obliquity: WNL   RANGE OF MOTION:    (Norm range in degrees)  LEFT 04/16/22 RIGHT 04/16/22  Lumbar forward flexion (65):  WNL    Lumbar extension (30): WNL    Lumbar lateral flexion (25):  WNL WNL  Thoracic and Lumbar rotation (30 degrees):    WNL WNL  Hip Flexion (0-125):   WNL WNL  Hip IR (0-45):  WNL WNL  Hip ER (0-45):  WNL WNL  Hip Adduction:   WNL WNL  Hip Abduction (0-40):  WNL WNL  Hip extension (0-15):  WNL WNL  (*= pain, Blank rows =  not tested)   STRENGTH: MMT    RLE  LLE   Hip Flexion 4* 4*  Hip Extension 4 4  Hip Abduction     Hip Adduction     Hip ER  4*  4*  Hip IR  5 5  Knee Extension 4* 4*  Knee Flexion 4 4  Dorsiflexion     Plantarflexion (seated) 5 5  (*= fasciculations, Blank rows = not tested)   SPECIAL TESTS:  FABER (SN 81): negative B FADIR (SN 94): negative B     PALPATION:  Abdominal:  Diastasis: 3 finger above umbilicus with DIP depth, 3 fingers at and below umbilicus with DIP depth, observed doming  Scar mobility: present, slight discomfort at scar on the R side with scar restriction noted    EXTERNAL PELVIC EXAM: Patient educated on the purpose of the pelvic exam and articulated understanding; patient consented to the exam verbally.  Breath coordination: present but inconsistent Voluntary Contraction: present, 3/5 MMT Relaxation: delayed Perineal movement with sustained IAP increase ("bear down"): elevation Perineal movement with rapid IAP increase ("cough"): no change (0= no contraction, 1= flicker, 2= weak squeeze, 3= fair squeeze with lift, 4= good squeeze and lift against resistance, 5= strong squeeze against strong resistance)    TODAY'S TREATMENT:   Manual Therapy: DRAM technique on quadruped with shoulder flexion and rotation, B 3x5    Neuromuscular Re-education Pre-treatment assessment:   Brief assessment of DRA Abdominal:  Diastasis: 2.5 finger above umbilicus with DIP depth, 3 fingers at and below umbilicus with DIP depth, observed doming   Lifting techniques (mini squat and squat) with coordinated breath for improved body mechanics and pain modulation  VCs and TCs for decreased bodily compensation, increased shoulder height  Corner pose for pain modulation after breastfeeding   Modified plank with mini squat at wall for improved IAP management, VCs and TCs as needed  Patient response to interventions:  Pt is comfortable to change sessions to every other  week   Patient Education:  Patient given HEP: corner stretch, modified plank at wall with mini squat. Patient educated throughout session on appropriate technique and form using multi-modal cueing, HEP, and activity modification. Patient will continue to benefit from further education in order to maximize compliance and understanding for long-term therapeutic gains.    ASSESSMENT:  Clinical Impression: Patient presents to clinic with excellent motivation to participate in today's session. Pt continues to demonstrate deficits in IAP management, LE strength, pain, posture, PFM coordination, PFM extensibility, and scar mobility. Pt with increased B posterior shoulder pain, 3/10 and reports more likely due to tension/stress. Upon brief reassessment of DRA, Pt continues to demonstrate deficits in IAP management with improvement of DRA above the umbilicus, 2.5 fingers with DIP depth (3 fingers and DIP depth on IE). Pt  also continues to demonstrate doming. Pt required moderate VCs and TCs during lifting techniques for proper body mechanics and coordination with breath. Pt positively responded to standing deep core activation. Pt responded well to all active, manual, and educational interventions. Patient will continue to benefit from skilled therapeutic intervention to address deficits in IAP management, LE strength, pain, posture, PFM coordination,  PFM extensibility, and scar mobility  in order to increase PLOF and improve overall QOL.    Objective Impairments: decreased activity tolerance, decreased coordination, decreased endurance, decreased strength, increased fascial restrictions, improper body mechanics, postural dysfunction, and pain.   Activity Limitations: carrying, lifting, bending, squatting, locomotion level, and caring for others  Personal Factors: Behavior pattern, Past/current experiences, and Time since onset of injury/illness/exacerbation are also affecting patient's functional outcome.    Rehab Potential: Good  Clinical Decision Making: Evolving/moderate complexity  Evaluation Complexity: Moderate   GOALS: Goals reviewed with patient? Yes  SHORT TERM GOALS: Target date: 06/24/2022  Patient will demonstrate independence with HEP in order to maximize therapeutic gains and improve carryover from physical therapy sessions to ADLs in the home and community. Baseline: supine diaphragmatic breathing Goal status: INITIAL    LONG TERM GOALS: Target date: 08/05/2022   Patient will be able to demonstrate improved abdominal strengthening with closure of the diastasis recti to 2 fingers or less in order to return to activities such as, but not limited to: physical activity and caring for young children without limitation or distress.  Baseline: 3 finger width/DIP depth above, at, and below.  Goal status: INITIAL  2.  Pt will be able to coordinate PFM relaxation and contraction and verbalize pain modulation/mindfulness techniques in order to reduce worst pain by at least 2 points and participate in activities such as, but not limited to: penetrative sex and gynecological yearly exams.  Baseline: 9/10 with burning sensation, unable to continue with penetration due to pain  Goal status: INITIAL  3.  Patient will be able to demonstrate improved postural alignment in standing and sitting and overall improved muscular strength in order to reduce bodily compensations from prior pregnancies and allow for a complete resolution of pelvic floor chief concern: DRA.  Baseline: increased lordosis sitting, standing, and supine Goal status: INITIAL  4.  Patient will demonstrate independent and coordinated diaphragmatic breathing in supine with a 1:2 breathing pattern for improved down-regulation of the nervous system and improved management of intra-abdominal pressures in order to increase function at home and in the community. Baseline: present but inconsistent, delayed relaxation after  series of PFM activation during assessment Goal status: INITIAL   PLAN: PT Frequency: 1x/week  PT Duration: 12 weeks  Planned Interventions: Therapeutic exercises, Therapeutic activity, Neuromuscular re-education, Balance training, Gait training, Patient/Family education, Joint mobilization, Spinal mobilization, Cryotherapy, Moist heat, scar mobilization, Taping, and Manual therapy  Plan For Next Session: how did the 2 weeks go?, standing deep core/deep core progression.    Seriyah Collison, PT, DPT  05/13/2022, 8:04 AM

## 2022-05-27 ENCOUNTER — Ambulatory Visit: Payer: Managed Care, Other (non HMO)

## 2022-05-27 DIAGNOSIS — R293 Abnormal posture: Secondary | ICD-10-CM

## 2022-05-27 DIAGNOSIS — M6281 Muscle weakness (generalized): Secondary | ICD-10-CM

## 2022-05-27 DIAGNOSIS — R279 Unspecified lack of coordination: Secondary | ICD-10-CM

## 2022-05-27 DIAGNOSIS — M6208 Separation of muscle (nontraumatic), other site: Secondary | ICD-10-CM

## 2022-05-27 NOTE — Therapy (Signed)
OUTPATIENT PHYSICAL THERAPY FEMALE PELVIC TREATMENT   Patient Name: Tara Baldwin MRN: [id number] DOB:05-10-1991, 31 y.o., female Today's Date: 05/27/2022   PT End of Session - 05/27/22 0807     Visit Number 6    Number of Visits 12    Date for PT Re-Evaluation 07/09/22    Authorization Type IE: 04/16/22    PT Start Time 0808    PT Stop Time 0840    PT Time Calculation (min) 32 min    Activity Tolerance Patient tolerated treatment well             Past Medical History:  Diagnosis Date   Diastasis recti 08/20/2018   Headache    STD (female) 05/16/2017   chlmydia   Past Surgical History:  Procedure Laterality Date   CESAREAN SECTION N/A 12/19/2017   Procedure: CESAREAN SECTION;  Surgeon: Herold Harms, MD;  Location: [facility] ORS;  Service: Obstetrics;  Laterality: N/A;   Patient Active Problem List   Diagnosis Date Noted   Diastasis recti 08/20/2018   Breast lump on right side at 11 o'clock position 10/09/2017    PCP: Saralyn Pilar, MD  REFERRING PROVIDER: Hildred Laser, MD   REFERRING DIAG:  M62.08 (ICD-10-CM) - Diastasis recti   THERAPY DIAG:  Muscle weakness (generalized)  Unspecified lack of coordination  Separation of muscle (nontraumatic), other site  Abnormal posture  Rationale for Evaluation and Treatment: Rehabilitation  ONSET DATE: After 2nd pregnancy  PRECAUTIONS: None  WEIGHT BEARING RESTRICTIONS: No  FALLS:  Has patient fallen in last 6 months? No  OCCUPATION/SOCIAL ACTIVITIES: [employer] up on her feet, traveling  PLOF: Independent   CHIEF CONCERN: Pt has talked to Dr. Valentino Saxon about postpartum anxiety. Dr recommended SAMe but Pt said it was expensive and was referred to a psychiatrist but is hesitant about taking medication. Pt has been able to take more time off from work and spend time at home with family. Pt feels this has helped and will look into talk therapy if things get worse. Pt is breastfeeding for the first  time but is pumping, as baby is not latching on. Pt has been to PFPT before due to having a DRA with first born child. Pt now returns for concerns of returning diastasis. 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 Hx of low back pain but none currently. Pt does have mid-back pain when pumping and feels she has position her body at different angles to completely pump breast milk out.    PATIENT GOALS: Pt would like to close diastasis recti and be in control of her body and core again.    SEXUAL HISTORY/FUNCTION  Pain with intercourse: Initial Penetration Ability to have vaginal penetration:  Yes: have to stop due to pain , burning sensation, 9/10  Able to achieve orgasm?: Yes   OBSTETRICAL HISTORY Vaginal deliveries: G2P2 Tearing: Yes: minimal no stitches  C-section deliveries: 1st delivery  Currently pregnant: No  GYNECOLOGICAL HISTORY Hysterectomy: no Pelvic Organ Prolapse: None Pain with exam: yes, burning  Heaviness/pressure: yes no   SUBJECTIVE:  Pt arrived 8 mins after scheduled time. Pt came in with an antalgic gait because on the way into the hospital, she took a wrong step. Pt has been struggling with HEP in quadruped due to not feeling fully focused. Pt also has been practicing breathing with lifting her newborn and younger son.    PAIN:  Are you having pain? Yes NPRS Scale: R ankle 7/10  OBJECTIVE:   DIAGNOSTIC TESTING/IMPRESSIONS:    COGNITION: Overall cognitive status: Within functional limits for tasks assessed     POSTURE:  Lumbar lordosis: increased in sitting and standing   Iliac crest height: WNL Pelvic obliquity: WNL   RANGE OF MOTION:    (Norm range in degrees)  LEFT 04/16/22 RIGHT 04/16/22  Lumbar forward flexion (65):  WNL    Lumbar extension (30): WNL    Lumbar lateral flexion (25):  WNL WNL  Thoracic and Lumbar rotation (30 degrees):    WNL WNL  Hip Flexion (0-125):   WNL WNL  Hip IR (0-45):  WNL  WNL  Hip ER (0-45):  WNL WNL  Hip Adduction:   WNL WNL  Hip Abduction (0-40):  WNL WNL  Hip extension (0-15):  WNL WNL  (*= pain, Blank rows = not tested)   STRENGTH: MMT    RLE  LLE   Hip Flexion 4* 4*  Hip Extension 4 4  Hip Abduction     Hip Adduction     Hip ER  4*  4*  Hip IR  5 5  Knee Extension 4* 4*  Knee Flexion 4 4  Dorsiflexion     Plantarflexion (seated) 5 5  (*= fasciculations, Blank rows = not tested)   SPECIAL TESTS:  FABER (SN 81): negative B FADIR (SN 94): negative B     PALPATION:  Abdominal:  Diastasis: 3 finger above umbilicus with DIP depth, 3 fingers at and below umbilicus with DIP depth, observed doming  Scar mobility: present, slight discomfort at scar on the R side with scar restriction noted    EXTERNAL PELVIC EXAM: Patient educated on the purpose of the pelvic exam and articulated understanding; patient consented to the exam verbally.  Breath coordination: present but inconsistent Voluntary Contraction: present, 3/5 MMT Relaxation: delayed Perineal movement with sustained IAP increase ("bear down"): elevation Perineal movement with rapid IAP increase ("cough"): no change (0= no contraction, 1= flicker, 2= weak squeeze, 3= fair squeeze with lift, 4= good squeeze and lift against resistance, 5= strong squeeze against strong resistance)    TODAY'S TREATMENT:   Neuromuscular Re-education Supine hooklying diaphragmatic breathing with VCs and TCs for downregulation of the nervous system and improved management of IAP  Supine hooklying TrA contraction with UE challenge and coordinated breath for improved IAP management   Supine hooklying TrA contraction with bent knee fall outs and coordinated breath for improved IAP management   Review of some LTG's below in relation to pelvic pain. Will assess FOTO next visit.   Patient response to interventions:  Pt feels her TrA activation    Patient Education:  Patient given HEP: supine TrA  activation with bent knee fall outs and punches. Patient educated throughout session on appropriate technique and form using multi-modal cueing, HEP, and activity modification. Patient will continue to benefit from further education in order to maximize compliance and understanding for long-term therapeutic gains.    ASSESSMENT:  Clinical Impression: Patient presents to clinic with excellent motivation to participate in today's session. Pt continues to demonstrate deficits in IAP management, LE strength, pain, posture, PFM coordination, PFM extensibility, and scar mobility. Pt arrived to clinic with a pain in the R ankle as she took a wrong step while walking to hospital 7/10 (during amb). Session continued with decreased weightbearing on R and cold modality placed. Pt required moderate VCs and TCs during progression of TrA activation in supine. Pt responded well to all active and educational interventions. Pt ended  session with no pain at R ankle. Patient will continue to benefit from skilled therapeutic intervention to address deficits in IAP management, LE strength, pain, posture, PFM coordination,  PFM extensibility, and scar mobility  in order to increase PLOF and improve overall QOL.    Objective Impairments: decreased activity tolerance, decreased coordination, decreased endurance, decreased strength, increased fascial restrictions, improper body mechanics, postural dysfunction, and pain.   Activity Limitations: carrying, lifting, bending, squatting, locomotion level, and caring for others  Personal Factors: Behavior pattern, Past/current experiences, and Time since onset of injury/illness/exacerbation are also affecting patient's functional outcome.   Rehab Potential: Good  Clinical Decision Making: Evolving/moderate complexity  Evaluation Complexity: Moderate   GOALS: Goals reviewed with patient? Yes  SHORT TERM GOALS: Target date: 07/08/2022  Patient will demonstrate independence  with HEP in order to maximize therapeutic gains and improve carryover from physical therapy sessions to ADLs in the home and community. Baseline: supine diaphragmatic breathing Goal status: INITIAL    LONG TERM GOALS: Target date: 08/19/2022 (07/09/22)  Patient will be able to demonstrate improved abdominal strengthening with closure of the diastasis recti to 2 fingers or less in order to return to activities such as, but not limited to: physical activity and caring for young children without limitation or distress.  Baseline: 3 finger width/DIP depth above, at, and below.  Goal status: INITIAL  2.  Pt will be able to coordinate PFM relaxation and contraction and verbalize pain modulation/mindfulness techniques in order to reduce worst pain by at least 2 points and participate in activities such as, but not limited to: penetrative sex and gynecological yearly exams.  Baseline: 9/10 with burning sensation, unable to continue with penetration due to pain; (8/21): 3/10 with penetrative sex Goal status: IN PROGRESS  3.  Patient will be able to demonstrate improved postural alignment in standing and sitting and overall improved muscular strength in order to reduce bodily compensations from prior pregnancies and allow for a complete resolution of pelvic floor chief concern: DRA.  Baseline: increased lordosis sitting, standing, and supine Goal status: INITIAL  4.  Patient will demonstrate independent and coordinated diaphragmatic breathing in supine with a 1:2 breathing pattern for improved down-regulation of the nervous system and improved management of intra-abdominal pressures in order to increase function at home and in the community. Baseline: present but inconsistent, delayed relaxation after series of PFM activation during assessment Goal status: INITIAL   PLAN: PT Frequency: 1x/week  PT Duration: 12 weeks  Planned Interventions: Therapeutic exercises, Therapeutic activity, Neuromuscular  re-education, Balance training, Gait training, Patient/Family education, Joint mobilization, Spinal mobilization, Cryotherapy, Moist heat, scar mobilization, Taping, and Manual therapy  Plan For Next Session: standing deep core/deep core progression/check quadruped, [employer], PT, DPT  05/27/2022, 8:08 AM
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
## 2022-06-06 ENCOUNTER — Encounter: Payer: Self-pay | Admitting: Obstetrics and Gynecology

## 2022-06-06 ENCOUNTER — Ambulatory Visit (INDEPENDENT_AMBULATORY_CARE_PROVIDER_SITE_OTHER): Payer: Managed Care, Other (non HMO) | Admitting: Obstetrics and Gynecology

## 2022-06-06 VITALS — BP 111/83 | HR 56 | Resp 16 | Ht 66.0 in | Wt 167.8 lb

## 2022-06-06 DIAGNOSIS — Z3009 Encounter for other general counseling and advice on contraception: Secondary | ICD-10-CM | POA: Diagnosis not present

## 2022-06-06 DIAGNOSIS — Z01419 Encounter for gynecological examination (general) (routine) without abnormal findings: Secondary | ICD-10-CM | POA: Diagnosis not present

## 2022-06-11 ENCOUNTER — Encounter: Payer: Self-pay | Admitting: Obstetrics and Gynecology

## 2022-06-11 ENCOUNTER — Ambulatory Visit: Payer: Managed Care, Other (non HMO)

## 2022-06-17 ENCOUNTER — Encounter: Payer: Self-pay | Admitting: Family Medicine

## 2022-06-17 ENCOUNTER — Telehealth (INDEPENDENT_AMBULATORY_CARE_PROVIDER_SITE_OTHER): Payer: Managed Care, Other (non HMO) | Admitting: Family Medicine

## 2022-06-17 ENCOUNTER — Telehealth: Payer: Self-pay | Admitting: Obstetrics and Gynecology

## 2022-06-17 VITALS — Wt 167.0 lb

## 2022-06-17 DIAGNOSIS — K5901 Slow transit constipation: Secondary | ICD-10-CM

## 2022-06-17 DIAGNOSIS — G8929 Other chronic pain: Secondary | ICD-10-CM

## 2022-06-17 DIAGNOSIS — K599 Functional intestinal disorder, unspecified: Secondary | ICD-10-CM

## 2022-06-17 DIAGNOSIS — R109 Unspecified abdominal pain: Secondary | ICD-10-CM | POA: Diagnosis not present

## 2022-06-17 NOTE — Telephone Encounter (Signed)
Patient calling about FMLA forms that was filled out for her husband. She is stating that physician portion wasn't filled out. Wouldlike to speak with someone to see why?  CB# [phone]

## 2022-06-17 NOTE — Patient Instructions (Addendum)
Thank you for coming to the office today.  Please call Dr Servando Snare this week at [facility]) [address] [place], [place] [address] Main: [phone]  We discussed Bentyl (Dicyclomine) as an option for bowel spasms and abdominal pain, but it is not safe to take if you are breastfeeding.  Please discuss further with GI for considering testing and treatment options.  Please schedule a Follow-up Appointment to: Return if symptoms worsen or fail to improve.  If you have any other questions or concerns, please feel free to call the office or send a message through MyChart. You may also schedule an earlier appointment if necessary.  Additionally, you may be receiving a survey about your experience at our office within a few days to 1 week by e-mail or mail. We value your feedback.  Saralyn Pilar, DO [facility], [place]

## 2022-06-17 NOTE — Progress Notes (Signed)
Virtual Visit via Telephone The purpose of this virtual visit is to provide medical care while limiting exposure to the novel coronavirus (COVID19) for both patient and office staff.  Consent was obtained for phone visit:  Yes.   Answered questions that patient had about telehealth interaction:  Yes.   I discussed the limitations, risks, security and privacy concerns of performing an evaluation and management service by telephone. I also discussed with the patient that there may be a patient responsible charge related to this service. The patient expressed understanding and agreed to proceed.  Patient Location: Work Provider Location: [employer] (Office)  Participants in virtual visit: - Patient: Tara Baldwin - CMA: Burnell Blanks, CMA - Provider: Dr Althea Charon  ---------------------------------------------------------------------- Chief Complaint  Patient presents with   Abdominal Pain    S: Reviewed CMA documentation. I have called patient and gathered additional HPI as follows:  Constipation / Functional Abdominal Pain  Woke up 1.5 weeks ago, seems to have recurrence of the abdominal pain. In the past it would linger short amount of time in the morning and now it has been more persistent throughout the day.  She is trying to monitor her diet prior to sleep and evaluate for possible triggers. Past 2 mornings she has been okay but still can occur It seems the frequency is persistent but the intensity has increased.  1 year ago approx 07/2021 I saw her for this same problem, she described it was present for 5 years or more prior to that discussion.  She was referred to [facility] to Dr Servando Snare 07/2021 she did well with miralax treatment generic, and he talked about variety of other meds and options but she was middle of pregnancy and could not do these options.  She is currently breastfeeding, had new baby March 2023  Taking Tylenol PRN  She was not told  about any pregnancy related condition that impacted her symptoms    Past Medical History:  Diagnosis Date   Diastasis recti 08/20/2018   Headache    STD (female) 05/16/2017   chlmydia   Social History   Tobacco Use   Smoking status: Never    Passive exposure: Past   Smokeless tobacco: Never  Vaping Use   Vaping Use: Never used  Substance Use Topics   Alcohol use: Not Currently    Comment: occasional    Drug use: No   No current outpatient medications on file.     07/11/2021    8:49 AM 05/18/2021   11:01 AM 04/23/2021    2:41 PM  Depression screen PHQ 2/9  Decreased Interest 0 0 0  Down, Depressed, Hopeless 0 0 0  PHQ - 2 Score 0 0 0  Altered sleeping 3  1  Tired, decreased energy 1  1  Change in appetite 0  0  Feeling bad or failure about yourself  0  0  Trouble concentrating 0  0  Moving slowly or fidgety/restless 0  0  Suicidal thoughts 0  0  PHQ-9 Score 4  2  Difficult doing work/chores Somewhat difficult  Not difficult at all       01/17/2022    4:17 PM 07/11/2021    8:49 AM  GAD 7 : Generalized Anxiety Score  Nervous, Anxious, on Edge 0 0  Control/stop worrying  0  Worry too much - different things 0 0  Trouble relaxing 0 0  Restless 0 0  Easily annoyed or irritable 1 0  Afraid -  awful might happen 0 0  Total GAD 7 Score  0  Anxiety Difficulty Not difficult at all Not difficult at all    -------------------------------------------------------------------------- O: No physical exam performed due to remote telephone encounter.  Lab results reviewed.  No results found for this or any previous visit (from the past 2160 hour(s)).  -------------------------------------------------------------------------- A&P:  Problem List Items Addressed This Visit   None Visit Diagnoses     Functional bowel disorder    -  Primary   Slow transit constipation       Abdominal cramping       Chronic abdominal pain          Chronic functional bowel pain >6  years now, episodic AM usually Has had history of constipation, improved w/ treatment in past Now more intensity of pain Discussion on further more advanced GI work up and management.  Already seen GI < 1 year ago 08/2021, limited due to pregnancy at that time.  I recommend again that she return to GI for further management of this problem. She likely needs advanced testing including lab / CT / future colonoscopy  Please call Dr Servando Snare this week at [facility]) [address] [place], [place] [address] Main: [phone]  We discussed Bentyl (Dicyclomine) as an option for bowel spasms and abdominal pain, but it is not safe to take if you are breastfeeding.  Please discuss further with GI for considering testing and treatment options.  No orders of the defined types were placed in this encounter.   Follow-up: - Return as needed  Patient verbalizes understanding with the above medical recommendations including the limitation of remote medical advice.  Specific follow-up and call-back criteria were given for patient to follow-up or seek medical care more urgently if needed.   - Time spent in direct consultation with patient on phone: 10 minutes  Saralyn Pilar, DO [facility] 06/17/2022, 5:05 PM

## 2022-06-17 NOTE — Telephone Encounter (Signed)
Current paperwork has not yet been completed and is still in FMLA stack to be processed, therefore no paperwork has been signed or filled out yet.

## 2022-06-20 NOTE — Telephone Encounter (Signed)
I was handed and copy of up to date FMLA forms. I called patient to see if she wanted copies for her records. Patient advised she would come by today and pick them up.

## 2022-06-20 NOTE — Telephone Encounter (Signed)
Patient is calling to follow up on missing physician statement and FMLA process where we are at. Please contact patient. Thank you!

## 2022-06-25 ENCOUNTER — Ambulatory Visit: Payer: Managed Care, Other (non HMO)

## 2022-07-09 ENCOUNTER — Ambulatory Visit: Payer: Managed Care, Other (non HMO) | Attending: Obstetrics and Gynecology

## 2022-07-09 DIAGNOSIS — R279 Unspecified lack of coordination: Secondary | ICD-10-CM | POA: Diagnosis present

## 2022-07-09 DIAGNOSIS — M6281 Muscle weakness (generalized): Secondary | ICD-10-CM | POA: Diagnosis present

## 2022-07-09 DIAGNOSIS — R293 Abnormal posture: Secondary | ICD-10-CM | POA: Insufficient documentation

## 2022-07-09 DIAGNOSIS — M6208 Separation of muscle (nontraumatic), other site: Secondary | ICD-10-CM | POA: Insufficient documentation

## 2022-07-09 NOTE — Therapy (Signed)
OUTPATIENT PHYSICAL THERAPY FEMALE PELVIC DISCHARGE   Patient Name: Tara Baldwin MRN: [id number] DOB:[birth year], 31 y.o., female Today's Date: 07/09/2022   PT End of Session - 07/09/22 0801     Visit Number 7    Number of Visits 12    Date for PT Re-Evaluation 07/09/22    Authorization Type IE: 04/16/22    PT Start Time 0800    PT Stop Time 0840    PT Time Calculation (min) 40 min    Activity Tolerance Patient tolerated treatment well             Past Medical History:  Diagnosis Date   Diastasis recti 08/20/2018   Headache    STD (female) 05/16/2017   chlmydia   Past Surgical History:  Procedure Laterality Date   CESAREAN SECTION N/A 12/19/2017   Procedure: CESAREAN SECTION;  Surgeon: Brayton Mars, MD;  Location: [facility] ORS;  Service: Obstetrics;  Laterality: N/A;   Patient Active Problem List   Diagnosis Date Noted   Diastasis recti 08/20/2018   Breast lump on right side at 11 o'clock position 10/09/2017    PCP: Nobie Putnam, MD  REFERRING PROVIDER: Rubie Maid, MD   REFERRING DIAG:  M62.08 (ICD-10-CM) - Diastasis recti   THERAPY DIAG:  Muscle weakness (generalized)  Unspecified lack of coordination  Separation of muscle (nontraumatic), other site  Abnormal posture  Rationale for Evaluation and Treatment: Rehabilitation  ONSET DATE: After 2nd pregnancy  PRECAUTIONS: None  WEIGHT BEARING RESTRICTIONS: No  FALLS:  Has patient fallen in last 6 months? No  OCCUPATION/SOCIAL ACTIVITIES: [employer] up on her feet, traveling  PLOF: Independent   CHIEF CONCERN: Pt has talked to Dr. Marcelline Mates about postpartum anxiety. Dr recommended SAMe but Pt said it was expensive and was referred to a psychiatrist but is hesitant about taking medication. Pt has been able to take more time off from work and spend time at home with family. Pt feels this has helped and will look into talk therapy if things get worse. Pt is breastfeeding for the first  time but is pumping, as baby is not latching on. Pt has been to PFPT before due to having a DRA with first born child. Pt now returns for concerns of returning diastasis. 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 Hx of low back pain but none currently. Pt does have mid-back pain when pumping and feels she has position her body at different angles to completely pump breast milk out.    PATIENT GOALS: Pt would like to close diastasis recti and be in control of her body and core again.    SEXUAL HISTORY/FUNCTION  Pain with intercourse: Initial Penetration Ability to have vaginal penetration:  Yes: have to stop due to pain , burning sensation, 9/10  Able to achieve orgasm?: Yes   OBSTETRICAL HISTORY Vaginal deliveries: G2P2 Tearing: Yes: minimal no stitches  C-section deliveries: 1st delivery  Currently pregnant: No  GYNECOLOGICAL HISTORY Hysterectomy: no Pelvic Organ Prolapse: None Pain with exam: yes, burning  Heaviness/pressure: yes no   SUBJECTIVE:  Pt has been having a lot of external stressors at work as well as the natural demand of motherhood. Pt has been having a pain in her upper abdomen. Pt has an appt with a GI doctor to figure out where this pain is coming from. Pt only notices it when she wakes up in the morning.    PAIN:  Are you having pain? Yes NPRS  Scale: R ankle 7/10    OBJECTIVE:   DIAGNOSTIC TESTING/IMPRESSIONS:    COGNITION: Overall cognitive status: Within functional limits for tasks assessed     POSTURE:  Lumbar lordosis: increased in sitting and standing   Iliac crest height: WNL Pelvic obliquity: WNL   RANGE OF MOTION:    (Norm range in degrees)  LEFT 04/16/22 RIGHT 04/16/22  Lumbar forward flexion (65):  WNL    Lumbar extension (30): WNL    Lumbar lateral flexion (25):  WNL WNL  Thoracic and Lumbar rotation (30 degrees):    WNL WNL  Hip Flexion (0-125):   WNL WNL  Hip IR (0-45):  WNL WNL  Hip ER  (0-45):  WNL WNL  Hip Adduction:   WNL WNL  Hip Abduction (0-40):  WNL WNL  Hip extension (0-15):  WNL WNL  (*= pain, Blank rows = not tested)   STRENGTH: MMT    RLE  LLE   Hip Flexion 4* 4*  Hip Extension 4 4  Hip Abduction     Hip Adduction     Hip ER  4*  4*  Hip IR  5 5  Knee Extension 4* 4*  Knee Flexion 4 4  Dorsiflexion     Plantarflexion (seated) 5 5  (*= fasciculations, Blank rows = not tested)   SPECIAL TESTS:  FABER (SN 81): negative B FADIR (SN 94): negative B     PALPATION:  Abdominal:  Diastasis: 3 finger above umbilicus with DIP depth, 3 fingers at and below umbilicus with DIP depth, observed doming  Scar mobility: present, slight discomfort at scar on the R side with scar restriction noted    EXTERNAL PELVIC EXAM: Patient educated on the purpose of the pelvic exam and articulated understanding; patient consented to the exam verbally.  Breath coordination: present but inconsistent Voluntary Contraction: present, 3/5 MMT Relaxation: delayed Perineal movement with sustained IAP increase ("bear down"): elevation Perineal movement with rapid IAP increase ("cough"): no change (0= no contraction, 1= flicker, 2= weak squeeze, 3= fair squeeze with lift, 4= good squeeze and lift against resistance, 5= strong squeeze against strong resistance)    TODAY'S TREATMENT:   Neuromuscular Re-education Supine hooklying diaphragmatic breathing with VCs and TCs for downregulation of the nervous system and improved management of IAP  EXTERNAL PELVIC EXAM: Patient educated on the purpose of the pelvic exam and articulated understanding; patient consented to the exam verbally.  Breath coordination: present  Relaxation: full Perineal movement with sustained IAP increase ("bear down"): descent with Valsalva noted (0= no contraction, 1= flicker, 2= weak squeeze, 3= fair squeeze with lift, 4= good squeeze and lift against resistance, 5= strong squeeze against strong  resistance)   Abdominal:  Diastasis: 2.5 finger above umbilicus with improved depth, 2 fingers at umbilicus and 1.5 fingers below umbilicus with improved depth, observed doming but has improved significantly since IE in July    FOTO Reassessment for Discharge: FOTO PFDI Pain - 0    Discussion on continuing proper lifting mechanics/body mechanics with coordinated breath to avoid strain in the lumbar spine and increased pain.   Discussion on continuing deep core activation techniques as DRA has improved since IE. Pt verbalized understanding.    Patient response to interventions:  Pt comfortable to discharge    Patient Education:  Patient educated throughout session on appropriate technique and form using multi-modal cueing, HEP, and activity modification. Patient will continue to benefit from further education in order to maximize compliance and understanding for long-term therapeutic  gains.    ASSESSMENT:  Clinical Impression: Patient presents to clinic with excellent motivation to participate in today's discharge session. Pt has improved significantly since IE on 04/16/22 in regards to pain, posture, PFM coordination, PFM extensibility, and scar mobility. PFM external exam reveals consistent breath coordination with PFM, full PFM relaxation, and descent of PFM with sustained IAP increase ("bear down") compared to IE (delayed PFM relaxation, inconsistent breath coordination, and elevation of PFM with cue to "bear down"). Pt has also demonstrated improvement in DRA now: 2.5 finger above umbilicus with improved depth, 2 fingers at umbilicus and 1.5 fingers below umbilicus with improved depth. Pt continues to have doming due to [place] but has improved in amount since IE (3 finger above umbilicus with DIP depth, 3 fingers at and below umbilicus with DIP depth, observed doming). Pt has met 5/6 goals below and reveals no pain on FOTO PFDI Pain (0) compared to IE (13). Although Pt has improved  significantly, due to demanding nature of motherhood and external stressors, Pt unable to continue with PFPT at this time. Pt educated on continuing with TrA activation, breathing mechanics, and proper lifting techniques/body mechanics. In the future, Pt will benefit from further therapy in relation to [place] and IAP management. Pt given office contact information and DPT email if further questions arise after discharge.    Objective Impairments: decreased activity tolerance, decreased coordination, decreased endurance, decreased strength, increased fascial restrictions, improper body mechanics, postural dysfunction, and pain.   Activity Limitations: carrying, lifting, bending, squatting, locomotion level, and caring for others  Personal Factors: Behavior pattern, Past/current experiences, and Time since onset of injury/illness/exacerbation are also affecting patient's functional outcome.   Rehab Potential: Good  Clinical Decision Making: Evolving/moderate complexity  Evaluation Complexity: Moderate   GOALS: Goals reviewed with patient? Yes  SHORT TERM GOALS: Target date: 07/09/22  Patient will demonstrate independence with HEP in order to maximize therapeutic gains and improve carryover from physical therapy sessions to ADLs in the home and community. Baseline: supine diaphragmatic breathing; (10/3): IND with HEP Goal status: MET    LONG TERM GOALS: Target date: 07/09/22  Patient will be able to demonstrate improved abdominal strengthening with closure of the diastasis recti to 2 fingers or less in order to return to activities such as, but not limited to: physical activity and caring for young children without limitation or distress.  Baseline: 3 finger width/DIP depth above, at, and below.  Goal status: NOT MET  2.  Pt will be able to coordinate PFM relaxation and contraction and verbalize pain modulation/mindfulness techniques in order to reduce worst pain by at least 2 points and  participate in activities such as, but not limited to: penetrative sex and gynecological yearly exams.  Baseline: 9/10 with burning sensation, unable to continue with penetration due to pain; (8/21): 3/10 with penetrative sex; (10/3): 0/10 pain with penetrative sex  Goal status: MET   3.  Patient will be able to demonstrate improved postural alignment in standing and sitting and overall improved muscular strength in order to reduce bodily compensations from prior pregnancies and allow for a complete resolution of pelvic floor chief concern: DRA.  Baseline: increased lordosis sitting, standing, and supine; (10/3): equal alignment throughout in standing and sitting  Goal status: MET  4.  Patient will demonstrate independent and coordinated diaphragmatic breathing in supine with a 1:2 breathing pattern for improved down-regulation of the nervous system and improved management of intra-abdominal pressures in order to increase function at home and in the  community. Baseline: present but inconsistent, delayed relaxation after series of PFM activation during assessment; (10/3): present breath coordination, full PFM relaxation  Goal status: MET   PLAN: PT Frequency: 1x/week  PT Duration: 12 weeks  Planned Interventions: Therapeutic exercises, Therapeutic activity, Neuromuscular re-education, Balance training, Gait training, Patient/Family education, Joint mobilization, Spinal mobilization, Cryotherapy, Moist heat, scar mobilization, Taping, and Manual therapy    Antwuan Eckley, PT, DPT  07/09/2022, 9:36 AM

## 2022-07-10 ENCOUNTER — Encounter: Payer: Self-pay | Admitting: Gastroenterology

## 2022-07-10 ENCOUNTER — Ambulatory Visit (INDEPENDENT_AMBULATORY_CARE_PROVIDER_SITE_OTHER): Payer: Managed Care, Other (non HMO) | Admitting: Gastroenterology

## 2022-07-10 VITALS — BP 116/70 | HR 69 | Temp 98.5°F | Ht 66.0 in | Wt 171.0 lb

## 2022-07-10 DIAGNOSIS — G8929 Other chronic pain: Secondary | ICD-10-CM | POA: Diagnosis not present

## 2022-07-10 DIAGNOSIS — R1013 Epigastric pain: Secondary | ICD-10-CM

## 2022-07-10 MED ORDER — PANTOPRAZOLE SODIUM 40 MG PO TBEC: 40.0000 mg | DELAYED_RELEASE_TABLET | Freq: Every day | ORAL | 5 refills | Status: AC

## 2022-07-10 NOTE — Progress Notes (Signed)
    Primary Care Physician: Olin Hauser, DO  Primary Gastroenterologist:  Dr. Lucilla Lame  Chief Complaint  Patient presents with   Abdominal Pain    HPI: Tara Baldwin is a 31 y.o. female here for follow-up with a history of seeing me last year while she was pregnant with constipation.  At that time the patient had imaging that showed a significant mount of stool in her colon.  The patient was told to take fiber and MiraLAX and to see how that worked for the rest of the pregnancy and come see me after the pregnancy to consider further management of her constipation. The patient reports that her issue now is not so much for constipation when she has epigastric pain.  She states it is worse in the morning when she wakes up.  She also reports that she has a lot of stress at work.   Past Medical History:  Diagnosis Date   Diastasis recti 08/20/2018   Headache    STD (female) 05/16/2017   chlmydia    No current outpatient medications on file.   No current facility-administered medications for this visit.    Allergies as of 07/10/2022   (No Known Allergies)    ROS:  General: Negative for anorexia, weight loss, fever, chills, fatigue, weakness. ENT: Negative for hoarseness, difficulty swallowing , nasal congestion. CV: Negative for chest pain, angina, palpitations, dyspnea on exertion, peripheral edema.  Respiratory: Negative for dyspnea at rest, dyspnea on exertion, cough, sputum, wheezing.  GI: See history of present illness. GU:  Negative for dysuria, hematuria, urinary incontinence, urinary frequency, nocturnal urination.  Endo: Negative for unusual weight change.    Physical Examination:   BP 116/70 (BP Location: Left Arm, Patient Position: Sitting, Cuff Size: Normal)   Pulse 69   Temp 98.5 F (36.9 C) (Oral)   Ht 5\' 6"  (1.676 m)   Wt 171 lb (77.6 kg)   BMI 27.60 kg/m   General: Well-nourished, well-developed in no acute distress.  Eyes: No icterus.  Conjunctivae pink. Lungs: Clear to auscultation bilaterally. Non-labored. Heart: Regular rate and rhythm, no murmurs rubs or gallops.  Abdomen: Bowel sounds are normal, nontender, nondistended, no hepatosplenomegaly or masses, no abdominal bruits or hernia , no rebound or guarding.   Extremities: No lower extremity edema. No clubbing or deformities. Neuro: Alert and oriented x 3.  Grossly intact. Skin: Warm and dry, no jaundice.   Psych: Alert and cooperative, normal mood and affect.  Labs:    Imaging Studies: No results found.  Assessment and Plan:   Tara Baldwin is a 31 y.o. y/o female who comes in today with a history of constipation that is well controlled on her present medications.  The patient does report epigastric discomfort usually in the morning.  The patient will be started on pantoprazole in the evening to see if the nausea is caused by nocturnal acid reflux.  The patient has been told to give it 3 to 4 weeks and if it is not working she should reach out to me and possibly be started on an antispasmodic medication.  The patient has been explained the plan and agrees with it.     Lucilla Lame, MD. [facility] Regal    Note: This dictation was prepared with Dragon dictation along with smaller phrase technology. Any transcriptional errors that result from this process are unintentional.

## 2022-07-15 ENCOUNTER — Encounter: Payer: Self-pay | Admitting: Obstetrics and Gynecology

## 2022-07-22 ENCOUNTER — Encounter: Payer: Self-pay | Admitting: Gastroenterology

## 2022-10-16 ENCOUNTER — Telehealth (INDEPENDENT_AMBULATORY_CARE_PROVIDER_SITE_OTHER): Payer: Managed Care, Other (non HMO) | Admitting: Family Medicine

## 2022-10-16 ENCOUNTER — Encounter: Payer: Self-pay | Admitting: Family Medicine

## 2022-10-16 DIAGNOSIS — L219 Seborrheic dermatitis, unspecified: Secondary | ICD-10-CM | POA: Diagnosis not present

## 2022-10-16 MED ORDER — CICLOPIROX 1 % EX SHAM: MEDICATED_SHAMPOO | CUTANEOUS | 1 refills | Status: AC

## 2022-10-16 NOTE — Patient Instructions (Addendum)
   Please schedule a Follow-up Appointment to: Return if symptoms worsen or fail to improve.  If you have any other questions or concerns, please feel free to call the office or send a message through MyChart. You may also schedule an earlier appointment if necessary.  Additionally, you may be receiving a survey about your experience at our office within a few days to 1 week by e-mail or mail. We value your feedback.  Dian Minahan, DO [facility], [facility] 

## 2022-10-16 NOTE — Progress Notes (Signed)
Subjective:    Patient ID: Tara Baldwin, female    DOB: 10/18/1990, 32 y.o.   MRN: [id number]  Tara Baldwin is a 32 y.o. female presenting on 10/16/2022 for No chief complaint on file.  Virtual / Telehealth Encounter - Video Visit via MyChart The purpose of this virtual visit is to provide medical care while limiting exposure to the novel coronavirus (COVID19) for both patient and office staff.  Consent was obtained for remote visit:  Yes.   Answered questions that patient had about telehealth interaction:  Yes.   I discussed the limitations, risks, security and privacy concerns of performing an evaluation and management service by video/telephone. I also discussed with the patient that there may be a patient responsible charge related to this service. The patient expressed understanding and agreed to proceed.  Patient Location: Home Provider Location: [facility] (Office)  Participants in virtual visit: - Patient: Tara Baldwin - CMA: Orinda Kenner, CMA - Provider: Dr Parks Ranger   HPI  Scalp Dandruff vs Scalp Psoriasis Reports recent changes to scalp health with some increased dry scalp and skin irritation with now significant dry scalp flaking. Even tried scalp treatment from hair stylist. Has removed all of the large flakes now and using aggressive oil moisturizing regimen with some success but still interested in treatment. She has not seen dermatologist for this problem. Or tried any topicals or rx medication or shampoos     07/11/2021    8:49 AM 05/18/2021   11:01 AM 04/23/2021    2:41 PM  Depression screen PHQ 2/9  Decreased Interest 0 0 0  Down, Depressed, Hopeless 0 0 0  PHQ - 2 Score 0 0 0  Altered sleeping 3  1  Tired, decreased energy 1  1  Change in appetite 0  0  Feeling bad or failure about yourself  0  0  Trouble concentrating 0  0  Moving slowly or fidgety/restless 0  0  Suicidal thoughts 0  0  PHQ-9 Score 4  2  Difficult doing  work/chores Somewhat difficult  Not difficult at all    Social History   Tobacco Use   Smoking status: Never    Passive exposure: Past   Smokeless tobacco: Never  Vaping Use   Vaping Use: Never used  Substance Use Topics   Alcohol use: Not Currently    Comment: occasional    Drug use: No    Review of Systems Per HPI unless specifically indicated above     Objective:    There were no vitals taken for this visit.  Wt Readings from Last 3 Encounters:  07/10/22 171 lb (77.6 kg)  06/17/22 167 lb (75.8 kg)  06/06/22 167 lb 12.8 oz (76.1 kg)    Physical Exam  Note examination was completely remotely via video observation objective data only  Gen - well-appearing, no acute distress or apparent pain, comfortable HEENT - eyes appear clear without discharge or redness Heart/Lungs - cannot examine virtually - observed no evidence of coughing or labored breathing. Abd - cannot examine virtually  Skin - face visible today- no rash. Scalp difficult to visualize on video Neuro - awake, alert, oriented   Results for orders placed or performed in visit on 03/05/22  Hemoglobin and hematocrit, blood  Result Value Ref Range   Hemoglobin 12.4 11.1 - 15.9 g/dL   Hematocrit 36.7 34.0 - 46.6 %      Assessment & Plan:   Problem List Items Addressed This Visit  None Visit Diagnoses     Seborrheic dermatitis of scalp    -  Primary   Relevant Medications   Ciclopirox 1 % shampoo       Clinically based on history - consistent with seborrheic dermatitis of scalp with dandruff  Trial on Ciclopirox 1% shampoo anti fungal course as prescribed  She may still use some moisturizing treatment but can rely on it less and see if this resolves the issue.  If inadequate resolution, we can follow up in person for eval, consider possible diagnosis of scalp psoriasis and other topical steroid treatment vs referral to Dermatology   Meds ordered this encounter  Medications   Ciclopirox 1 %  shampoo    Sig: Apply ~5 mL to wet hair; lather, and leave on hair and scalp for ~3 minutes; rinse. May use up to 10 mL for longer hair. Repeat twice weekly for 4 weeks; allow a minimum of 3 days between applications. Use for up to 4 weeks    Dispense:  120 mL    Refill:  1      Follow up plan: Return if symptoms worsen or fail to improve.    Patient verbalizes understanding with the above medical recommendations including the limitation of remote medical advice.  Specific follow-up and call-back criteria were given for patient to follow-up or seek medical care more urgently if needed.  Total duration of direct patient care provided via video conference: 15 minutes   Nobie Putnam, [facility] 10/16/2022, 4:19 PM

## 2023-01-06 ENCOUNTER — Other Ambulatory Visit: Payer: Self-pay | Admitting: Gastroenterology

## 2023-05-08 IMAGING — US US OB COMP LESS 14 WK
1 series · 14 of 28 positions shown · non-contrast
Comparison: None.

CLINICAL DATA: Dating, viability

EXAM:
OBSTETRIC <14 WK ULTRASOUND
TECHNIQUE: Transabdominal ultrasound was performed for evaluation of the
gestation as well as the maternal uterus and adnexal regions.

[Series 1: us ob comp less 14 wk · 0.15mm/px · 50 acquisitions, 14 frames shown]
[im 2/50]
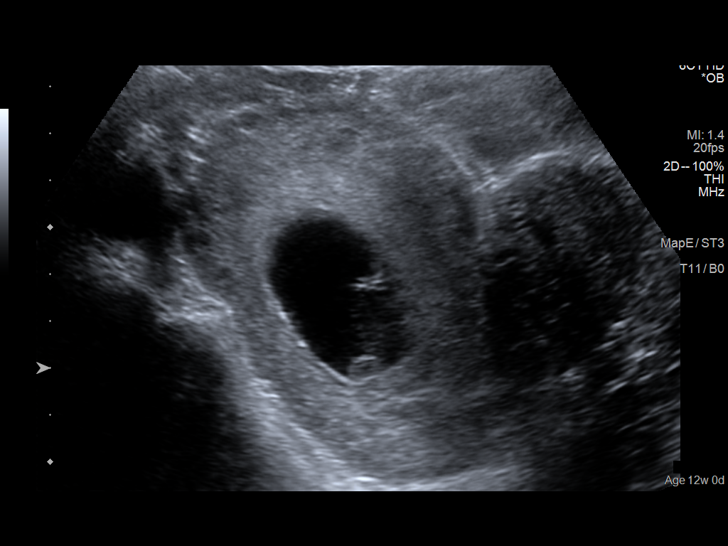
[im 6/50]
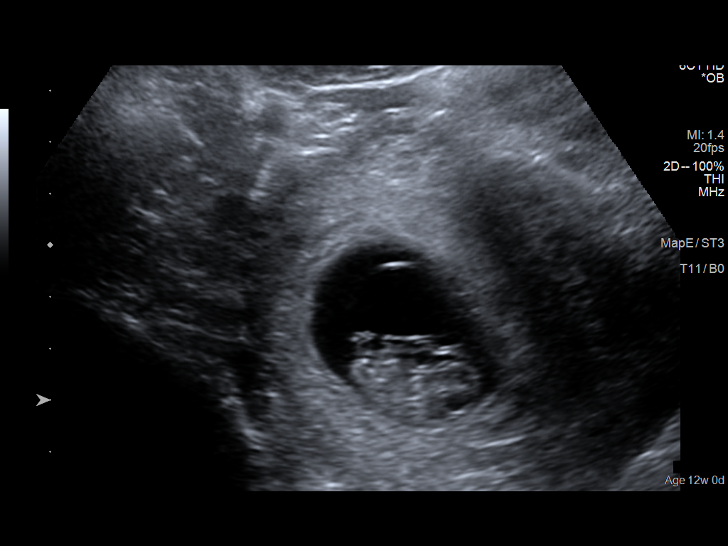
[im 10/50]
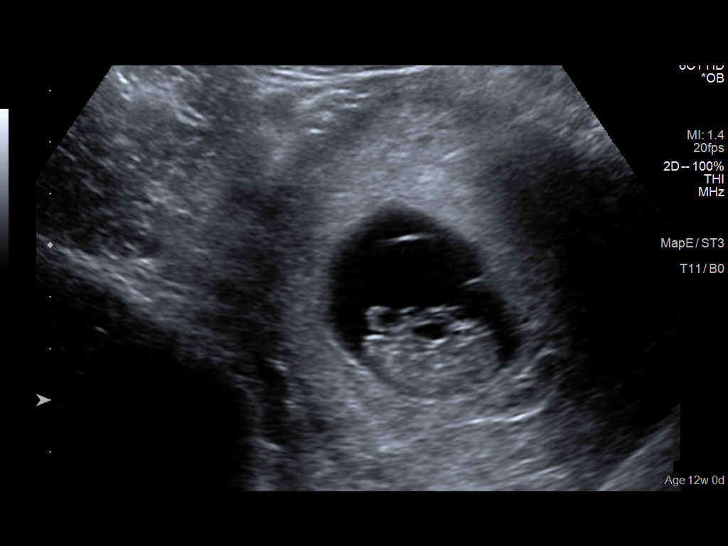
[im 13/50]
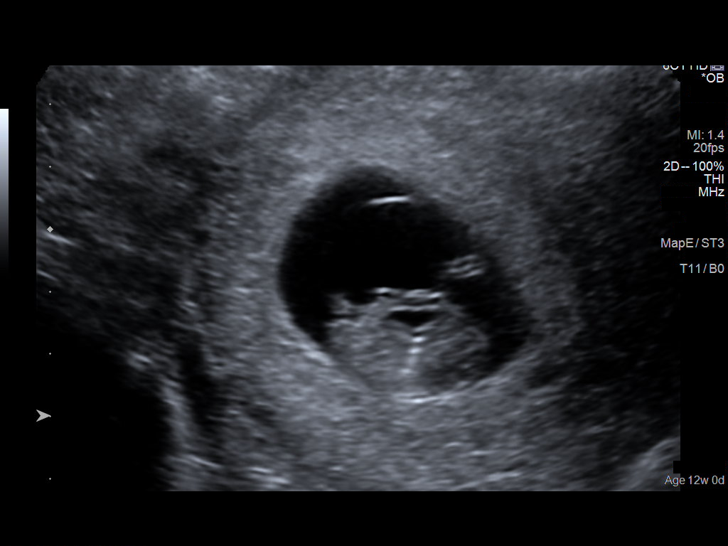
[im 17/50]
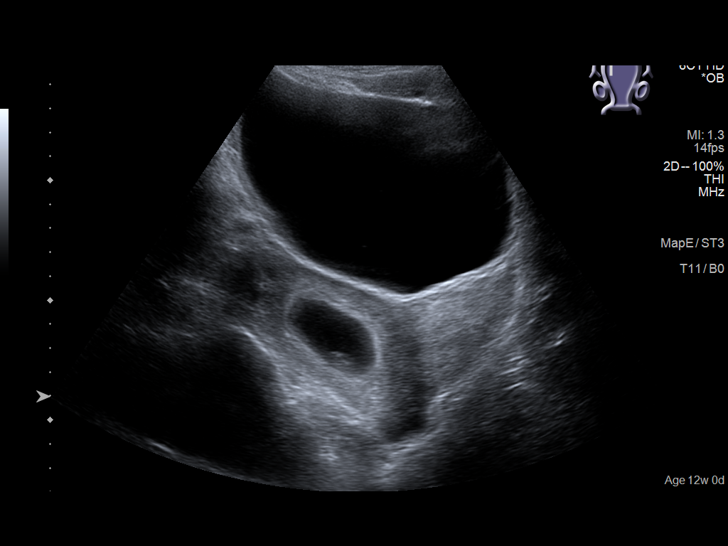
[im 20/50]
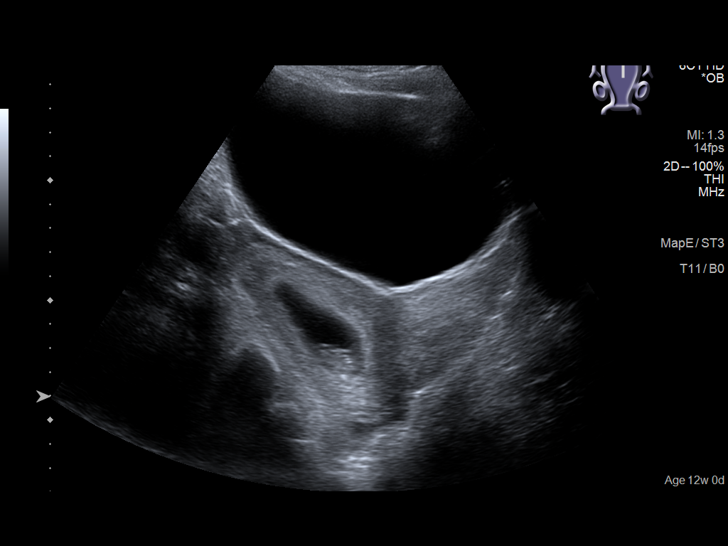
[im 24/50]
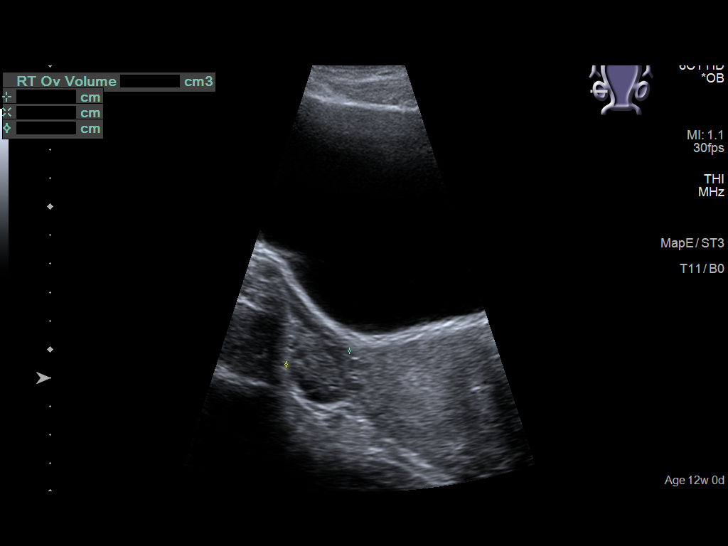
[im 28/50]
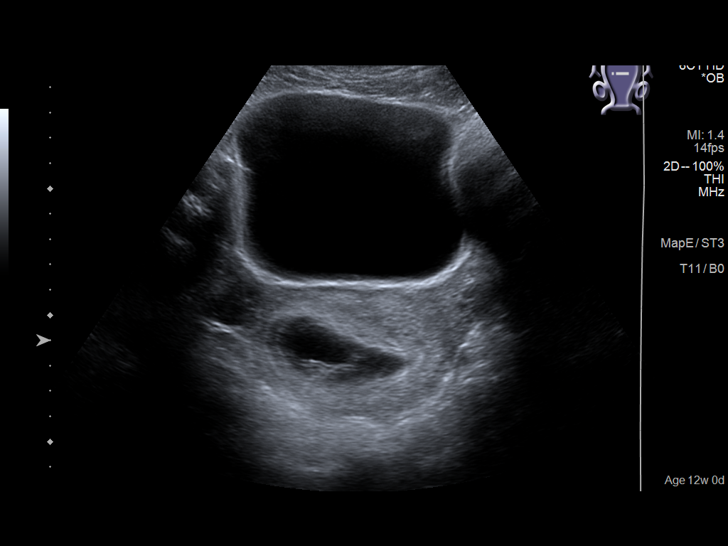
[im 31/50]
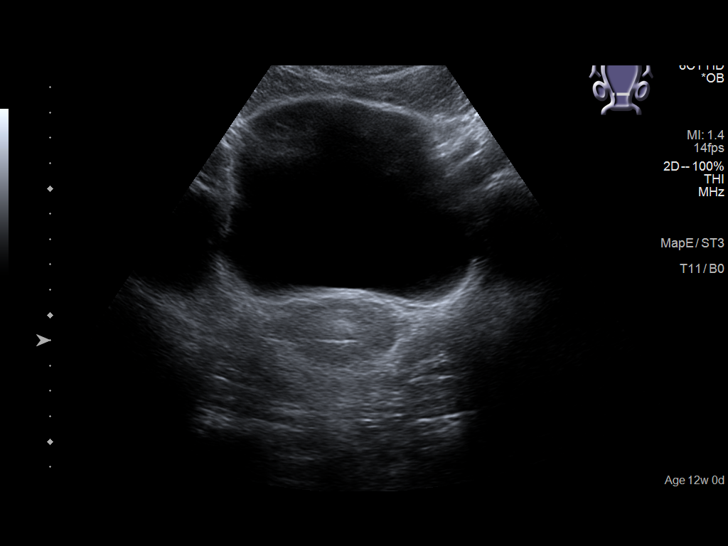
[im 35/50]
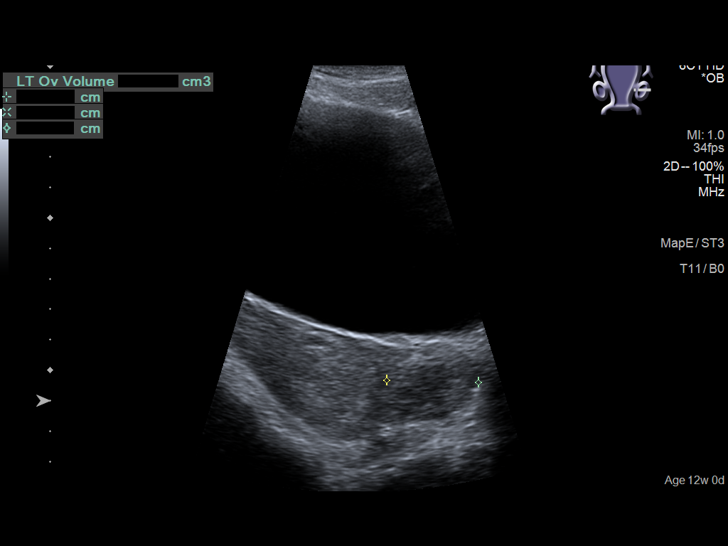
[im 39/50]
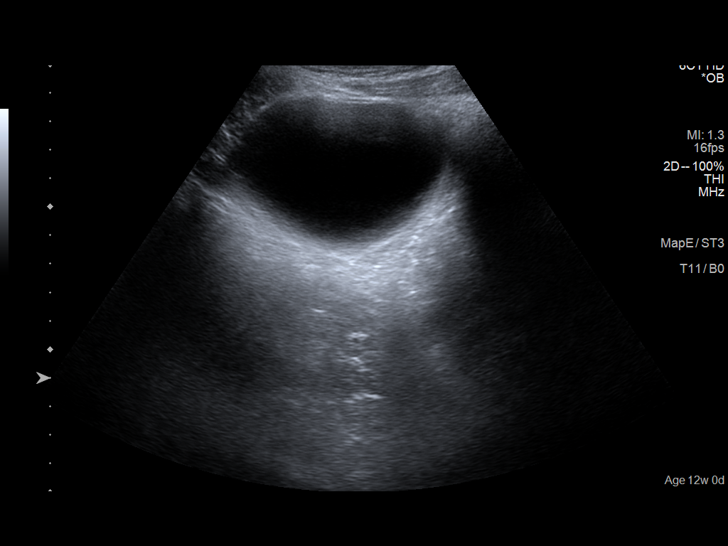
[im 42/50]
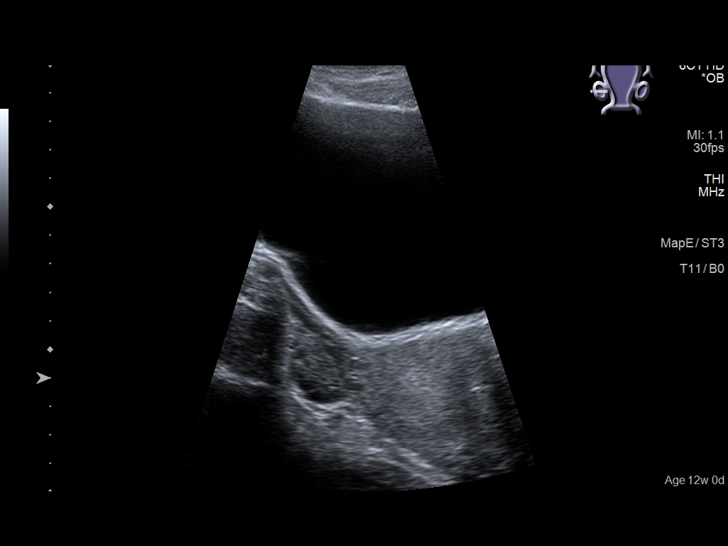
[im 46/50]
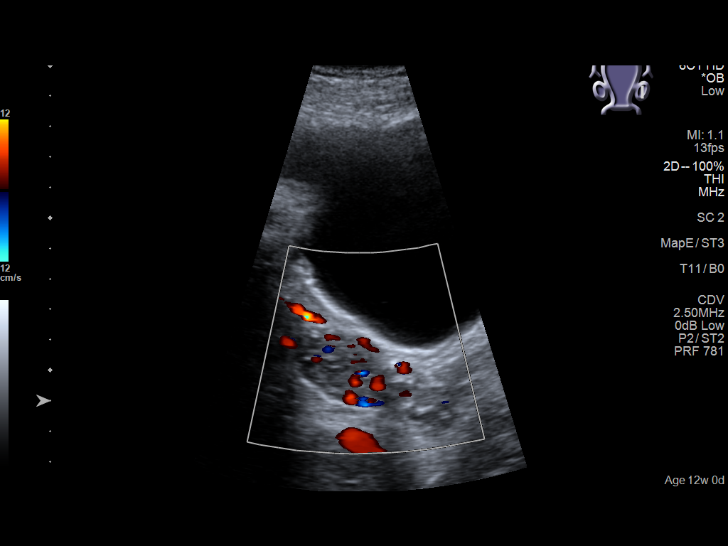
[im 50/50]
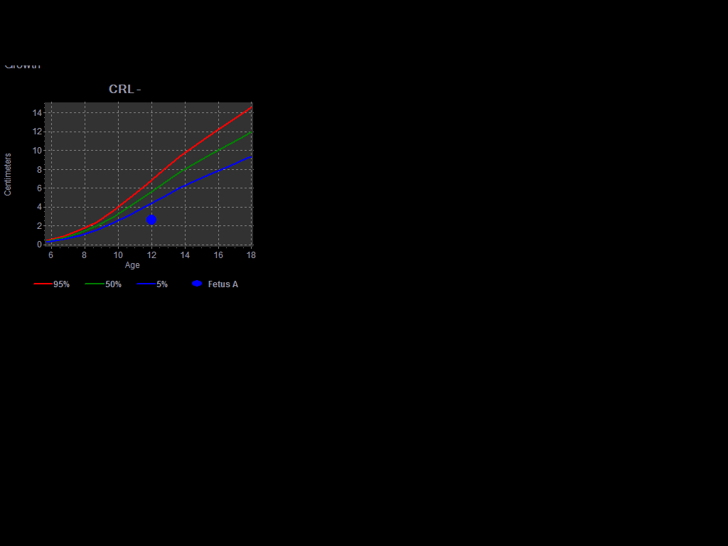

[14 of 28 positions shown; findings below may reference images not displayed]

FINDINGS: Intrauterine gestational sac: Single

Yolk sac:  Visualized.

Embryo:  Visualized.

Cardiac Activity: Visualized.

Heart Rate: 180 bpm

MSD:    mm    w     d

CRL:   26.4 mm   9 w 3 d                  US EDC: 12/29/2021

Subchorionic hemorrhage:  None visualized.

Maternal uterus/adnexae: No adnexal mass or free fluid
IMPRESSION: Nine week 3 day intrauterine pregnancy. Fetal heart rate 180 beats
per minute. No acute maternal findings.
